# Patient Record
Sex: Male | Born: 1945
Health system: Southern US, Community
[De-identification: ages and names within clinical notes are randomized; demographics above are authoritative.]

## PROBLEM LIST (undated history)

## (undated) DIAGNOSIS — E119 Type 2 diabetes mellitus without complications: Secondary | ICD-10-CM

## (undated) DIAGNOSIS — Z951 Presence of aortocoronary bypass graft: Secondary | ICD-10-CM

## (undated) DIAGNOSIS — I2510 Atherosclerotic heart disease of native coronary artery without angina pectoris: Secondary | ICD-10-CM

## (undated) DIAGNOSIS — E785 Hyperlipidemia, unspecified: Secondary | ICD-10-CM

## (undated) DIAGNOSIS — Z8719 Personal history of other diseases of the digestive system: Secondary | ICD-10-CM

## (undated) DIAGNOSIS — I10 Essential (primary) hypertension: Secondary | ICD-10-CM

## (undated) HISTORY — DX: Atherosclerotic heart disease of native coronary artery without angina pectoris: I25.10

## (undated) HISTORY — PX: KNEE ARTHROSCOPY: SUR90

## (undated) HISTORY — DX: Essential (primary) hypertension: I10

## (undated) HISTORY — DX: Hyperlipidemia, unspecified: E78.5

## (undated) HISTORY — DX: Type 2 diabetes mellitus without complications: E11.9

---

## 2013-09-15 ENCOUNTER — Encounter: Payer: Self-pay | Admitting: Cardiovascular Disease

## 2013-09-15 ENCOUNTER — Ambulatory Visit (INDEPENDENT_AMBULATORY_CARE_PROVIDER_SITE_OTHER): Payer: Medicare Other | Admitting: Cardiovascular Disease

## 2013-09-15 VITALS — BP 150/80 | HR 67 | Ht 74.0 in | Wt 253.9 lb

## 2013-09-15 DIAGNOSIS — E785 Hyperlipidemia, unspecified: Secondary | ICD-10-CM

## 2013-09-15 DIAGNOSIS — R079 Chest pain, unspecified: Secondary | ICD-10-CM

## 2013-09-15 DIAGNOSIS — I10 Essential (primary) hypertension: Secondary | ICD-10-CM

## 2013-09-15 DIAGNOSIS — R0789 Other chest pain: Secondary | ICD-10-CM

## 2013-09-15 DIAGNOSIS — E119 Type 2 diabetes mellitus without complications: Secondary | ICD-10-CM

## 2013-09-15 NOTE — Patient Instructions (Signed)
Your physician has requested that you have an echocardiogram. Echocardiography is a painless test that uses sound waves to create images of your heart. It provides your doctor with information about the size and shape of your heart and how well your heart's chambers and valves are working. This procedure takes approximately one hour. There are no restrictions for this procedure.   Your physician recommends that you return for lab work fasting before your next appointment.  Your physician recommends that you schedule a follow-up appointment in: 3-4 weeks.

## 2013-09-15 NOTE — Progress Notes (Signed)
Patient ID: William Orr, male   DOB: 1945-11-18, 68 y.o.   MRN: [id number]     PATIENT PROFILE: William Orr is a 68 year old African American male who presents to the office to establish cardiology care with me and to further evaluate his recent left-sided chest pain.   HPI: William Orr is a 68 year old gentleman who is the wife of my patient Ms. Tollie Pizza are [employer]. Apparently William Orr has a greater than 20 year history of hypertension as well as type 2 diabetes mellitus in addition to hyperlipidemia. He recently had developed some what atypical chest pain and underwent an exercise nuclear study which was done in [place]. He was told that this was normal. I was able to obtain the results by fax today and apparently this confirmed a normal nuclear scan with an ejection fraction of 61%. There is no evidence for ischemia. It normal wall motion. He was hypertensive throughout the study.  William Orr has continued to experience left-sided chest pain. He states his pain often times is worse if he coughs or moves. It does not typically be precipitated by activity. He does note chest wall tenderness.  He also has a history of a hiatal hernia.  He has maintained on blood pressure medicines consisting of diltiazem 300 mg daily, carvedilol 25 mg twice a day, losartan HCT 100/25 mg. He takes simvastatin 20 mg for hyperlipidemia. He takes Glucophage 850 mg in the morning and 2 tablets at night. He also takes fish oil. He presents for evaluation.  Past Medical History  Diagnosis Date  . Hypertension   . Diabetes mellitus without complication   . Hyperlipidemia     History reviewed. No pertinent past surgical history.  No Known Allergies  Current Outpatient Prescriptions  Medication Sig Dispense Refill  . carvedilol (COREG) 25 MG tablet Take 1 tablet by mouth 2 (two) times daily.      . Cholecalciferol (VITAMIN D-3) 5000 UNITS TABS Take 1 tablet by mouth daily.      [name] diltiazem  (TIAZAC) 300 MG 24 hr capsule Take 300 mg by mouth daily.      . fluticasone (FLONASE) 50 MCG/ACT nasal spray Place 1 spray into both nostrils daily.      [name] losartan-hydrochlorothiazide (HYZAAR) 100-25 MG per tablet Take 1 tablet by mouth daily.      . metFORMIN (GLUCOPHAGE) 850 MG tablet Take by mouth. 1 tablet in the morning and 2 tablets in thePM      . Multiple Vitamins-Minerals (MULTIVITAMIN PO) Take 1 capsule by mouth daily.      . Omega-3 1000 MG CAPS Take 1 capsule by mouth 3 (three) times daily.      [name] OVER THE COUNTER MEDICATION Take 1 tablet by mouth 2 (two) times daily. superbeta  prostate      . simvastatin (ZOCOR) 20 MG tablet Take 1 tablet by mouth daily.       No current facility-administered medications for this visit.    Social history is notable he is married for 44 years. He has 2 children and 3 grandchildren. 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 There is no history of tobacco use. There is no alcohol use.  Family History  Problem Relation Age of Onset  . Diabetes Mother   . Cancer - Prostate Father   . Rheum arthritis Sister   . Diabetes Brother   . Cancer - Other Brother  pancreatic  . Rheum arthritis Brother   . Hypertension Son   . Diabetes type II Son     ROS is negative for fever chills or night sweats. He denies skin rash. He denies change in vision or hearing. He denies PND and orthopnea. There is no wheezing or increased sputum production. He does note chest wall discomfort and discomfort when he moves his left arm. He denies exertionally precipitated chest tightness. There is no nausea vomiting or diarrhea. He is unaware of blood in stool or urine. He denies claudication. He denies GU symptoms. He is unaware of blood in his. There is no claudication. He denies significant leg swelling. There is an almost 25 year history of diabetes mellitus. Other comprehensive 14  point system review is negative.  PE BP 150/80  Pulse 67  Ht 6\' 2"  (1.88 m)  Wt 253 lb 14.4 oz (115.168 kg)  BMI 32.58 kg/m2 General: Alert, oriented, no distress.  Skin: normal turgor, no rashes HEENT: Normocephalic, atraumatic. Pupils round and reactive; sclera anicteric; extraocular muscles intact; Fundi mild arterial narrowing without hemorrhages or exudates. No xanthelasmas. Nose without nasal septal hypertrophy Mouth/Parynx benign; Mallinpatti scale 3 Neck: No JVD, no carotid bruits; normal carotid upstroke Lungs: clear to ausculatation and percussion; no wheezing or rales Chest wall: Definite tenderness to palpation below the left clavicle in the region of the pectoralis muscle extending to the left shoulder. Tender to palpation. Heart: RRR, s1 s2 normal 1/6 systolic murmur. No S3 gallop. No rub or heaves to Abdomen: soft, nontender; no hepatosplenomehaly, BS+; abdominal aorta nontender and not dilated by palpation. Back: no CVA tenderness Pulses 2+ Extremities: no clubbinbg cyanosis or edema, Homan's sign negative  Neurologic: grossly nonfocal; Cranial nerves grossly wnl Psychologic: Normal mood and affect    ECG (independently read by me): Sinus rhythm at 67 beats per minute with borderline first-degree block with PR interval 212 ms.   LABS:  BMET No results found for this basename: na, k, cl, co2, glucose, bun, creatinine, calcium, gfrnonaa, gfraa     Hepatic Function Panel  No results found for this basename: prot, albumin, ast, alt, alkphos, bilitot, bilidir, ibili     CBC No results found for this basename: wbc, rbc, hgb, hct, plt, mcv, mch, mchc, rdw, neutrabs, lymphsabs, monoabs, eosabs, basosabs     BNP No results found for this basename: probnp    Lipid Panel  No results found for this basename: chol, trig, hdl, cholhdl, vldl, ldlcalc     RADIOLOGY: No results found.   ASSESSMENT AND PLAN: William Orr is a 68 year old African American male  who has cardiac risk factor profile notable for a greater than twenty-year history of hypertension and diabetes mellitus. There is a family history for coronary artery disease with his mother dying from myocardial infarction and who also diabetes mellitus at age 42. I did review the [facility]. His nuclear perfusion study was nonischemic and showed fairly normal perfusion without scar or ischemia. I suspect his chest pain is musculoskeletal in etiology. With his diabetes mellitus, hypertensive history I am recommending a 2-D echo Doppler study to evaluate hypertensive heart disease and to further evaluate systolic and diastolic function as well as valve architecture. I recommended a trial of Naprosyn 40 mg twice a day with meals for the next 5-7 days and then he can reduce this to 200 mg twice a day. I am recommending complete her laboratory be obtained. I will see him back in the office in  3-4 weeks for followup evaluation.   Troy Sine, MD, [facility] 09/15/2013 7:43 PM

## 2013-09-25 ENCOUNTER — Ambulatory Visit (HOSPITAL_COMMUNITY)
Admission: RE | Admit: 2013-09-25 | Discharge: 2013-09-25 | Disposition: A | Discharge status: Home / Self Care | Payer: Medicare Other | Source: Ambulatory Visit | Attending: Internal Medicine | Admitting: Internal Medicine

## 2013-09-25 DIAGNOSIS — E119 Type 2 diabetes mellitus without complications: Secondary | ICD-10-CM | POA: Insufficient documentation

## 2013-09-25 DIAGNOSIS — R072 Precordial pain: Secondary | ICD-10-CM | POA: Insufficient documentation

## 2013-09-25 DIAGNOSIS — R079 Chest pain, unspecified: Secondary | ICD-10-CM

## 2013-09-25 DIAGNOSIS — I10 Essential (primary) hypertension: Secondary | ICD-10-CM | POA: Insufficient documentation

## 2013-09-25 DIAGNOSIS — E785 Hyperlipidemia, unspecified: Secondary | ICD-10-CM | POA: Insufficient documentation

## 2013-09-25 NOTE — Progress Notes (Signed)
2D Echo Performed 09/25/2013    Catherina Pates, RCS  

## 2013-09-29 LAB — CBC
HCT: 38 % — ABNORMAL LOW (ref 39.0–52.0)
HEMOGLOBIN: 12.9 g/dL — AB (ref 13.0–17.0)
MCH: 30.8 pg (ref 26.0–34.0)
MCHC: 33.9 g/dL (ref 30.0–36.0)
MCV: 90.7 fL (ref 78.0–100.0)
Platelets: 207 10*3/uL (ref 150–400)
RBC: 4.19 MIL/uL — ABNORMAL LOW (ref 4.22–5.81)
RDW: 14.2 % (ref 11.5–15.5)
WBC: 4.5 10*3/uL (ref 4.0–10.5)

## 2013-09-30 LAB — LIPID PANEL
CHOLESTEROL: 84 mg/dL (ref 0–200)
HDL: 29 mg/dL — AB (ref >39)
LDL CALC: 14 mg/dL (ref 0–99)
TRIGLYCERIDES: 206 mg/dL — AB (ref <150)
Total CHOL/HDL Ratio: 2.9 Ratio
VLDL: 41 mg/dL — AB (ref 0–40)

## 2013-09-30 LAB — COMPREHENSIVE METABOLIC PANEL
ALK PHOS: 59 U/L (ref 39–117)
ALT: 29 U/L (ref 0–53)
AST: 23 U/L (ref 0–37)
Albumin: 4.2 g/dL (ref 3.5–5.2)
BUN: 16 mg/dL (ref 6–23)
CO2: 27 meq/L (ref 19–32)
CREATININE: 1.08 mg/dL (ref 0.50–1.35)
Calcium: 9.2 mg/dL (ref 8.4–10.5)
Chloride: 99 mEq/L (ref 96–112)
GLUCOSE: 170 mg/dL — AB (ref 70–99)
Potassium: 4.7 mEq/L (ref 3.5–5.3)
SODIUM: 135 meq/L (ref 135–145)
TOTAL PROTEIN: 6.5 g/dL (ref 6.0–8.3)
Total Bilirubin: 0.3 mg/dL (ref 0.2–1.2)

## 2013-09-30 LAB — HEMOGLOBIN A1C
HEMOGLOBIN A1C: 7 % — AB (ref <5.7)
Mean Plasma Glucose: 154 mg/dL — ABNORMAL HIGH (ref <117)

## 2013-09-30 LAB — TSH: TSH: 3.193 u[IU]/mL (ref 0.350–4.500)

## 2013-10-15 ENCOUNTER — Encounter: Payer: Self-pay | Admitting: Cardiovascular Disease

## 2013-10-15 ENCOUNTER — Ambulatory Visit (INDEPENDENT_AMBULATORY_CARE_PROVIDER_SITE_OTHER): Payer: Medicare Other | Admitting: Cardiovascular Disease

## 2013-10-15 VITALS — BP 148/70 | HR 80 | Ht 74.0 in | Wt 256.1 lb

## 2013-10-15 DIAGNOSIS — I10 Essential (primary) hypertension: Secondary | ICD-10-CM

## 2013-10-15 DIAGNOSIS — G4733 Obstructive sleep apnea (adult) (pediatric): Secondary | ICD-10-CM

## 2013-10-15 DIAGNOSIS — K449 Diaphragmatic hernia without obstruction or gangrene: Secondary | ICD-10-CM

## 2013-10-15 DIAGNOSIS — R0789 Other chest pain: Secondary | ICD-10-CM

## 2013-10-15 DIAGNOSIS — E119 Type 2 diabetes mellitus without complications: Secondary | ICD-10-CM

## 2013-10-15 DIAGNOSIS — R0609 Other forms of dyspnea: Secondary | ICD-10-CM

## 2013-10-15 DIAGNOSIS — G4730 Sleep apnea, unspecified: Secondary | ICD-10-CM

## 2013-10-15 DIAGNOSIS — E785 Hyperlipidemia, unspecified: Secondary | ICD-10-CM

## 2013-10-15 DIAGNOSIS — R0989 Other specified symptoms and signs involving the circulatory and respiratory systems: Secondary | ICD-10-CM

## 2013-10-15 NOTE — Patient Instructions (Signed)
Your physician has recommended that you have a sleep study. This test records several body functions during sleep, including: brain activity, eye movement, oxygen and carbon dioxide blood levels, heart rate and rhythm, breathing rate and rhythm, the flow of air through your mouth and nose, snoring, body muscle movements, and chest and belly movement.   Your physician recommends that you schedule a follow-up appointment in: 2-3 months in a sleep clinic. 

## 2013-10-19 ENCOUNTER — Encounter: Payer: Self-pay | Admitting: Cardiovascular Disease

## 2013-10-19 DIAGNOSIS — G4733 Obstructive sleep apnea (adult) (pediatric): Secondary | ICD-10-CM | POA: Insufficient documentation

## 2013-10-19 DIAGNOSIS — K449 Diaphragmatic hernia without obstruction or gangrene: Secondary | ICD-10-CM | POA: Insufficient documentation

## 2013-10-19 NOTE — Progress Notes (Signed)
Patient ID: William Orr, male   DOB: 05/30/1946, 68 y.o.   MRN: [id number]     HPI: William Orr is a 68 year old African American male who presents to the office for follow-up cardiology evaluation of his recent left-sided chest pain.  William Orr has a history of hypertension for greater than 20 years,  type 2 diabetes mellitus, and hyperlipidemia. He recently had developed some what atypical chest pain and underwent an exercise nuclear study which was done in [place]. He was told that this was normal. I was able to reviewed the results which confirmed a normal nuclear scan with an ejection fraction of 61%. There was no evidence for ischemia and he had normal wall motion. He was hypertensive throughout the study.  William Orr has continued to experience left-sided chest pain. He states his pain often times is worse if he coughs or moves. It does not typically be precipitated by activity. He does note chest wall tenderness.  He also has a history of a hiatal hernia.  He had maintained on blood pressure medicines consisting of diltiazem 300 mg daily, carvedilol 25 mg twice a day, losartan HCT 100/25 mg. He takes simvastatin 20 mg for hyperlipidemia. He takes Glucophage 850 mg in the morning and 2 tablets at night. He also takes fish oil. I had seen him for initial cardiology evaluation on 09/15/2013. That his chest pain most likely was non-cardiac and probably musculoskeletal for which he did take nonsteroidal anti-inflammatory treatment. He underwent a 2-D echo Doppler study on 09/25/2013 and this revealed an ejection fraction of 60-65% with mild focal basal hypertrophy of the septum had normal diastolic parameters. There was moderate thickening of the trileaflet aortic valve consistent with aortic valve sclerosis. He had mild left atrial dilatation. His right ventricle was upper normal to mildly dilated.  Upon further questioning, William Orr does admit to significant fatigue and tiredness.  He does snore. He feels that he can take a nap in the afternoon if circumstances permit. His wife was recently diagnosed with obstructive sleep apnea he typically goes to bed between 11 PM and midnight and wakes up at 9 AM but is still fatigued and tired. His sleep is nonrestorative.  Past Medical History  Diagnosis Date  . Hypertension   . Diabetes mellitus without complication   . Hyperlipidemia     History reviewed. No pertinent past surgical history.  No Known Allergies  Current Outpatient Prescriptions  Medication Sig Dispense Refill  . carvedilol (COREG) 25 MG tablet Take 1 tablet by mouth 2 (two) times daily.      . Cholecalciferol (VITAMIN D-3) 5000 UNITS TABS Take 1 tablet by mouth daily.      [name] diltiazem (TIAZAC) 300 MG 24 hr capsule Take 300 mg by mouth daily.      . fluticasone (FLONASE) 50 MCG/ACT nasal spray Place 1 spray into both nostrils daily.      [name] losartan-hydrochlorothiazide (HYZAAR) 100-25 MG per tablet Take 1 tablet by mouth daily.      . metFORMIN (GLUCOPHAGE) 850 MG tablet Take by mouth. 1 tablet in the morning and 2 tablets in thePM      . Multiple Vitamins-Minerals (MULTIVITAMIN PO) Take 1 capsule by mouth daily.      . Omega-3 1000 MG CAPS Take 1 capsule by mouth 3 (three) times daily.      [name] OVER THE COUNTER MEDICATION Take 1 tablet by mouth 2 (two) times daily. superbeta  prostate      . simvastatin (  ZOCOR) 20 MG tablet Take 1 tablet by mouth daily.       No current facility-administered medications for this visit.    Social history is notable he is married for 44 years. He has 2 children and 3 grandchildren. 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 There is no history of tobacco use. There is no alcohol use.  Family History  Problem Relation Age of Onset  . Diabetes Mother   . Cancer - Prostate Father   . Rheum arthritis Sister   . Diabetes Brother   . Cancer  - Other Brother     pancreatic  . Rheum arthritis Brother   . Hypertension Son   . Diabetes type II Son     ROS is negative for fever chills or night sweats. He denies skin rash. He denies change in vision or hearing. He denies PND and orthopnea. There is no wheezing or increased sputum production. He does note chest wall discomfort and discomfort when he moves his left arm but this did improve with his nonsteroidal anti-inflammatory therapy to. He denies exertionally precipitated chest tightness. There is no nausea vomiting or diarrhea. He is unaware of blood in stool or urine. He denies claudication. He denies GU symptoms. He is unaware of blood in his. There is no claudication. He denies significant leg swelling. There is an almost 25 year history of diabetes mellitus. He does snore. Sleep is nonrestorative. He is unaware of bruxism or restless legs. He does note potential for daytime napping. Other comprehensive 14 point system review is negative.  PE BP 148/70  Pulse 80  Ht 6\' 2"  (1.88 m)  Wt 256 lb 1.6 oz (116.166 kg)  BMI 32.87 kg/m2 General: Alert, oriented, no distress.  Skin: normal turgor, no rashes HEENT: Normocephalic, atraumatic. Pupils round and reactive; sclera anicteric; extraocular muscles intact; Fundi mild arterial narrowing without hemorrhages or exudates. No xanthelasmas. Nose without nasal septal hypertrophy Mouth/Parynx benign; Mallinpatti scale 3 Neck: No JVD, no carotid bruits; normal carotid upstroke Lungs: clear to ausculatation and percussion; no wheezing or rales Chest wall: Definite tenderness to palpation below the left clavicle in the region of the pectoralis muscle extending to the left shoulder. Tender to palpation. Heart: RRR, s1 s2 normal 1/6 systolic murmur. No S3 gallop. No rub or heaves to Abdomen: soft, nontender; no hepatosplenomehaly, BS+; abdominal aorta nontender and not dilated by palpation. Back: no CVA tenderness Pulses 2+ Extremities: no  clubbinbg cyanosis or edema, Homan's sign negative  Neurologic: grossly nonfocal; Cranial nerves grossly wnl Psychologic: Normal mood and affect    Previous ECG from 09/15/13 (independently read by me): Sinus rhythm at 67 beats per minute with borderline first-degree block with PR interval 212 ms.   LABS:  BMET    Component Value Date/Time   NA 135 09/29/2013 1027     Hepatic Function Panel     Component Value Date/Time   PROT 6.5 09/29/2013 1027     CBC    Component Value Date/Time   WBC 4.5 09/29/2013 1027     BNP No results found for this basename: probnp    Lipid Panel     Component Value Date/Time   CHOL 84 09/29/2013 1027     RADIOLOGY: No results found.   ASSESSMENT AND PLAN: William Orr is a 68 year old African American male who has cardiac risk factor profile notable for a greater than twenty-year history  of hypertension and diabetes mellitus. There is a family history for coronary artery disease with his mother dying from myocardial infarction and who also diabetes mellitus at age 33.  His nuclear perfusion study was nonischemic and showed fairly normal perfusion without scar or ischemia. I suspect his chest pain is musculoskeletal in etiology and this did improve with nonsteroidal anti-inflammatory therapy to. With his diabetes mellitus, hypertensive history I  recommended a 2-D echo Doppler study to evaluate hypertensive heart disease and to further evaluate systolic and diastolic function as well as valve architecture. Review the echo findings with him in detail today and this does suggest basal hypertrophy with normal systolic and diastolic function without regional wall motion abnormalities. He did have aortic valve sclerosis. There is mild RA dilatation and mild LA dilatation. He does have a history of hiatal hernia. He also has a history of hyperlipidemia with high triglyceride levels and low HDL level consistent with atherogenic dyslipidemia pattern. He has  been maintained on simvastatin 20 mg. Losartan 100/25 as well as diltiazem 300 mg and carvedilol 25 mg twice a day for blood pressure control. He is on metformin 850 in the morning and 2 tablets in the evening. His history is also suggestive of possible obstructive sleep apnea. With this cardiovascular comorbidities, also scheduling him for a diagnostic polysomnogram for further evaluation. If he is positive for sleep apnea CPAP titration trial will be formed I will see him back in the office in the sleep clinic.  Troy Sine, MD, [facility] 10/19/2013 11:22 AM

## 2014-02-19 ENCOUNTER — Telehealth: Payer: Self-pay | Admitting: *Deleted

## 2014-02-19 NOTE — Telephone Encounter (Signed)
Returned CPAP re certification questionnaire to [place].

## 2014-02-19 NOTE — Telephone Encounter (Signed)
Returned CPAP supply order to [place],

## 2014-03-19 ENCOUNTER — Encounter: Payer: Self-pay | Admitting: Cardiovascular Disease

## 2014-03-19 ENCOUNTER — Telehealth: Payer: Self-pay | Admitting: Cardiovascular Disease

## 2014-03-19 NOTE — Telephone Encounter (Signed)
Closed encounter °

## 2014-04-10 ENCOUNTER — Telehealth: Payer: Self-pay | Admitting: *Deleted

## 2014-04-10 ENCOUNTER — Encounter: Payer: Self-pay | Admitting: Cardiovascular Disease

## 2014-04-10 ENCOUNTER — Ambulatory Visit (INDEPENDENT_AMBULATORY_CARE_PROVIDER_SITE_OTHER): Payer: Medicare Other | Admitting: Cardiovascular Disease

## 2014-04-10 VITALS — BP 129/66 | HR 57 | Ht 74.0 in | Wt 253.5 lb

## 2014-04-10 DIAGNOSIS — I10 Essential (primary) hypertension: Secondary | ICD-10-CM

## 2014-04-10 DIAGNOSIS — G4733 Obstructive sleep apnea (adult) (pediatric): Secondary | ICD-10-CM

## 2014-04-10 DIAGNOSIS — E119 Type 2 diabetes mellitus without complications: Secondary | ICD-10-CM

## 2014-04-10 DIAGNOSIS — E785 Hyperlipidemia, unspecified: Secondary | ICD-10-CM

## 2014-04-10 DIAGNOSIS — R0789 Other chest pain: Secondary | ICD-10-CM

## 2014-04-10 NOTE — Patient Instructions (Addendum)
Your physician wants you to follow-up in: 1 year  For your sleep therapy.You will receive a reminder letter in the mail two months in advance. If you don't receive a letter, please call our office to schedule the follow-up appointment.

## 2014-04-10 NOTE — Telephone Encounter (Signed)
Returned CPAP  recertification order to [place].

## 2014-04-12 ENCOUNTER — Encounter: Payer: Self-pay | Admitting: Cardiovascular Disease

## 2014-04-12 NOTE — Progress Notes (Signed)
Patient ID: William Orr, male   DOB: [birth year], 68 y.o.   MRN: [id number]     HPI: William Orr is a 68 year old African American male who presents to the office for a sleep clinic evaluation following initiation of CPAP therapy.  [name] has a history of hypertension for greater than 20 years,  type 2 diabetes mellitus, and hyperlipidemia. He recently had developed some what atypical chest pain and underwent an exercise nuclear study which was done in [place]. He was told that this was normal. I was able to reviewed the results which confirmed a normal nuclear scan with an ejection fraction of 61%. There was no evidence for ischemia and he had normal wall motion. He was hypertensive throughout the study.  He has been maintained on blood pressure medicines consisting of diltiazem 300 mg daily, carvedilol 25 mg twice a day, losartan HCT 100/25 mg. He takes simvastatin 20 mg for hyperlipidemia. He takes Glucophage 850 mg in the morning and 2 tablets at night. He also takes fish oil. I had seen him for initial cardiology evaluation on 09/15/2013. That his chest pain most likely was non-cardiac and probably musculoskeletal for which he did take nonsteroidal anti-inflammatory treatment. He underwent a 2-D echo Doppler study on 09/25/2013 and this revealed an ejection fraction of 60-65% with mild focal basal hypertrophy of the septum had normal diastolic parameters. There was moderate thickening of the trileaflet aortic valve consistent with aortic valve sclerosis. He had mild left atrial dilatation. His right ventricle was upper normal to mildly dilated.  When I last saw William Orr I was concerned that he may very well have obstructive sleep apnea , which may be playing a role in his hypertension, as well as in his significant fatigue and tiredness. He does snore. He feels that he can take a nap in the afternoon if circumstances permit. His wife was recently diagnosed with obstructive sleep apnea  he typically goes to bed between 11 PM and midnight and wakes up at 9 AM but is still fatigued and tired. His sleep is nonrestorative.   He underwent a diagnostic polysomnogram in March 2015 which revealed mild sleep apnea overall, with an AHI of 5.8, but he had moderate sleep apnea during REM sleep with an AHI of 25/hr.  He also had significant oxygen desaturation to 84% with non-REM sleep and 76% with REM sleep and had evidence for very loud snoring.  His periodic limb movement index was 6.7 per hour with 9.5 per hour to arousal.  He underwent CPAP titration and 11 cm pressure was recommended.  He's been using an air since 10 AutoSet resume.  A download from 03/11/2014 through 04/09/2014 reveals that he is meeting Medicare compliance with 100% days of the device usage.  97% of the time was used at greater than 4 hours.  With his current setting, AHI is excellent at 1.0.  His were index was 0.4.  He's using a fullface mask.  His MDE company is [employer] in [place], [place].  Subjectively, since initiating therapy.  He feels markedly improved.  He denies daytime sleepiness.  He denies breakthrough snoring.  His sleep is more restorative.  He has more energy.  An Epworth Sleepiness Scale score was calculated today and is this endorsed at 1 arguing against hypersomnolence.  Past Medical History  Diagnosis Date  . Hypertension   . Diabetes mellitus without complication   . Hyperlipidemia     History reviewed. No pertinent past surgical history.  No Known  Allergies  Current Outpatient Prescriptions  Medication Sig Dispense Refill  . carvedilol (COREG) 25 MG tablet Take 1 tablet by mouth 2 (two) times daily.      . Cholecalciferol (VITAMIN D-3) 5000 UNITS TABS Take 1 tablet by mouth daily.      [name] diltiazem (TIAZAC) 300 MG 24 hr capsule Take 300 mg by mouth daily.      . fluticasone (FLONASE) 50 MCG/ACT nasal spray Place 1 spray into both nostrils daily.      [name] glipiZIDE (GLUCOTROL) 10 MG tablet  Take 10 mg by mouth 2 (two) times daily.      [name] losartan-hydrochlorothiazide (HYZAAR) 100-25 MG per tablet Take 1 tablet by mouth daily.      . metFORMIN (GLUCOPHAGE) 850 MG tablet Take by mouth. 1 tablet in the morning and 2 tablets in thePM      . Multiple Vitamins-Minerals (MULTIVITAMIN PO) Take 1 capsule by mouth daily.      . Omega-3 1000 MG CAPS Take 1 capsule by mouth 3 (three) times daily.      [name] omeprazole (PRILOSEC) 20 MG capsule Take 20 mg by mouth as needed.      [name] OVER THE COUNTER MEDICATION Take 1 tablet by mouth 2 (two) times daily. superbeta  prostate      . simvastatin (ZOCOR) 20 MG tablet Take 1 tablet by mouth daily.       No current facility-administered medications for this visit.    Social history is notable he is married for 44 years. He has 2 children and 3 grandchildren. 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 There is no history of tobacco use. There is no alcohol use.  Family History  Problem Relation Age of Onset  . Diabetes Mother   . Cancer - Prostate Father   . Rheum arthritis Sister   . Diabetes Brother   . Cancer - Other Brother     pancreatic  . Rheum arthritis Brother   . Hypertension Son   . Diabetes type II Son    ROS General: Negative; No fevers, chills, or night sweats;  HEENT: Negative; No changes in vision or hearing, sinus congestion, difficulty swallowing Pulmonary: Negative; No cough, wheezing, shortness of breath, hemoptysis Cardiovascular: Occasional atypical chest pain; No presyncope, syncope, palpitations GI: Positive for hiatal hernia; No nausea, vomiting, diarrhea, or abdominal pain GU: Negative; No dysuria, hematuria, or difficulty voiding Musculoskeletal: Negative; no myalgias, joint pain, or weakness Hematologic/Oncology: Negative; no easy bruising, bleeding Endocrine: Positive for diabetes; no heat/cold intolerance; Neuro: Negative; no  changes in balance, headaches Skin: Negative; No rashes or skin lesions Psychiatric: Negative; No behavioral problems, depression Sleep: See history of present illness; since starting CPAP therapy no snoring, daytime sleepiness, hypersomnolence, bruxism, restless legs, hypnogognic hallucinations, no cataplexy Other comprehensive 14 point system review is negative.   PE BP 129/66  Pulse 57  Ht 6\' 2"  (1.88 m)  Wt 253 lb 8 oz (114.987 kg)  BMI 32.53 kg/m2 General: Alert, oriented, no distress.  Skin: normal turgor, no rashes HEENT: Normocephalic, atraumatic. Pupils round and reactive; sclera anicteric; extraocular muscles intact; Fundi mild arterial narrowing without hemorrhages or exudates. No xanthelasmas. Nose without nasal septal hypertrophy Mouth/Parynx benign; Mallinpatti scale 3 Neck: No JVD, no carotid bruits; normal carotid upstroke Lungs: clear to ausculatation and percussion; no wheezing or rales Chest wall: Definite tenderness to palpation below the left clavicle in the  region of the pectoralis muscle extending to the left shoulder. Tender to palpation. Heart: RRR, s1 s2 normal 1/6 systolic murmur. No S3 gallop. No rub or heaves to Abdomen: soft, nontender; no hepatosplenomehaly, BS+; abdominal aorta nontender and not dilated by palpation. Back: no CVA tenderness Pulses 2+ Extremities: no clubbinbg cyanosis or edema, Homan's sign negative  Neurologic: grossly nonfocal; Cranial nerves grossly wnl Psychologic: Normal mood and affect    Previous ECG from 09/15/13 (independently read by me): Sinus rhythm at 67 beats per minute with borderline first-degree block with PR interval 212 ms.   LABS:  BMET    Component Value Date/Time   NA 135 09/29/2013 1027     Hepatic Function Panel     Component Value Date/Time   PROT 6.5 09/29/2013 1027     CBC    Component Value Date/Time   WBC 4.5 09/29/2013 1027     BNP No results found for this basename: probnp    Lipid  Panel     Component Value Date/Time   CHOL 84 09/29/2013 1027     RADIOLOGY: No results found.   ASSESSMENT AND PLAN: Mr. Beske is a 68 year old African American male who has cardiac risk factor profile notable for long-standing  hypertension and diabetes mellitus. There is a family history for coronary artery disease with his mother dying from myocardial infarction and who also diabetes mellitus at age 65.  His nuclear perfusion study revealed normal perfusion.  A  2-D echo suggested basal hypertrophy with normal systolic and diastolic function without regional wall motion abnormalities. He did have aortic valve sclerosis. There is mild RA dilatation and mild LA dilatation. He does have a history of hiatal hernia. He also has a history of hyperlipidemia with high triglyceride levels and low HDL level consistent with atherogenic dyslipidemia pattern. He has been maintained on simvastatin 20 mg. Losartan 100/25 as well as diltiazem 300 mg and carvedilol 25 mg twice a day for blood pressure control. He is on metformin 850 in the morning and 2 tablets in the evening. In light of his significant cardiovascular comorbidities and clinical history suggestive of OSA he underwent evaluation and was found to have mild sleep apnea overall, but moderate sleep apnea with REM sleep.  There was significant oxygen desaturation to 76% with REM sleep and there was evidence for significant loud snoring.  Clinically, he feels significantly improved since initiating CPAP therapy.  His Epworth Sleepiness Scale score is negative for hypersomnolence.  I reviewed his download in detail with him.  He is meeting Medicare compliance standards with 100% daily use and 97%.  Today is greater than 4 hours.  This current set pressure of 11 cm, his AHI is excellent.  I answered all his questions.  He will be getting his supplies from [facility] in [place], [place], since this is close to his home.  I will see him in one year for  cardiology anad sleep clinic for evaluation. Troy Sine, MD, [facility] 04/12/2014 10:40 AM

## 2014-04-21 ENCOUNTER — Encounter: Payer: Self-pay | Admitting: Cardiovascular Disease

## 2015-08-19 DIAGNOSIS — M9902 Segmental and somatic dysfunction of thoracic region: Secondary | ICD-10-CM | POA: Diagnosis not present

## 2015-08-19 DIAGNOSIS — M9901 Segmental and somatic dysfunction of cervical region: Secondary | ICD-10-CM | POA: Diagnosis not present

## 2015-08-19 DIAGNOSIS — M9903 Segmental and somatic dysfunction of lumbar region: Secondary | ICD-10-CM | POA: Diagnosis not present

## 2015-08-20 ENCOUNTER — Encounter: Payer: Self-pay | Admitting: Cardiovascular Disease

## 2015-08-20 ENCOUNTER — Ambulatory Visit (INDEPENDENT_AMBULATORY_CARE_PROVIDER_SITE_OTHER): Payer: Medicare Other | Admitting: Cardiovascular Disease

## 2015-08-20 VITALS — BP 124/70 | HR 62 | Ht 74.0 in | Wt 252.2 lb

## 2015-08-20 DIAGNOSIS — E785 Hyperlipidemia, unspecified: Secondary | ICD-10-CM | POA: Diagnosis not present

## 2015-08-20 DIAGNOSIS — I493 Ventricular premature depolarization: Secondary | ICD-10-CM

## 2015-08-20 DIAGNOSIS — I10 Essential (primary) hypertension: Secondary | ICD-10-CM | POA: Diagnosis not present

## 2015-08-20 DIAGNOSIS — Z794 Long term (current) use of insulin: Secondary | ICD-10-CM

## 2015-08-20 DIAGNOSIS — E119 Type 2 diabetes mellitus without complications: Secondary | ICD-10-CM

## 2015-08-20 DIAGNOSIS — R0789 Other chest pain: Secondary | ICD-10-CM

## 2015-08-20 DIAGNOSIS — Z79899 Other long term (current) drug therapy: Secondary | ICD-10-CM

## 2015-08-20 DIAGNOSIS — G4733 Obstructive sleep apnea (adult) (pediatric): Secondary | ICD-10-CM

## 2015-08-20 NOTE — Progress Notes (Signed)
Patient ID: Judith Campillo, male   DOB: 12-28-1945, 70 y.o.   MRN: [id number]     HPI: Mr. Gurney Balthazor is a 70 year old African American male who presents to the office for a  17 month follow-up cardiology evaluation.  Mr. Tinnel  has a history of hypertension for greater than 20 years,  type 2 diabetes mellitus, and hyperlipidemia. He  underwent an exercise nuclear study in [place] for atypical chest pain which revealed normal perfusion with an ejection fraction of 61%. There was no evidence for ischemia and he had normal wall motion. He was hypertensive throughout the study.  He has been maintained on blood pressure medicines consisting of diltiazem 300 mg daily, carvedilol 25 mg twice a day, losartan HCT 100/25 mg. He previously, his only had taken simvastatin 20 mg for hyperlipidemia  But discontinued this sometime since I last saw him. Recently, he has been taking over-the-counter supplements with fair amount of omega-3 fatty acids for elevated triglycerides.[name] He takes Glucophage 850 mg in the morning and 2 tablets at night.   When I initially saw him I felt that his chest pain most likely was non-cardiac and probably musculoskeletal for which he did take nonsteroidal anti-inflammatory treatment. He underwent a 2-D echo Doppler study on 09/25/2013 and this revealed an ejection fraction of 60-65% with mild focal basal hypertrophy of the septum had normal diastolic parameters. There was moderate thickening of the trileaflet aortic valve consistent with aortic valve sclerosis. He had mild left atrial dilatation. His right ventricle was upper normal to mildly dilated.  When I  saw Mr. Micheals I was concerned that he may very well have obstructive sleep apnea , which may be playing a role in his hypertension, as well as in his significant fatigue and tiredness. He does snore. He feels that he can take a nap in the afternoon if circumstances permit. His wife was recently diagnosed with obstructive  sleep apnea he typically goes to bed between 11 PM and midnight and wakes up at 9 AM but is still fatigued and tired. His sleep is nonrestorative.   He underwent a diagnostic polysomnogram in March 2015 which revealed mild sleep apnea overall, with an AHI of 5.8, but he had moderate sleep apnea during REM sleep with an AHI of 25/hr.  He also had significant oxygen desaturation to 84% with non-REM sleep and 76% with REM sleep and had evidence for very loud snoring.  His periodic limb movement index was 6.7 per hour with 9.5 per hour to arousal.  He underwent CPAP titration and 11 cm pressure was recommended.  He's been using an air since 10 AutoSet resume.  A download from 03/11/2014 through 04/09/2014 reveals that he is meeting Medicare compliance with 100% days of the device usage.  97% of the time was used at greater than 4 hours.  With his current setting, AHI is excellent at 1.0.  His were index was 0.4.  He's using a fullface mask.  His DME company is [employer] in [place], [place].  Subjectively, since initiating therapy.  He feels markedly improved.  He denies daytime sleepiness.  He denies breakthrough snoring.  His sleep is more restorative.  He has more energy.  He denies hypersomnolence.   Over the last year, he has continued to use CPAP but states there are occasional nights where he falls asleep before putting on his CPAP unit.  He does not sleep as well most nights and oftentimes wakes up several more times to go to the  bathroom. Recently, he has noticed that he may experience some increased palpitations in the morning.  He has not been able to correlate if this is related to potential night sweats.  He is not using CPAP. He continues to drink tea.  He has not had a recent download to CPAP unit.  He has begun using a central nutrient packets, several additional over-the-counter products which I reviewed today.  He presents for evaluation.   Past Medical History  Diagnosis Date  . Hypertension    . Diabetes mellitus without complication ([place])   . Hyperlipidemia     No past surgical history on file.  Allergies  Allergen Reactions  . Lisinopril Cough    Current Outpatient Prescriptions  Medication Sig Dispense Refill  . Alpha-Lipoic Acid 600 MG CAPS Take 1 capsule by mouth daily.    . Ashwagandha 500 MG CAPS Take 1 capsule by mouth daily.    . carvedilol (COREG) 25 MG tablet Take 1 tablet by mouth 2 (two) times daily.    . Cholecalciferol (VITAMIN D-3) 5000 UNITS TABS Take 1 tablet by mouth daily.    [name] diltiazem (TIAZAC) 300 MG 24 hr capsule Take 300 mg by mouth daily.    . fluticasone (FLONASE) 50 MCG/ACT nasal spray Place 1 spray into both nostrils daily.    [name] glipiZIDE (GLUCOTROL) 10 MG tablet Take 10 mg by mouth 2 (two) times daily.    [name] losartan-hydrochlorothiazide (HYZAAR) 100-25 MG per tablet Take 1 tablet by mouth daily.    . Lutein 6 MG CAPS Take 1 capsule by mouth daily.    . Magnesium 500 MG CAPS Take 1 capsule by mouth daily.    . metFORMIN (GLUCOPHAGE) 850 MG tablet Take by mouth. 1 tablet in the morning and 2 tablets in thePM    . Multiple Vitamins-Minerals (MULTIVITAMIN PO) Take 1 capsule by mouth daily.    . Omega-3 1000 MG CAPS Take 1 capsule by mouth 3 (three) times daily.    [name] omeprazole (PRILOSEC) 20 MG capsule Take 20 mg by mouth as needed.    [name] OVER THE COUNTER MEDICATION Take 1 tablet by mouth 2 (two) times daily. superbeta  prostate     No current facility-administered medications for this visit.    Social history is notable he is married for 44 years. He has 2 children and 3 grandchildren. 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 There is no history of tobacco use. There is no alcohol use.  Family History  Problem Relation Age of Onset  . Diabetes Mother   . Cancer - Prostate Father   . Rheum arthritis Sister   . Diabetes Brother   . Cancer - Other Brother      pancreatic  . Rheum arthritis Brother   . Hypertension Son   . Diabetes type II Son    ROS General: Negative; No fevers, chills, or night sweats;  HEENT: Negative; No changes in vision or hearing, sinus congestion, difficulty swallowing Pulmonary: Negative; No cough, wheezing, shortness of breath, hemoptysis Cardiovascular: Occasional atypical chest pain; No presyncope, syncope, palpitations GI: Positive for hiatal hernia; No nausea, vomiting, diarrhea, or abdominal pain GU: Negative; No dysuria, hematuria, or difficulty voiding Musculoskeletal: Negative; no myalgias, joint pain, or weakness Hematologic/Oncology: Negative; no easy bruising, bleeding Endocrine: Positive for diabetes; no heat/cold intolerance; Neuro: Negative; no changes in balance, headaches Skin: Negative; No rashes or skin lesions  Psychiatric: Negative; No behavioral problems, depression Sleep: since starting CPAP therapy no snoring, daytime sleepiness, hypersomnolence, bruxism, restless legs, hypnogognic hallucinations, no cataplexy Other comprehensive 14 point system review is negative.   PE BP 124/70 mmHg  Pulse 62  Ht '6\' 2"'$  (1.88 m)  Wt 252 lb 3.2 oz (114.397 kg)  BMI 32.37 kg/m2   Wt Readings from Last 3 Encounters:  08/20/15 252 lb 3.2 oz (114.397 kg)  04/10/14 253 lb 8 oz (114.987 kg)  10/15/13 256 lb 1.6 oz (116.166 kg)   General: Alert, oriented, no distress.  Skin: normal turgor, no rashes HEENT: Normocephalic, atraumatic. Pupils round and reactive; sclera anicteric; extraocular muscles intact; Fundi mild arterial narrowing without hemorrhages or exudates. No xanthelasmas. Nose without nasal septal hypertrophy Mouth/Parynx benign; Mallinpatti scale 3 Neck: No JVD, no carotid bruits; normal carotid upstroke Lungs: clear to ausculatation and percussion; no wheezing or rales Chest wall:  Resolution of prior pectoralis muscle tenderness Heart: RRR, s1 s2 normal 1/6 systolic murmur. No S3 gallop.  No rub or heaves to Abdomen: soft, nontender; no hepatosplenomehaly, BS+; abdominal aorta nontender and not dilated by palpation. Back: no CVA tenderness Pulses 2+ Extremities: no clubbinbg cyanosis or edema, Homan's sign negative  Neurologic: grossly nonfocal; Cranial nerves grossly wnl Psychologic: Normal mood and affect  ECG (independently read by me):  Normal sinus rhythm at 62 with an isolated PVC.  QTC 410 ms.  First degree AV block with a PR interval 226.  Previous ECG from 09/15/13 (independently read by me): Sinus rhythm at 67 beats per minute with borderline first-degree block with PR interval 212 ms.   LABS:  BMP Latest Ref Rng 09/29/2013  Glucose 70 - 99 mg/dL 170(H)  BUN 6 - 23 mg/dL 16  Creatinine 0.50 - 1.35 mg/dL 1.08  Sodium 135 - 145 mEq/L 135  Potassium 3.5 - 5.3 mEq/L 4.7  Chloride 96 - 112 mEq/L 99  CO2 19 - 32 mEq/L 27  Calcium 8.4 - 10.5 mg/dL 9.2   Hepatic Function Latest Ref Rng 09/29/2013  Total Protein 6.0 - 8.3 g/dL 6.5  Albumin 3.5 - 5.2 g/dL 4.2  AST 0 - 37 U/L 23  ALT 0 - 53 U/L 29  Alk Phosphatase 39 - 117 U/L 59  Total Bilirubin 0.2 - 1.2 mg/dL 0.3   CBC Latest Ref Rng 09/29/2013  WBC 4.0 - 10.5 K/uL 4.5  Hemoglobin 13.0 - 17.0 g/dL 12.9(L)  Hematocrit 39.0 - 52.0 % 38.0(L)  Platelets 150 - 400 K/uL 207   Lab Results  Component Value Date   MCV 90.7 09/29/2013   Lab Results  Component Value Date   TSH 3.193 09/29/2013   Lab Results  Component Value Date   HGBA1C 7.0* 09/29/2013   Lipid Panel     Component Value Date/Time   CHOL 84 09/29/2013 1027   TRIG 206* 09/29/2013 1027   HDL 29* 09/29/2013 1027   CHOLHDL 2.9 09/29/2013 1027   VLDL 41* 09/29/2013 1027   LDLCALC 14 09/29/2013 1027     RADIOLOGY: No results found.   ASSESSMENT AND PLAN: Mr. Stonebraker is a 69 year old African American male who has cardiac risk factor profile notable for long-standing  hypertension and diabetes mellitus. There is a family history for  coronary artery disease with his mother dying from myocardial infarction and who also diabetes mellitus at age 75.  His nuclear perfusion study revealed normal perfusion.  A  2-D echo suggested basal hypertrophy with normal systolic and diastolic function without regional  wall motion abnormalities, aortic valve sclerosis, mild RA dilatation and mild LA dilatation. He does have a history of hiatal hernia. He also has a history of hyperlipidemia with high triglyceride levels and low HDL level consistent with atherogenic dyslipidemia pattern.  His blood pressure today is stable on his current regimen consisting of losartan HCT 100/25 , diltiazem 300 mg daily at bedtime, and carvedilol 25 mg twice a day. He is diabetic on metformin.  [place] on he stopped taking simvastatin.  He has been taking over-the-counter products which have significant amount of omega-3 fatty acids.  I have recommended he not take the LA 3 product.  We also discussed the discontinuance of caffeine due to his sensation of palpitations.  He's not had a download of his CPAP unit.  Since 2015 and I have recommended a follow-up download be obtained.  I'm scheduled him for a 2-D echo Doppler study to reevaluate systolic and diastolic function.  I am also scheduling him for fasting laboratory including CBC, magnesium, TSH, Cmet and lipid studies.  I will see him back in the office in 3-4 months for follow-up evaluation.   Troy Sine, MD, [facility] 08/20/2015 6:03 PM

## 2015-08-20 NOTE — Patient Instructions (Addendum)
Your physician recommends that you return for lab fasting work.  Your physician wants you to follow-up in: 3-4 MONTHS   Your physician has requested that you have an echocardiogram. Echocardiography is a painless test that uses sound waves to create images of your heart. It provides your doctor with information about the size and shape of your heart and how well your heart's chambers and valves are working. This procedure takes approximately one hour. There are no restrictions for this procedure.    If you need a refill on your cardiac medications before your next appointment, please call your pharmacy.  AVOID CAFFEINE

## 2015-08-27 ENCOUNTER — Other Ambulatory Visit: Payer: Self-pay | Admitting: Cardiovascular Disease

## 2015-08-27 ENCOUNTER — Ambulatory Visit (HOSPITAL_COMMUNITY)
Admission: RE | Admit: 2015-08-27 | Discharge: 2015-08-27 | Disposition: A | Discharge status: Home / Self Care | Payer: Medicare Other | Source: Ambulatory Visit | Attending: Cardiovascular Disease | Admitting: Cardiovascular Disease

## 2015-08-27 DIAGNOSIS — E119 Type 2 diabetes mellitus without complications: Secondary | ICD-10-CM | POA: Diagnosis not present

## 2015-08-27 DIAGNOSIS — E785 Hyperlipidemia, unspecified: Secondary | ICD-10-CM | POA: Insufficient documentation

## 2015-08-27 DIAGNOSIS — I10 Essential (primary) hypertension: Secondary | ICD-10-CM | POA: Diagnosis not present

## 2015-08-27 DIAGNOSIS — Z79899 Other long term (current) drug therapy: Secondary | ICD-10-CM | POA: Diagnosis not present

## 2015-08-27 DIAGNOSIS — I493 Ventricular premature depolarization: Secondary | ICD-10-CM | POA: Diagnosis not present

## 2015-08-27 DIAGNOSIS — Z794 Long term (current) use of insulin: Secondary | ICD-10-CM | POA: Diagnosis not present

## 2015-08-28 LAB — COMPREHENSIVE METABOLIC PANEL
ALBUMIN: 4.3 g/dL (ref 3.6–4.8)
ALT: 16 IU/L (ref 0–44)
AST: 17 IU/L (ref 0–40)
Albumin/Globulin Ratio: 1.5 (ref 1.1–2.5)
Alkaline Phosphatase: 63 IU/L (ref 39–117)
BUN / CREAT RATIO: 16 (ref 10–22)
BUN: 15 mg/dL (ref 8–27)
Bilirubin Total: 0.3 mg/dL (ref 0.0–1.2)
CALCIUM: 9.9 mg/dL (ref 8.6–10.2)
CO2: 23 mmol/L (ref 18–29)
CREATININE: 0.91 mg/dL (ref 0.76–1.27)
Chloride: 97 mmol/L (ref 96–106)
GFR calc Af Amer: 100 mL/min/{1.73_m2} (ref >59)
GFR, EST NON AFRICAN AMERICAN: 86 mL/min/{1.73_m2} (ref >59)
GLOBULIN, TOTAL: 2.8 g/dL (ref 1.5–4.5)
GLUCOSE: 157 mg/dL — AB (ref 65–99)
Potassium: 4.7 mmol/L (ref 3.5–5.2)
SODIUM: 138 mmol/L (ref 134–144)
TOTAL PROTEIN: 7.1 g/dL (ref 6.0–8.5)

## 2015-08-28 LAB — CBC WITH DIFFERENTIAL/PLATELET
BASOS ABS: 0 10*3/uL (ref 0.0–0.2)
Basos: 1 %
EOS (ABSOLUTE): 0.4 10*3/uL (ref 0.0–0.4)
EOS: 7 %
HEMATOCRIT: 36.1 % — AB (ref 37.5–51.0)
HEMOGLOBIN: 12.1 g/dL — AB (ref 12.6–17.7)
IMMATURE GRANS (ABS): 0 10*3/uL (ref 0.0–0.1)
IMMATURE GRANULOCYTES: 0 %
LYMPHS ABS: 1.5 10*3/uL (ref 0.7–3.1)
LYMPHS: 29 %
MCH: 30.5 pg (ref 26.6–33.0)
MCHC: 33.5 g/dL (ref 31.5–35.7)
MCV: 91 fL (ref 79–97)
MONOCYTES: 10 %
Monocytes Absolute: 0.5 10*3/uL (ref 0.1–0.9)
NEUTROS PCT: 53 %
Neutrophils Absolute: 2.7 10*3/uL (ref 1.4–7.0)
Platelets: 267 10*3/uL (ref 150–379)
RBC: 3.97 x10E6/uL — AB (ref 4.14–5.80)
RDW: 14.6 % (ref 12.3–15.4)
WBC: 5.1 10*3/uL (ref 3.4–10.8)

## 2015-08-28 LAB — TSH: TSH: 2.85 u[IU]/mL (ref 0.450–4.500)

## 2015-08-28 LAB — LIPID PANEL W/O CHOL/HDL RATIO
CHOLESTEROL TOTAL: 147 mg/dL (ref 100–199)
HDL: 27 mg/dL — ABNORMAL LOW (ref >39)
LDL CALC: 53 mg/dL (ref 0–99)
TRIGLYCERIDES: 335 mg/dL — AB (ref 0–149)
VLDL CHOLESTEROL CAL: 67 mg/dL — AB (ref 5–40)

## 2015-08-28 LAB — HGB A1C W/O EAG: HEMOGLOBIN A1C: 6.5 % — AB (ref 4.8–5.6)

## 2015-08-28 LAB — MAGNESIUM: MAGNESIUM: 1.8 mg/dL (ref 1.6–2.3)

## 2015-08-31 DIAGNOSIS — M9903 Segmental and somatic dysfunction of lumbar region: Secondary | ICD-10-CM | POA: Diagnosis not present

## 2015-08-31 DIAGNOSIS — M9902 Segmental and somatic dysfunction of thoracic region: Secondary | ICD-10-CM | POA: Diagnosis not present

## 2015-08-31 DIAGNOSIS — M9901 Segmental and somatic dysfunction of cervical region: Secondary | ICD-10-CM | POA: Diagnosis not present

## 2015-09-06 ENCOUNTER — Other Ambulatory Visit: Payer: Self-pay

## 2015-09-06 MED ORDER — FENOFIBRATE 145 MG PO TABS: 145.0000 mg | ORAL_TABLET | Freq: Every day | ORAL | Status: DC

## 2015-09-23 DIAGNOSIS — Z299 Encounter for prophylactic measures, unspecified: Secondary | ICD-10-CM | POA: Diagnosis not present

## 2015-09-23 DIAGNOSIS — R5383 Other fatigue: Secondary | ICD-10-CM | POA: Diagnosis not present

## 2015-09-23 DIAGNOSIS — E7800 Pure hypercholesterolemia, unspecified: Secondary | ICD-10-CM | POA: Diagnosis not present

## 2015-09-23 DIAGNOSIS — E1165 Type 2 diabetes mellitus with hyperglycemia: Secondary | ICD-10-CM | POA: Diagnosis not present

## 2015-09-23 DIAGNOSIS — Z7189 Other specified counseling: Secondary | ICD-10-CM | POA: Diagnosis not present

## 2015-09-23 DIAGNOSIS — Z0000 Encounter for general adult medical examination without abnormal findings: Secondary | ICD-10-CM | POA: Diagnosis not present

## 2015-09-23 DIAGNOSIS — Z125 Encounter for screening for malignant neoplasm of prostate: Secondary | ICD-10-CM | POA: Diagnosis not present

## 2015-09-23 DIAGNOSIS — Z6831 Body mass index (BMI) 31.0-31.9, adult: Secondary | ICD-10-CM | POA: Diagnosis not present

## 2015-09-23 DIAGNOSIS — Z1211 Encounter for screening for malignant neoplasm of colon: Secondary | ICD-10-CM | POA: Diagnosis not present

## 2015-09-23 DIAGNOSIS — Z1389 Encounter for screening for other disorder: Secondary | ICD-10-CM | POA: Diagnosis not present

## 2015-10-06 DIAGNOSIS — L609 Nail disorder, unspecified: Secondary | ICD-10-CM | POA: Diagnosis not present

## 2015-10-06 DIAGNOSIS — E1140 Type 2 diabetes mellitus with diabetic neuropathy, unspecified: Secondary | ICD-10-CM | POA: Diagnosis not present

## 2015-10-06 DIAGNOSIS — L110 Acquired keratosis follicularis: Secondary | ICD-10-CM | POA: Diagnosis not present

## 2015-10-13 DIAGNOSIS — M9902 Segmental and somatic dysfunction of thoracic region: Secondary | ICD-10-CM | POA: Diagnosis not present

## 2015-10-13 DIAGNOSIS — M9901 Segmental and somatic dysfunction of cervical region: Secondary | ICD-10-CM | POA: Diagnosis not present

## 2015-10-13 DIAGNOSIS — M9903 Segmental and somatic dysfunction of lumbar region: Secondary | ICD-10-CM | POA: Diagnosis not present

## 2015-10-13 DIAGNOSIS — M5431 Sciatica, right side: Secondary | ICD-10-CM | POA: Diagnosis not present

## 2015-11-10 DIAGNOSIS — M9901 Segmental and somatic dysfunction of cervical region: Secondary | ICD-10-CM | POA: Diagnosis not present

## 2015-11-10 DIAGNOSIS — M9902 Segmental and somatic dysfunction of thoracic region: Secondary | ICD-10-CM | POA: Diagnosis not present

## 2015-11-10 DIAGNOSIS — M5431 Sciatica, right side: Secondary | ICD-10-CM | POA: Diagnosis not present

## 2015-11-10 DIAGNOSIS — M9903 Segmental and somatic dysfunction of lumbar region: Secondary | ICD-10-CM | POA: Diagnosis not present

## 2015-11-25 DIAGNOSIS — M9903 Segmental and somatic dysfunction of lumbar region: Secondary | ICD-10-CM | POA: Diagnosis not present

## 2015-11-25 DIAGNOSIS — M9901 Segmental and somatic dysfunction of cervical region: Secondary | ICD-10-CM | POA: Diagnosis not present

## 2015-11-25 DIAGNOSIS — M5431 Sciatica, right side: Secondary | ICD-10-CM | POA: Diagnosis not present

## 2015-11-25 DIAGNOSIS — M9902 Segmental and somatic dysfunction of thoracic region: Secondary | ICD-10-CM | POA: Diagnosis not present

## 2015-11-26 DIAGNOSIS — E7800 Pure hypercholesterolemia, unspecified: Secondary | ICD-10-CM | POA: Diagnosis not present

## 2015-11-26 DIAGNOSIS — E119 Type 2 diabetes mellitus without complications: Secondary | ICD-10-CM | POA: Diagnosis not present

## 2015-11-26 DIAGNOSIS — I10 Essential (primary) hypertension: Secondary | ICD-10-CM | POA: Diagnosis not present

## 2015-12-23 ENCOUNTER — Ambulatory Visit: Payer: Medicare Other | Admitting: Cardiovascular Disease

## 2015-12-23 DIAGNOSIS — E1140 Type 2 diabetes mellitus with diabetic neuropathy, unspecified: Secondary | ICD-10-CM | POA: Diagnosis not present

## 2015-12-23 DIAGNOSIS — L110 Acquired keratosis follicularis: Secondary | ICD-10-CM | POA: Diagnosis not present

## 2015-12-23 DIAGNOSIS — L609 Nail disorder, unspecified: Secondary | ICD-10-CM | POA: Diagnosis not present

## 2015-12-27 DIAGNOSIS — M9902 Segmental and somatic dysfunction of thoracic region: Secondary | ICD-10-CM | POA: Diagnosis not present

## 2015-12-27 DIAGNOSIS — M9901 Segmental and somatic dysfunction of cervical region: Secondary | ICD-10-CM | POA: Diagnosis not present

## 2015-12-27 DIAGNOSIS — M9903 Segmental and somatic dysfunction of lumbar region: Secondary | ICD-10-CM | POA: Diagnosis not present

## 2015-12-30 DIAGNOSIS — R197 Diarrhea, unspecified: Secondary | ICD-10-CM | POA: Diagnosis not present

## 2015-12-30 DIAGNOSIS — E7800 Pure hypercholesterolemia, unspecified: Secondary | ICD-10-CM | POA: Diagnosis not present

## 2015-12-30 DIAGNOSIS — E1165 Type 2 diabetes mellitus with hyperglycemia: Secondary | ICD-10-CM | POA: Diagnosis not present

## 2015-12-30 DIAGNOSIS — Z6831 Body mass index (BMI) 31.0-31.9, adult: Secondary | ICD-10-CM | POA: Diagnosis not present

## 2015-12-30 DIAGNOSIS — Z789 Other specified health status: Secondary | ICD-10-CM | POA: Diagnosis not present

## 2015-12-31 ENCOUNTER — Ambulatory Visit (INDEPENDENT_AMBULATORY_CARE_PROVIDER_SITE_OTHER): Payer: Medicare Other | Admitting: Cardiovascular Disease

## 2015-12-31 ENCOUNTER — Encounter: Payer: Self-pay | Admitting: Cardiovascular Disease

## 2015-12-31 VITALS — BP 139/64 | HR 52 | Ht 74.0 in | Wt 250.6 lb

## 2015-12-31 DIAGNOSIS — I10 Essential (primary) hypertension: Secondary | ICD-10-CM

## 2015-12-31 DIAGNOSIS — E785 Hyperlipidemia, unspecified: Secondary | ICD-10-CM | POA: Diagnosis not present

## 2015-12-31 DIAGNOSIS — E119 Type 2 diabetes mellitus without complications: Secondary | ICD-10-CM

## 2015-12-31 DIAGNOSIS — R0789 Other chest pain: Secondary | ICD-10-CM

## 2015-12-31 DIAGNOSIS — K449 Diaphragmatic hernia without obstruction or gangrene: Secondary | ICD-10-CM | POA: Diagnosis not present

## 2015-12-31 DIAGNOSIS — G4733 Obstructive sleep apnea (adult) (pediatric): Secondary | ICD-10-CM | POA: Diagnosis not present

## 2015-12-31 NOTE — Patient Instructions (Signed)
Your physician wants you to follow-up in: 1 YEAR OR SOONER IF NEEDED. You will receive a reminder letter in the mail two months in advance. If you don't receive a letter, please call our office to schedule the follow-up appointment.   If you need a refill on your cardiac medications before your next appointment, please call your pharmacy. 

## 2016-01-02 ENCOUNTER — Encounter: Payer: Self-pay | Admitting: Cardiovascular Disease

## 2016-01-02 NOTE — Progress Notes (Signed)
Patient ID: Fuad Forget, male   DOB: [birth year], 70 y.o.   MRN: [id number]    Primary M.D.: Dr.A Lora Havens  HPI: Mr. Viola Placeres is a 70 year old African American male who presents to the office for a 4 month follow-up cardiology/sleep evaluation.  Mr. Downie  has a long history of hypertension,  type 2 diabetes mellitus, and hyperlipidemia. He  underwent an exercise nuclear study in [place] for atypical chest pain which revealed normal perfusion with an ejection fraction of 61%. There was no evidence for ischemia and he had normal wall motion. He was hypertensive throughout the study.  He has been maintained on blood pressure medicines consisting of diltiazem 300 mg daily, carvedilol 25 mg twice a day, and losartan HCT 100/25 mg. He previously, his had taken simvastatin 20 mg for hyperlipidemia  but self discontinued this.  Recently, he has been taking over-the-counter supplements with fair amount of omega-3 fatty acids for elevated triglycerides.[name] He takes Glucophage 850 mg in the morning and 2 tablets at night.   He has had some instances of atypical, probable musculoskeletal chest pain which improved with nonsteroidal therapy.  He underwent a 2-D echo Doppler study on 09/25/2013 and this revealed an ejection fraction of 60-65% with mild focal basal hypertrophy of the septum had normal diastolic parameters. There was moderate thickening of the trileaflet aortic valve consistent with aortic valve sclerosis. He had mild left atrial dilatation. His right ventricle was upper normal to mildly dilated.  Due to my concerns that he may have significant obstructive sleep apnea he underwent a diagnostic polysomnogram in March 2015 which revealed mild sleep apnea overall, with an AHI of 5.8, but he had moderate sleep apnea during REM sleep with an AHI of 25/hr.  He also had significant oxygen desaturation to 84% with non-REM sleep and 76% with REM sleep and had evidence for very loud snoring.  His  periodic limb movement index was 6.7 per hour with 9.5 per hour to arousal.  He underwent CPAP titration and 11 cm pressure was recommended.  He has a ResMed AirSense10 AutoSet.  A download from 03/11/2014 through 04/09/2014 revealed that he is meeting Medicare compliance with 100% days of the device usage.  97% of the time was used at greater than 4 hours.  With his current setting, AHI is excellent at 1.0.  His were index was 0.4.  He's using a fullface mask.  His DME company is [employer] in [place], [place].  Subjectively, since initiating therapy.  He feels markedly improved.  He denies daytime sleepiness.  He denies breakthrough snoring.  His sleep is more restorative.  He has more energy.  He denies hypersomnolence.  Over the last year, he has continued to use CPAP but states there are occasional nights where he falls asleep before putting on his CPAP unit.  He does not sleep as well most nights and oftentimes wakes up several more times to go to the bathroom. Recently, he has noticed that he may experience some increased palpitations in the morning.  He has not been able to correlate if this is related to potential night sweats.   When I last saw him I recommended that we obtain a new download from his CPAP unit and also schedule him for a 2-D echo Doppler study.  A download was obtained from 09/27/2015 through 12/25/2015.  This showed 77% of days of use with 69% of the days greater than 4 hours.  He was averaging 5 hours and 42 minutes of  CPAP use.  His AHI at 11 cm water pressure remained excellent at 0.7.  A 2-D echo Doppler study revealed an EF of 60-65%.  There was grade 2 diastolic dysfunction.  There was aortic valve sclerosis without stenosis or regurgitation.  There was suggestion that there was mildly reduced RV function.  He denies any anginal type chest pain.  He denies palpitations.  He notes mild back discomfort and rib discomfort but denies any exertional precipitation of this discomfort.   He presents for reevaluation.   Past Medical History  Diagnosis Date  . Hypertension   . Diabetes mellitus without complication ([employer])   . Hyperlipidemia     No past surgical history on file.  Allergies  Allergen Reactions  . Lisinopril Cough    Current Outpatient Prescriptions  Medication Sig Dispense Refill  . Alpha-Lipoic Acid 600 MG CAPS Take 1 capsule by mouth daily.    . Ashwagandha 500 MG CAPS Take 1 capsule by mouth daily.    . carvedilol (COREG) 25 MG tablet Take 1 tablet by mouth 2 (two) times daily.    . Cholecalciferol (VITAMIN D-3) 5000 UNITS TABS Take 1 tablet by mouth daily.    [name] diltiazem (TIAZAC) 300 MG 24 hr capsule Take 300 mg by mouth daily.    . fluticasone (FLONASE) 50 MCG/ACT nasal spray Place 1 spray into both nostrils daily.    [name] glipiZIDE (GLUCOTROL) 10 MG tablet Take 10 mg by mouth 2 (two) times daily.    [name] losartan-hydrochlorothiazide (HYZAAR) 100-25 MG per tablet Take 1 tablet by mouth daily.    . Lutein 6 MG CAPS Take 1 capsule by mouth daily.    . Magnesium 500 MG CAPS Take 1 capsule by mouth daily.    . metFORMIN (GLUCOPHAGE) 850 MG tablet Take by mouth. 1 tablet in the morning and 2 tablets in thePM    . Multiple Vitamins-Minerals (MULTIVITAMIN PO) Take 1 capsule by mouth daily.    . Omega-3 1000 MG CAPS Take 1 capsule by mouth 3 (three) times daily.    [name] omeprazole (PRILOSEC) 20 MG capsule Take 20 mg by mouth as needed.    [name] OVER THE COUNTER MEDICATION Take 1 tablet by mouth 2 (two) times daily. superbeta  prostate     No current facility-administered medications for this visit.    Social history is notable he is married for 45 years. He has 2 children and 3 grandchildren. 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 There is no history of tobacco use. There is no alcohol use.  Family History  Problem Relation Age of Onset  . Diabetes Mother   . Cancer -  Prostate Father   . Rheum arthritis Sister   . Diabetes Brother   . Cancer - Other Brother     pancreatic  . Rheum arthritis Brother   . Hypertension Son   . Diabetes type II Son    ROS General: Negative; No fevers, chills, or night sweats;  HEENT: Negative; No changes in vision or hearing, sinus congestion, difficulty swallowing Pulmonary: Negative; No cough, wheezing, shortness of breath, hemoptysis Cardiovascular: Occasional atypical chest pain; No presyncope, syncope, palpitations GI: Positive for hiatal hernia; No nausea, vomiting, diarrhea, or abdominal pain GU: Negative; No dysuria, hematuria, or difficulty voiding Musculoskeletal: Negative; no myalgias, joint pain, or weakness Hematologic/Oncology: Negative; no easy bruising, bleeding Endocrine: Positive for diabetes; no heat/cold intolerance; Neuro: Negative;  no changes in balance, headaches Skin: Negative; No rashes or skin lesions Psychiatric: Negative; No behavioral problems, depression Sleep: since starting CPAP therapy no snoring, daytime sleepiness, hypersomnolence, bruxism, restless legs, hypnogognic hallucinations, no cataplexy Other comprehensive 14 point system review is negative.   PE BP 139/64 mmHg  Pulse 52  Ht '6\' 2"'$  (1.88 m)  Wt 250 lb 9.6 oz (113.671 kg)  BMI 32.16 kg/m2   Wt Readings from Last 3 Encounters:  12/31/15 250 lb 9.6 oz (113.671 kg)  08/20/15 252 lb 3.2 oz (114.397 kg)  04/10/14 253 lb 8 oz (114.987 kg)   General: Alert, oriented, no distress.  Skin: normal turgor, no rashes HEENT: Normocephalic, atraumatic. Pupils round and reactive; sclera anicteric; extraocular muscles intact; Fundi mild arterial narrowing without hemorrhages or exudates. No xanthelasmas. Nose without nasal septal hypertrophy Mouth/Parynx benign; Mallinpatti scale 3 Neck: No JVD, no carotid bruits; normal carotid upstroke Lungs: clear to ausculatation and percussion; no wheezing or rales Chest wall:  Resolution of  prior pectoralis muscle tenderness; no rib tenderness Heart: RRR, s1 s2 normal 1/6 systolic murmur. No S3 gallop. No rub or heaves to Abdomen: soft, nontender; no hepatosplenomehaly, BS+; abdominal aorta nontender and not dilated by palpation. Back: no CVA tenderness Pulses 2+ Extremities: no clubbinbg cyanosis or edema, Homan's sign negative  Neurologic: grossly nonfocal; Cranial nerves grossly wnl Psychologic: Normal mood and affect  ECG (independently read by me): Sinus bradycardia 52 bpm.  Q wave in lead 3.  Normal intervals  January 2017 ECG (independently read by me):  Normal sinus rhythm at 62 with an isolated PVC.  QTC 410 ms.  First degree AV block with a PR interval 226.  Previous ECG from 09/15/13 (independently read by me): Sinus rhythm at 67 beats per minute with borderline first-degree block with PR interval 212 ms.   LABS:  BMP Latest Ref Rng 08/27/2015 09/29/2013  Glucose 65 - 99 mg/dL 157(H) 170(H)  BUN 8 - 27 mg/dL 15 16  Creatinine 0.76 - 1.27 mg/dL 0.91 1.08  BUN/Creat Ratio 10 - 22 16 -  Sodium 134 - 144 mmol/L 138 135  Potassium 3.5 - 5.2 mmol/L 4.7 4.7  Chloride 96 - 106 mmol/L 97 99  CO2 18 - 29 mmol/L 23 27  Calcium 8.6 - 10.2 mg/dL 9.9 9.2   Hepatic Function Latest Ref Rng 08/27/2015 09/29/2013  Total Protein 6.0 - 8.5 g/dL 7.1 6.5  Albumin 3.6 - 4.8 g/dL 4.3 4.2  AST 0 - 40 IU/L 17 23  ALT 0 - 44 IU/L 16 29  Alk Phosphatase 39 - 117 IU/L 63 59  Total Bilirubin 0.0 - 1.2 mg/dL 0.3 0.3   CBC Latest Ref Rng 08/27/2015 09/29/2013  WBC 3.4 - 10.8 x10E3/uL 5.1 4.5  Hemoglobin 13.0 - 17.0 g/dL - 12.9(L)  Hematocrit 37.5 - 51.0 % 36.1(L) 38.0(L)  Platelets 150 - 379 x10E3/uL 267 207   Lab Results  Component Value Date   MCV 91 08/27/2015   MCV 90.7 09/29/2013   Lab Results  Component Value Date   TSH 2.850 08/27/2015   Lab Results  Component Value Date   HGBA1C 6.5* 08/27/2015   Lipid Panel     Component Value Date/Time   CHOL 147 08/27/2015 0955    CHOL 84 09/29/2013 1027   TRIG 335* 08/27/2015 0955   HDL 27* 08/27/2015 0955   HDL 29* 09/29/2013 1027   CHOLHDL 2.9 09/29/2013 1027   VLDL 41* 09/29/2013 1027   LDLCALC 53 08/27/2015 0955  [place] 14 09/29/2013 1027     RADIOLOGY: No results found.   ASSESSMENT AND PLAN: Mr. Paragas is a 70 year old African American male who has cardiac risk factor profile notable for long-standing  hypertension and diabetes mellitus. There is a family history for coronary artery disease with his mother dying from myocardial infarction and who also diabetes mellitus at age 35.  His nuclear perfusion study revealed normal perfusion.  I reviewed his most recent echo Doppler study which revealed normal systolic function without regional wall motion abnormalities.  There was grade 2 diastolic dysfunction.  Abnormal tissue Doppler suggesting elevation of ventricular filling pressures.  With reference to his obstructive sleep apnea and CPAP therapy, I reviewed his most recent download.  I discussed with the with him the importance of longer used duration on days used.  At times, he falls asleep and does not put it on until he wakes up and goes to bed.  This has resulted in greater than 4 hours use at only 69%.  However, AHI remains excellent at 0.7, at his fixed 11 cm water pressure.  Laboratory in January revealed an LDL cholesterol of 80; however, he had elevated triglycerides and VLDL suggesting an atherogenic profile.  We discussed improved diet and he continues to take omega-3 fatty acids, which should be increased to 2 capsules twice a day.  Mostly he had been on statin therapy but discontinued this  His blood pressure today is stable on long-acting Cardizem 300 mg daily, carvedilol 25 mg twice a day and losartan HCT 100/25 mg.  He is diabetic on metformin and Glucotrol.  He has a history of a hiatal hernia.  His GERD is controlled with omeprazole.  We discussed weight loss with his body mass index of 32.16  kg/m.  I will see him in one year for follow-up evaluation.  Time spent: 25 minutes Troy Sine, MD, [facility] 01/02/2016 11:11 AM

## 2016-01-04 DIAGNOSIS — S39012A Strain of muscle, fascia and tendon of lower back, initial encounter: Secondary | ICD-10-CM | POA: Diagnosis not present

## 2016-01-19 DIAGNOSIS — E119 Type 2 diabetes mellitus without complications: Secondary | ICD-10-CM | POA: Diagnosis not present

## 2016-01-19 DIAGNOSIS — I10 Essential (primary) hypertension: Secondary | ICD-10-CM | POA: Diagnosis not present

## 2016-01-19 DIAGNOSIS — E7800 Pure hypercholesterolemia, unspecified: Secondary | ICD-10-CM | POA: Diagnosis not present

## 2016-01-28 DIAGNOSIS — M9903 Segmental and somatic dysfunction of lumbar region: Secondary | ICD-10-CM | POA: Diagnosis not present

## 2016-01-28 DIAGNOSIS — M9901 Segmental and somatic dysfunction of cervical region: Secondary | ICD-10-CM | POA: Diagnosis not present

## 2016-01-28 DIAGNOSIS — M9902 Segmental and somatic dysfunction of thoracic region: Secondary | ICD-10-CM | POA: Diagnosis not present

## 2016-01-31 DIAGNOSIS — Z789 Other specified health status: Secondary | ICD-10-CM | POA: Diagnosis not present

## 2016-01-31 DIAGNOSIS — B360 Pityriasis versicolor: Secondary | ICD-10-CM | POA: Diagnosis not present

## 2016-01-31 DIAGNOSIS — Z713 Dietary counseling and surveillance: Secondary | ICD-10-CM | POA: Diagnosis not present

## 2016-01-31 DIAGNOSIS — Z6833 Body mass index (BMI) 33.0-33.9, adult: Secondary | ICD-10-CM | POA: Diagnosis not present

## 2016-02-08 DIAGNOSIS — M9902 Segmental and somatic dysfunction of thoracic region: Secondary | ICD-10-CM | POA: Diagnosis not present

## 2016-02-08 DIAGNOSIS — M9901 Segmental and somatic dysfunction of cervical region: Secondary | ICD-10-CM | POA: Diagnosis not present

## 2016-02-08 DIAGNOSIS — M9903 Segmental and somatic dysfunction of lumbar region: Secondary | ICD-10-CM | POA: Diagnosis not present

## 2016-03-02 DIAGNOSIS — L110 Acquired keratosis follicularis: Secondary | ICD-10-CM | POA: Diagnosis not present

## 2016-03-02 DIAGNOSIS — L609 Nail disorder, unspecified: Secondary | ICD-10-CM | POA: Diagnosis not present

## 2016-03-02 DIAGNOSIS — E1140 Type 2 diabetes mellitus with diabetic neuropathy, unspecified: Secondary | ICD-10-CM | POA: Diagnosis not present

## 2016-03-09 DIAGNOSIS — E7800 Pure hypercholesterolemia, unspecified: Secondary | ICD-10-CM | POA: Diagnosis not present

## 2016-03-09 DIAGNOSIS — I10 Essential (primary) hypertension: Secondary | ICD-10-CM | POA: Diagnosis not present

## 2016-03-09 DIAGNOSIS — E119 Type 2 diabetes mellitus without complications: Secondary | ICD-10-CM | POA: Diagnosis not present

## 2016-03-16 DIAGNOSIS — M7021 Olecranon bursitis, right elbow: Secondary | ICD-10-CM | POA: Diagnosis not present

## 2016-03-16 DIAGNOSIS — M25521 Pain in right elbow: Secondary | ICD-10-CM | POA: Diagnosis not present

## 2016-03-30 DIAGNOSIS — M9903 Segmental and somatic dysfunction of lumbar region: Secondary | ICD-10-CM | POA: Diagnosis not present

## 2016-03-30 DIAGNOSIS — M9901 Segmental and somatic dysfunction of cervical region: Secondary | ICD-10-CM | POA: Diagnosis not present

## 2016-03-30 DIAGNOSIS — M9902 Segmental and somatic dysfunction of thoracic region: Secondary | ICD-10-CM | POA: Diagnosis not present

## 2016-04-11 DIAGNOSIS — Z299 Encounter for prophylactic measures, unspecified: Secondary | ICD-10-CM | POA: Diagnosis not present

## 2016-04-11 DIAGNOSIS — E7800 Pure hypercholesterolemia, unspecified: Secondary | ICD-10-CM | POA: Diagnosis not present

## 2016-04-11 DIAGNOSIS — N182 Chronic kidney disease, stage 2 (mild): Secondary | ICD-10-CM | POA: Diagnosis not present

## 2016-04-11 DIAGNOSIS — I493 Ventricular premature depolarization: Secondary | ICD-10-CM | POA: Diagnosis not present

## 2016-04-11 DIAGNOSIS — E1122 Type 2 diabetes mellitus with diabetic chronic kidney disease: Secondary | ICD-10-CM | POA: Diagnosis not present

## 2016-04-24 DIAGNOSIS — M9903 Segmental and somatic dysfunction of lumbar region: Secondary | ICD-10-CM | POA: Diagnosis not present

## 2016-04-24 DIAGNOSIS — M9902 Segmental and somatic dysfunction of thoracic region: Secondary | ICD-10-CM | POA: Diagnosis not present

## 2016-04-24 DIAGNOSIS — M9901 Segmental and somatic dysfunction of cervical region: Secondary | ICD-10-CM | POA: Diagnosis not present

## 2016-05-01 DIAGNOSIS — E1165 Type 2 diabetes mellitus with hyperglycemia: Secondary | ICD-10-CM | POA: Diagnosis not present

## 2016-05-01 DIAGNOSIS — E7800 Pure hypercholesterolemia, unspecified: Secondary | ICD-10-CM | POA: Diagnosis not present

## 2016-05-08 DIAGNOSIS — M9902 Segmental and somatic dysfunction of thoracic region: Secondary | ICD-10-CM | POA: Diagnosis not present

## 2016-05-08 DIAGNOSIS — M9901 Segmental and somatic dysfunction of cervical region: Secondary | ICD-10-CM | POA: Diagnosis not present

## 2016-05-08 DIAGNOSIS — M9903 Segmental and somatic dysfunction of lumbar region: Secondary | ICD-10-CM | POA: Diagnosis not present

## 2016-05-11 DIAGNOSIS — M546 Pain in thoracic spine: Secondary | ICD-10-CM | POA: Diagnosis not present

## 2016-05-11 DIAGNOSIS — M542 Cervicalgia: Secondary | ICD-10-CM | POA: Diagnosis not present

## 2016-05-11 DIAGNOSIS — M545 Low back pain: Secondary | ICD-10-CM | POA: Diagnosis not present

## 2016-05-11 DIAGNOSIS — M25519 Pain in unspecified shoulder: Secondary | ICD-10-CM | POA: Diagnosis not present

## 2016-05-16 DIAGNOSIS — M542 Cervicalgia: Secondary | ICD-10-CM | POA: Diagnosis not present

## 2016-05-16 DIAGNOSIS — M546 Pain in thoracic spine: Secondary | ICD-10-CM | POA: Diagnosis not present

## 2016-05-16 DIAGNOSIS — M25519 Pain in unspecified shoulder: Secondary | ICD-10-CM | POA: Diagnosis not present

## 2016-05-16 DIAGNOSIS — M545 Low back pain: Secondary | ICD-10-CM | POA: Diagnosis not present

## 2016-05-18 DIAGNOSIS — M545 Low back pain: Secondary | ICD-10-CM | POA: Diagnosis not present

## 2016-05-18 DIAGNOSIS — M542 Cervicalgia: Secondary | ICD-10-CM | POA: Diagnosis not present

## 2016-05-18 DIAGNOSIS — M25519 Pain in unspecified shoulder: Secondary | ICD-10-CM | POA: Diagnosis not present

## 2016-05-18 DIAGNOSIS — M546 Pain in thoracic spine: Secondary | ICD-10-CM | POA: Diagnosis not present

## 2016-05-22 DIAGNOSIS — E119 Type 2 diabetes mellitus without complications: Secondary | ICD-10-CM | POA: Diagnosis not present

## 2016-05-22 DIAGNOSIS — E7800 Pure hypercholesterolemia, unspecified: Secondary | ICD-10-CM | POA: Diagnosis not present

## 2016-05-22 DIAGNOSIS — I10 Essential (primary) hypertension: Secondary | ICD-10-CM | POA: Diagnosis not present

## 2016-05-23 DIAGNOSIS — M542 Cervicalgia: Secondary | ICD-10-CM | POA: Diagnosis not present

## 2016-05-23 DIAGNOSIS — M546 Pain in thoracic spine: Secondary | ICD-10-CM | POA: Diagnosis not present

## 2016-05-23 DIAGNOSIS — M545 Low back pain: Secondary | ICD-10-CM | POA: Diagnosis not present

## 2016-05-23 DIAGNOSIS — M25519 Pain in unspecified shoulder: Secondary | ICD-10-CM | POA: Diagnosis not present

## 2016-05-24 DIAGNOSIS — M542 Cervicalgia: Secondary | ICD-10-CM | POA: Diagnosis not present

## 2016-05-24 DIAGNOSIS — M545 Low back pain: Secondary | ICD-10-CM | POA: Diagnosis not present

## 2016-05-24 DIAGNOSIS — M546 Pain in thoracic spine: Secondary | ICD-10-CM | POA: Diagnosis not present

## 2016-05-24 DIAGNOSIS — M25519 Pain in unspecified shoulder: Secondary | ICD-10-CM | POA: Diagnosis not present

## 2016-05-29 DIAGNOSIS — M546 Pain in thoracic spine: Secondary | ICD-10-CM | POA: Diagnosis not present

## 2016-05-29 DIAGNOSIS — M25519 Pain in unspecified shoulder: Secondary | ICD-10-CM | POA: Diagnosis not present

## 2016-05-29 DIAGNOSIS — M542 Cervicalgia: Secondary | ICD-10-CM | POA: Diagnosis not present

## 2016-05-29 DIAGNOSIS — M545 Low back pain: Secondary | ICD-10-CM | POA: Diagnosis not present

## 2016-06-01 DIAGNOSIS — M546 Pain in thoracic spine: Secondary | ICD-10-CM | POA: Diagnosis not present

## 2016-06-01 DIAGNOSIS — M545 Low back pain: Secondary | ICD-10-CM | POA: Diagnosis not present

## 2016-06-01 DIAGNOSIS — M25519 Pain in unspecified shoulder: Secondary | ICD-10-CM | POA: Diagnosis not present

## 2016-06-01 DIAGNOSIS — M542 Cervicalgia: Secondary | ICD-10-CM | POA: Diagnosis not present

## 2016-06-06 DIAGNOSIS — M545 Low back pain: Secondary | ICD-10-CM | POA: Diagnosis not present

## 2016-06-06 DIAGNOSIS — M542 Cervicalgia: Secondary | ICD-10-CM | POA: Diagnosis not present

## 2016-06-06 DIAGNOSIS — M546 Pain in thoracic spine: Secondary | ICD-10-CM | POA: Diagnosis not present

## 2016-06-06 DIAGNOSIS — M25519 Pain in unspecified shoulder: Secondary | ICD-10-CM | POA: Diagnosis not present

## 2016-06-07 DIAGNOSIS — M545 Low back pain: Secondary | ICD-10-CM | POA: Diagnosis not present

## 2016-06-07 DIAGNOSIS — M25519 Pain in unspecified shoulder: Secondary | ICD-10-CM | POA: Diagnosis not present

## 2016-06-07 DIAGNOSIS — M546 Pain in thoracic spine: Secondary | ICD-10-CM | POA: Diagnosis not present

## 2016-06-07 DIAGNOSIS — M542 Cervicalgia: Secondary | ICD-10-CM | POA: Diagnosis not present

## 2016-06-12 DIAGNOSIS — M546 Pain in thoracic spine: Secondary | ICD-10-CM | POA: Diagnosis not present

## 2016-06-12 DIAGNOSIS — M542 Cervicalgia: Secondary | ICD-10-CM | POA: Diagnosis not present

## 2016-06-12 DIAGNOSIS — M545 Low back pain: Secondary | ICD-10-CM | POA: Diagnosis not present

## 2016-06-12 DIAGNOSIS — M25519 Pain in unspecified shoulder: Secondary | ICD-10-CM | POA: Diagnosis not present

## 2016-06-14 DIAGNOSIS — M545 Low back pain: Secondary | ICD-10-CM | POA: Diagnosis not present

## 2016-06-14 DIAGNOSIS — M25519 Pain in unspecified shoulder: Secondary | ICD-10-CM | POA: Diagnosis not present

## 2016-06-14 DIAGNOSIS — M546 Pain in thoracic spine: Secondary | ICD-10-CM | POA: Diagnosis not present

## 2016-06-14 DIAGNOSIS — M542 Cervicalgia: Secondary | ICD-10-CM | POA: Diagnosis not present

## 2016-06-20 DIAGNOSIS — M542 Cervicalgia: Secondary | ICD-10-CM | POA: Diagnosis not present

## 2016-06-20 DIAGNOSIS — M546 Pain in thoracic spine: Secondary | ICD-10-CM | POA: Diagnosis not present

## 2016-06-20 DIAGNOSIS — M25519 Pain in unspecified shoulder: Secondary | ICD-10-CM | POA: Diagnosis not present

## 2016-06-20 DIAGNOSIS — M545 Low back pain: Secondary | ICD-10-CM | POA: Diagnosis not present

## 2016-06-26 DIAGNOSIS — E119 Type 2 diabetes mellitus without complications: Secondary | ICD-10-CM | POA: Diagnosis not present

## 2016-06-26 DIAGNOSIS — I10 Essential (primary) hypertension: Secondary | ICD-10-CM | POA: Diagnosis not present

## 2016-06-26 DIAGNOSIS — E7800 Pure hypercholesterolemia, unspecified: Secondary | ICD-10-CM | POA: Diagnosis not present

## 2016-06-28 DIAGNOSIS — M7541 Impingement syndrome of right shoulder: Secondary | ICD-10-CM | POA: Diagnosis not present

## 2016-06-28 DIAGNOSIS — M7021 Olecranon bursitis, right elbow: Secondary | ICD-10-CM | POA: Diagnosis not present

## 2016-06-28 DIAGNOSIS — M25511 Pain in right shoulder: Secondary | ICD-10-CM | POA: Diagnosis not present

## 2016-06-28 DIAGNOSIS — M25562 Pain in left knee: Secondary | ICD-10-CM | POA: Diagnosis not present

## 2016-06-28 DIAGNOSIS — M1712 Unilateral primary osteoarthritis, left knee: Secondary | ICD-10-CM | POA: Diagnosis not present

## 2016-07-03 DIAGNOSIS — M546 Pain in thoracic spine: Secondary | ICD-10-CM | POA: Diagnosis not present

## 2016-07-03 DIAGNOSIS — M545 Low back pain: Secondary | ICD-10-CM | POA: Diagnosis not present

## 2016-07-03 DIAGNOSIS — M542 Cervicalgia: Secondary | ICD-10-CM | POA: Diagnosis not present

## 2016-07-03 DIAGNOSIS — M25519 Pain in unspecified shoulder: Secondary | ICD-10-CM | POA: Diagnosis not present

## 2016-07-05 DIAGNOSIS — M546 Pain in thoracic spine: Secondary | ICD-10-CM | POA: Diagnosis not present

## 2016-07-05 DIAGNOSIS — M25519 Pain in unspecified shoulder: Secondary | ICD-10-CM | POA: Diagnosis not present

## 2016-07-05 DIAGNOSIS — M542 Cervicalgia: Secondary | ICD-10-CM | POA: Diagnosis not present

## 2016-07-05 DIAGNOSIS — M545 Low back pain: Secondary | ICD-10-CM | POA: Diagnosis not present

## 2016-07-14 DIAGNOSIS — M9903 Segmental and somatic dysfunction of lumbar region: Secondary | ICD-10-CM | POA: Diagnosis not present

## 2016-07-14 DIAGNOSIS — M9902 Segmental and somatic dysfunction of thoracic region: Secondary | ICD-10-CM | POA: Diagnosis not present

## 2016-07-14 DIAGNOSIS — M9901 Segmental and somatic dysfunction of cervical region: Secondary | ICD-10-CM | POA: Diagnosis not present

## 2016-07-18 DIAGNOSIS — E7800 Pure hypercholesterolemia, unspecified: Secondary | ICD-10-CM | POA: Diagnosis not present

## 2016-07-18 DIAGNOSIS — E119 Type 2 diabetes mellitus without complications: Secondary | ICD-10-CM | POA: Diagnosis not present

## 2016-07-18 DIAGNOSIS — I10 Essential (primary) hypertension: Secondary | ICD-10-CM | POA: Diagnosis not present

## 2016-07-28 DIAGNOSIS — Z299 Encounter for prophylactic measures, unspecified: Secondary | ICD-10-CM | POA: Diagnosis not present

## 2016-07-28 DIAGNOSIS — E7800 Pure hypercholesterolemia, unspecified: Secondary | ICD-10-CM | POA: Diagnosis not present

## 2016-07-28 DIAGNOSIS — Z6832 Body mass index (BMI) 32.0-32.9, adult: Secondary | ICD-10-CM | POA: Diagnosis not present

## 2016-07-28 DIAGNOSIS — N182 Chronic kidney disease, stage 2 (mild): Secondary | ICD-10-CM | POA: Diagnosis not present

## 2016-07-28 DIAGNOSIS — R0989 Other specified symptoms and signs involving the circulatory and respiratory systems: Secondary | ICD-10-CM | POA: Diagnosis not present

## 2016-07-28 DIAGNOSIS — E1122 Type 2 diabetes mellitus with diabetic chronic kidney disease: Secondary | ICD-10-CM | POA: Diagnosis not present

## 2016-08-22 DIAGNOSIS — E119 Type 2 diabetes mellitus without complications: Secondary | ICD-10-CM | POA: Diagnosis not present

## 2016-08-22 DIAGNOSIS — E7800 Pure hypercholesterolemia, unspecified: Secondary | ICD-10-CM | POA: Diagnosis not present

## 2016-08-22 DIAGNOSIS — I10 Essential (primary) hypertension: Secondary | ICD-10-CM | POA: Diagnosis not present

## 2016-08-24 DIAGNOSIS — M9903 Segmental and somatic dysfunction of lumbar region: Secondary | ICD-10-CM | POA: Diagnosis not present

## 2016-08-24 DIAGNOSIS — M9902 Segmental and somatic dysfunction of thoracic region: Secondary | ICD-10-CM | POA: Diagnosis not present

## 2016-08-24 DIAGNOSIS — M9901 Segmental and somatic dysfunction of cervical region: Secondary | ICD-10-CM | POA: Diagnosis not present

## 2016-09-06 DIAGNOSIS — M9901 Segmental and somatic dysfunction of cervical region: Secondary | ICD-10-CM | POA: Diagnosis not present

## 2016-09-06 DIAGNOSIS — M9903 Segmental and somatic dysfunction of lumbar region: Secondary | ICD-10-CM | POA: Diagnosis not present

## 2016-09-06 DIAGNOSIS — M9902 Segmental and somatic dysfunction of thoracic region: Secondary | ICD-10-CM | POA: Diagnosis not present

## 2016-09-21 DIAGNOSIS — M9901 Segmental and somatic dysfunction of cervical region: Secondary | ICD-10-CM | POA: Diagnosis not present

## 2016-09-21 DIAGNOSIS — M9902 Segmental and somatic dysfunction of thoracic region: Secondary | ICD-10-CM | POA: Diagnosis not present

## 2016-09-21 DIAGNOSIS — M9903 Segmental and somatic dysfunction of lumbar region: Secondary | ICD-10-CM | POA: Diagnosis not present

## 2016-10-05 DIAGNOSIS — R5383 Other fatigue: Secondary | ICD-10-CM | POA: Diagnosis not present

## 2016-10-05 DIAGNOSIS — E1140 Type 2 diabetes mellitus with diabetic neuropathy, unspecified: Secondary | ICD-10-CM | POA: Diagnosis not present

## 2016-10-05 DIAGNOSIS — Z7189 Other specified counseling: Secondary | ICD-10-CM | POA: Diagnosis not present

## 2016-10-05 DIAGNOSIS — E7800 Pure hypercholesterolemia, unspecified: Secondary | ICD-10-CM | POA: Diagnosis not present

## 2016-10-05 DIAGNOSIS — N182 Chronic kidney disease, stage 2 (mild): Secondary | ICD-10-CM | POA: Diagnosis not present

## 2016-10-05 DIAGNOSIS — Z125 Encounter for screening for malignant neoplasm of prostate: Secondary | ICD-10-CM | POA: Diagnosis not present

## 2016-10-05 DIAGNOSIS — Z6832 Body mass index (BMI) 32.0-32.9, adult: Secondary | ICD-10-CM | POA: Diagnosis not present

## 2016-10-05 DIAGNOSIS — Z1211 Encounter for screening for malignant neoplasm of colon: Secondary | ICD-10-CM | POA: Diagnosis not present

## 2016-10-05 DIAGNOSIS — Z299 Encounter for prophylactic measures, unspecified: Secondary | ICD-10-CM | POA: Diagnosis not present

## 2016-10-05 DIAGNOSIS — Z79899 Other long term (current) drug therapy: Secondary | ICD-10-CM | POA: Diagnosis not present

## 2016-10-05 DIAGNOSIS — L609 Nail disorder, unspecified: Secondary | ICD-10-CM | POA: Diagnosis not present

## 2016-10-05 DIAGNOSIS — Z0000 Encounter for general adult medical examination without abnormal findings: Secondary | ICD-10-CM | POA: Diagnosis not present

## 2016-10-05 DIAGNOSIS — I10 Essential (primary) hypertension: Secondary | ICD-10-CM | POA: Diagnosis not present

## 2016-10-05 DIAGNOSIS — Z1389 Encounter for screening for other disorder: Secondary | ICD-10-CM | POA: Diagnosis not present

## 2016-10-05 DIAGNOSIS — L110 Acquired keratosis follicularis: Secondary | ICD-10-CM | POA: Diagnosis not present

## 2016-10-05 DIAGNOSIS — E1122 Type 2 diabetes mellitus with diabetic chronic kidney disease: Secondary | ICD-10-CM | POA: Diagnosis not present

## 2016-10-05 DIAGNOSIS — R32 Unspecified urinary incontinence: Secondary | ICD-10-CM | POA: Diagnosis not present

## 2016-10-11 DIAGNOSIS — E7800 Pure hypercholesterolemia, unspecified: Secondary | ICD-10-CM | POA: Diagnosis not present

## 2016-10-11 DIAGNOSIS — E119 Type 2 diabetes mellitus without complications: Secondary | ICD-10-CM | POA: Diagnosis not present

## 2016-10-11 DIAGNOSIS — I10 Essential (primary) hypertension: Secondary | ICD-10-CM | POA: Diagnosis not present

## 2016-10-31 DIAGNOSIS — M9901 Segmental and somatic dysfunction of cervical region: Secondary | ICD-10-CM | POA: Diagnosis not present

## 2016-10-31 DIAGNOSIS — M9902 Segmental and somatic dysfunction of thoracic region: Secondary | ICD-10-CM | POA: Diagnosis not present

## 2016-10-31 DIAGNOSIS — M9903 Segmental and somatic dysfunction of lumbar region: Secondary | ICD-10-CM | POA: Diagnosis not present

## 2016-11-02 DIAGNOSIS — E7800 Pure hypercholesterolemia, unspecified: Secondary | ICD-10-CM | POA: Diagnosis not present

## 2016-11-02 DIAGNOSIS — G4730 Sleep apnea, unspecified: Secondary | ICD-10-CM | POA: Diagnosis not present

## 2016-11-02 DIAGNOSIS — Z6833 Body mass index (BMI) 33.0-33.9, adult: Secondary | ICD-10-CM | POA: Diagnosis not present

## 2016-11-02 DIAGNOSIS — I493 Ventricular premature depolarization: Secondary | ICD-10-CM | POA: Diagnosis not present

## 2016-11-02 DIAGNOSIS — N182 Chronic kidney disease, stage 2 (mild): Secondary | ICD-10-CM | POA: Diagnosis not present

## 2016-11-02 DIAGNOSIS — I10 Essential (primary) hypertension: Secondary | ICD-10-CM | POA: Diagnosis not present

## 2016-11-02 DIAGNOSIS — Z299 Encounter for prophylactic measures, unspecified: Secondary | ICD-10-CM | POA: Diagnosis not present

## 2016-11-02 DIAGNOSIS — E1122 Type 2 diabetes mellitus with diabetic chronic kidney disease: Secondary | ICD-10-CM | POA: Diagnosis not present

## 2016-11-02 DIAGNOSIS — Z713 Dietary counseling and surveillance: Secondary | ICD-10-CM | POA: Diagnosis not present

## 2016-11-08 DIAGNOSIS — E119 Type 2 diabetes mellitus without complications: Secondary | ICD-10-CM | POA: Diagnosis not present

## 2016-11-08 DIAGNOSIS — I10 Essential (primary) hypertension: Secondary | ICD-10-CM | POA: Diagnosis not present

## 2016-11-08 DIAGNOSIS — E7800 Pure hypercholesterolemia, unspecified: Secondary | ICD-10-CM | POA: Diagnosis not present

## 2016-12-06 DIAGNOSIS — M7021 Olecranon bursitis, right elbow: Secondary | ICD-10-CM | POA: Diagnosis not present

## 2016-12-06 DIAGNOSIS — M25521 Pain in right elbow: Secondary | ICD-10-CM | POA: Diagnosis not present

## 2016-12-11 DIAGNOSIS — M9903 Segmental and somatic dysfunction of lumbar region: Secondary | ICD-10-CM | POA: Diagnosis not present

## 2016-12-11 DIAGNOSIS — M9902 Segmental and somatic dysfunction of thoracic region: Secondary | ICD-10-CM | POA: Diagnosis not present

## 2016-12-11 DIAGNOSIS — M9901 Segmental and somatic dysfunction of cervical region: Secondary | ICD-10-CM | POA: Diagnosis not present

## 2016-12-18 DIAGNOSIS — I10 Essential (primary) hypertension: Secondary | ICD-10-CM | POA: Diagnosis not present

## 2016-12-18 DIAGNOSIS — E7800 Pure hypercholesterolemia, unspecified: Secondary | ICD-10-CM | POA: Diagnosis not present

## 2016-12-18 DIAGNOSIS — E119 Type 2 diabetes mellitus without complications: Secondary | ICD-10-CM | POA: Diagnosis not present

## 2016-12-26 DIAGNOSIS — K921 Melena: Secondary | ICD-10-CM | POA: Diagnosis not present

## 2016-12-26 DIAGNOSIS — R1013 Epigastric pain: Secondary | ICD-10-CM | POA: Diagnosis not present

## 2016-12-26 DIAGNOSIS — R109 Unspecified abdominal pain: Secondary | ICD-10-CM | POA: Diagnosis not present

## 2016-12-26 DIAGNOSIS — K449 Diaphragmatic hernia without obstruction or gangrene: Secondary | ICD-10-CM | POA: Diagnosis not present

## 2016-12-27 DIAGNOSIS — M25521 Pain in right elbow: Secondary | ICD-10-CM | POA: Diagnosis not present

## 2016-12-27 DIAGNOSIS — M7021 Olecranon bursitis, right elbow: Secondary | ICD-10-CM | POA: Diagnosis not present

## 2016-12-31 ENCOUNTER — Inpatient Hospital Stay (HOSPITAL_COMMUNITY)
Admission: AD | Admit: 2016-12-31 | Discharge: 2017-01-10 | DRG: 233 | Disposition: A | Discharge status: Home / Self Care | Payer: Medicare Other | Source: Other Acute Inpatient Hospital | Attending: Thoracic Surgery (Cardiothoracic Vascular Surgery) | Admitting: Thoracic Surgery (Cardiothoracic Vascular Surgery)

## 2016-12-31 ENCOUNTER — Encounter (HOSPITAL_COMMUNITY): Payer: Self-pay | Admitting: *Deleted

## 2016-12-31 DIAGNOSIS — I10 Essential (primary) hypertension: Secondary | ICD-10-CM | POA: Diagnosis present

## 2016-12-31 DIAGNOSIS — R001 Bradycardia, unspecified: Secondary | ICD-10-CM | POA: Diagnosis not present

## 2016-12-31 DIAGNOSIS — I440 Atrioventricular block, first degree: Secondary | ICD-10-CM | POA: Diagnosis present

## 2016-12-31 DIAGNOSIS — E7800 Pure hypercholesterolemia, unspecified: Secondary | ICD-10-CM | POA: Diagnosis not present

## 2016-12-31 DIAGNOSIS — I252 Old myocardial infarction: Secondary | ICD-10-CM | POA: Diagnosis not present

## 2016-12-31 DIAGNOSIS — Z79899 Other long term (current) drug therapy: Secondary | ICD-10-CM

## 2016-12-31 DIAGNOSIS — Z7984 Long term (current) use of oral hypoglycemic drugs: Secondary | ICD-10-CM | POA: Diagnosis not present

## 2016-12-31 DIAGNOSIS — I25110 Atherosclerotic heart disease of native coronary artery with unstable angina pectoris: Secondary | ICD-10-CM | POA: Diagnosis present

## 2016-12-31 DIAGNOSIS — E119 Type 2 diabetes mellitus without complications: Secondary | ICD-10-CM | POA: Diagnosis not present

## 2016-12-31 DIAGNOSIS — R609 Edema, unspecified: Secondary | ICD-10-CM | POA: Diagnosis not present

## 2016-12-31 DIAGNOSIS — G4733 Obstructive sleep apnea (adult) (pediatric): Secondary | ICD-10-CM | POA: Diagnosis not present

## 2016-12-31 DIAGNOSIS — Z794 Long term (current) use of insulin: Secondary | ICD-10-CM | POA: Diagnosis not present

## 2016-12-31 DIAGNOSIS — I2510 Atherosclerotic heart disease of native coronary artery without angina pectoris: Secondary | ICD-10-CM | POA: Diagnosis present

## 2016-12-31 DIAGNOSIS — Z888 Allergy status to other drugs, medicaments and biological substances status: Secondary | ICD-10-CM | POA: Diagnosis not present

## 2016-12-31 DIAGNOSIS — J9811 Atelectasis: Secondary | ICD-10-CM

## 2016-12-31 DIAGNOSIS — Z87891 Personal history of nicotine dependence: Secondary | ICD-10-CM | POA: Diagnosis not present

## 2016-12-31 DIAGNOSIS — I214 Non-ST elevation (NSTEMI) myocardial infarction: Secondary | ICD-10-CM | POA: Diagnosis not present

## 2016-12-31 DIAGNOSIS — J939 Pneumothorax, unspecified: Secondary | ICD-10-CM | POA: Diagnosis not present

## 2016-12-31 DIAGNOSIS — R079 Chest pain, unspecified: Secondary | ICD-10-CM | POA: Diagnosis present

## 2016-12-31 DIAGNOSIS — E785 Hyperlipidemia, unspecified: Secondary | ICD-10-CM | POA: Diagnosis present

## 2016-12-31 DIAGNOSIS — Z8249 Family history of ischemic heart disease and other diseases of the circulatory system: Secondary | ICD-10-CM

## 2016-12-31 DIAGNOSIS — Z951 Presence of aortocoronary bypass graft: Secondary | ICD-10-CM

## 2016-12-31 DIAGNOSIS — R072 Precordial pain: Secondary | ICD-10-CM

## 2016-12-31 DIAGNOSIS — Z9689 Presence of other specified functional implants: Secondary | ICD-10-CM

## 2016-12-31 DIAGNOSIS — Z7951 Long term (current) use of inhaled steroids: Secondary | ICD-10-CM

## 2016-12-31 DIAGNOSIS — J90 Pleural effusion, not elsewhere classified: Secondary | ICD-10-CM

## 2016-12-31 DIAGNOSIS — D62 Acute posthemorrhagic anemia: Secondary | ICD-10-CM | POA: Diagnosis not present

## 2016-12-31 HISTORY — DX: Presence of aortocoronary bypass graft: Z95.1

## 2016-12-31 HISTORY — DX: Personal history of other diseases of the digestive system: Z87.19

## 2016-12-31 LAB — CBC
HCT: 37.5 % — ABNORMAL LOW (ref 39.0–52.0)
HEMOGLOBIN: 11.8 g/dL — AB (ref 13.0–17.0)
MCH: 29.4 pg (ref 26.0–34.0)
MCHC: 31.5 g/dL (ref 30.0–36.0)
MCV: 93.5 fL (ref 78.0–100.0)
PLATELETS: 251 10*3/uL (ref 150–400)
RBC: 4.01 MIL/uL — ABNORMAL LOW (ref 4.22–5.81)
RDW: 13.7 % (ref 11.5–15.5)
WBC: 4.8 10*3/uL (ref 4.0–10.5)

## 2016-12-31 LAB — CREATININE, SERUM
CREATININE: 0.92 mg/dL (ref 0.61–1.24)
GFR calc Af Amer: >60 mL/min (ref >60)

## 2016-12-31 LAB — GLUCOSE, CAPILLARY
Glucose-Capillary: 70 mg/dL (ref 65–99)
Glucose-Capillary: 140 mg/dL — ABNORMAL HIGH (ref 65–99)

## 2016-12-31 LAB — TROPONIN I: TROPONIN I: 1.03 ng/mL — AB (ref <0.03)

## 2016-12-31 MED ORDER — CARVEDILOL 25 MG PO TABS
25.0000 mg | ORAL_TABLET | Freq: Two times a day (BID) | ORAL | Status: DC
  Administered 2016-12-31 – 2017-01-03 (×7): 25 mg via ORAL
  Filled 2016-12-31 (×7): qty 1

## 2016-12-31 MED ORDER — LOSARTAN POTASSIUM 50 MG PO TABS
100.0000 mg | ORAL_TABLET | Freq: Every day | ORAL | Status: DC
  Administered 2017-01-01 – 2017-01-03 (×3): 100 mg via ORAL
  Filled 2016-12-31 (×3): qty 2

## 2016-12-31 MED ORDER — FAMOTIDINE 20 MG PO TABS
20.0000 mg | ORAL_TABLET | Freq: Every day | ORAL | Status: DC
  Administered 2017-01-01 – 2017-01-03 (×3): 20 mg via ORAL
  Filled 2016-12-31 (×3): qty 1

## 2016-12-31 MED ORDER — HEPARIN SODIUM (PORCINE) 5000 UNIT/ML IJ SOLN: 5000.0000 [IU] | Freq: Three times a day (TID) | INTRAMUSCULAR | Status: DC

## 2016-12-31 MED ORDER — VITAMIN D 1000 UNITS PO TABS
10000.0000 [IU] | ORAL_TABLET | Freq: Every day | ORAL | Status: DC
  Filled 2016-12-31: qty 10

## 2016-12-31 MED ORDER — DILTIAZEM HCL ER BEADS 300 MG PO CP24
300.0000 mg | ORAL_CAPSULE | Freq: Every day | ORAL | Status: DC
  Administered 2016-12-31 – 2017-01-03 (×4): 300 mg via ORAL
  Filled 2016-12-31 (×5): qty 1

## 2016-12-31 MED ORDER — ASPIRIN EC 81 MG PO TBEC
81.0000 mg | DELAYED_RELEASE_TABLET | Freq: Every day | ORAL | Status: DC
  Administered 2017-01-01 – 2017-01-02 (×2): 81 mg via ORAL
  Filled 2016-12-31 (×2): qty 1

## 2016-12-31 MED ORDER — ENOXAPARIN SODIUM 120 MG/0.8ML ~~LOC~~ SOLN
110.0000 mg | Freq: Two times a day (BID) | SUBCUTANEOUS | Status: DC
  Administered 2016-12-31: 110 mg via SUBCUTANEOUS
  Filled 2016-12-31 (×2): qty 0.73

## 2016-12-31 MED ORDER — NITROGLYCERIN 0.4 MG SL SUBL: 0.4000 mg | SUBLINGUAL_TABLET | SUBLINGUAL | Status: DC | PRN

## 2016-12-31 MED ORDER — ACETAMINOPHEN 325 MG PO TABS
650.0000 mg | ORAL_TABLET | ORAL | Status: DC | PRN
  Administered 2016-12-31 – 2017-01-02 (×4): 650 mg via ORAL
  Filled 2016-12-31 (×4): qty 2

## 2016-12-31 MED ORDER — FLUTICASONE PROPIONATE 50 MCG/ACT NA SUSP: 1.0000 | Freq: Every day | NASAL | Status: DC | PRN

## 2016-12-31 MED ORDER — MAGNESIUM OXIDE 400 (241.3 MG) MG PO TABS
400.0000 mg | ORAL_TABLET | Freq: Every day | ORAL | Status: DC
  Administered 2017-01-01 – 2017-01-03 (×3): 400 mg via ORAL
  Filled 2016-12-31 (×3): qty 1

## 2016-12-31 MED ORDER — INSULIN ASPART 100 UNIT/ML ~~LOC~~ SOLN
0.0000 [IU] | Freq: Three times a day (TID) | SUBCUTANEOUS | Status: DC
  Administered 2017-01-01: 3 [IU] via SUBCUTANEOUS
  Administered 2017-01-01 – 2017-01-02 (×2): 2 [IU] via SUBCUTANEOUS
  Administered 2017-01-02: 3 [IU] via SUBCUTANEOUS
  Administered 2017-01-03: 2 [IU] via SUBCUTANEOUS
  Administered 2017-01-03 (×2): 3 [IU] via SUBCUTANEOUS

## 2016-12-31 MED ORDER — MAGNESIUM 100 MG PO TABS: 100.0000 mg | ORAL_TABLET | Freq: Every day | ORAL | Status: DC

## 2016-12-31 MED ORDER — ALPHA-LIPOIC ACID 600 MG PO CAPS: 600.0000 mg | ORAL_CAPSULE | Freq: Every day | ORAL | Status: DC

## 2016-12-31 MED ORDER — ADULT MULTIVITAMIN W/MINERALS CH
2.0000 | ORAL_TABLET | Freq: Every day | ORAL | Status: DC
  Administered 2017-01-01 – 2017-01-03 (×3): 2 via ORAL
  Filled 2016-12-31 (×3): qty 2

## 2016-12-31 MED ORDER — ONDANSETRON HCL 4 MG/2ML IJ SOLN: 4.0000 mg | Freq: Four times a day (QID) | INTRAMUSCULAR | Status: DC | PRN

## 2016-12-31 NOTE — Progress Notes (Signed)
CRITICAL VALUE ALERT  Critical value received:  Troponin   Date of notification:  12/31/2016  Time of notification:  1940  Critical value read back:Yes.    Nurse who received alert:  B. Sallee Lange RN   MD notified (1st page):  Dr. Jeannine Boga on call with Cardiology  Time of first page:  1944  Responding MD:  Dr. Jeannine Boga  Time MD responded:  [phone]

## 2016-12-31 NOTE — Progress Notes (Signed)
ANTICOAGULATION CONSULT NOTE - Initial Consult  Pharmacy Consult for lovenox Indication: chest pain/ACS  Allergies  Allergen Reactions  . Lisinopril Cough    Patient Measurements: Height: 6\' 1"  (185.4 cm) Weight: 248 lb 4.8 oz (112.6 kg) IBW/kg (Calculated) : 79.9   Vital Signs: Temp: 98 F (36.7 C) (05/20 1605) Temp Source: Oral (05/20 1605) BP: 136/78 (05/20 1605) Pulse Rate: 61 (05/20 1605)  Labs:  Recent Labs  12/31/16 1747  HGB 11.8*  HCT 37.5*  PLT 251  CREATININE 0.92  TROPONINI 1.03*    Estimated Creatinine Clearance: 99.7 mL/min (by C-G formula based on SCr of 0.92 mg/dL).   Medical History: Past Medical History:  Diagnosis Date  . Diabetes mellitus without complication ([place])   . Hyperlipidemia   . Hypertension     Medications:  Prescriptions Prior to Admission  Medication Sig Dispense Refill Last Dose  . Alpha-Lipoic Acid 600 MG CAPS Take 600 mg by mouth daily.    12/31/2016 at am  . carvedilol (COREG) 25 MG tablet Take 25 mg by mouth 2 (two) times daily.    12/31/2016 at 800  . Cholecalciferol (VITAMIN D-3) 5000 UNITS TABS Take 10,000 Units by mouth daily.    12/31/2016 at am  . diclofenac sodium (VOLTAREN) 1 % GEL Apply 1 application topically 4 (four) times daily as needed (pain).   few days ago  . diltiazem (TIAZAC) 300 MG 24 hr capsule Take 300 mg by mouth at bedtime.    12/30/2016 at pm  . fluticasone (FLONASE) 50 MCG/ACT nasal spray Place 1 spray into both nostrils daily as needed (seasonal allergies).    few months ago  . glipiZIDE (GLUCOTROL) 10 MG tablet Take 15 mg by mouth 2 (two) times daily.    12/31/2016 at am  . losartan (COZAAR) 100 MG tablet Take 100 mg by mouth daily.   12/31/2016 at am  . Magnesium 100 MG TABS Take 100 mg by mouth daily.   12/31/2016 at am  . metFORMIN (GLUCOPHAGE) 850 MG tablet Take 850 mg by mouth 2 (two) times daily with a meal.    12/31/2016 at am  . Multiple Vitamin (MULTIVITAMIN WITH MINERALS) TABS tablet Take 2  tablets by mouth daily.   12/31/2016 at am  . naproxen sodium (ALEVE) 220 MG tablet Take 440 mg by mouth daily as needed (pain).   few days ago  . OVER THE COUNTER MEDICATION Take 1 tablet by mouth 2 (two) times daily. Super Beta Prostate   12/31/2016 at am  . OVER THE COUNTER MEDICATION Take 2 capsules by mouth daily. Krill Oil with CoQ10   12/31/2016 at am  . ranitidine (ZANTAC) 150 MG tablet Take 150 mg by mouth daily before breakfast.   12/31/2016 at am  . sodium chloride (OCEAN) 0.65 % SOLN nasal spray Place 1 spray into both nostrils 2 (two) times daily as needed for congestion.   12/29/2016   Scheduled:  . [START ON 01/01/2017] aspirin EC  81 mg Oral Daily  . carvedilol  25 mg Oral BID  . [START ON 01/01/2017] cholecalciferol  10,000 Units Oral Daily  . diltiazem  300 mg Oral QHS  . [START ON 01/01/2017] famotidine  20 mg Oral Daily  . heparin  5,000 Units Subcutaneous Q8H  . [START ON 01/01/2017] insulin aspart  0-15 Units Subcutaneous TID WC  . [START ON 01/01/2017] losartan  100 mg Oral Daily  . [START ON 01/01/2017] magnesium oxide  400 mg Oral Daily  . [START ON  01/01/2017] multivitamin with minerals  2 tablet Oral Daily    Assessment: 71 yo male with elevated troponin. Pharmacy consulted to dose lovenox for r/o ACS.  -Hg= 11.8, plt= 251, CrCl ~ 100  Goal of Therapy:  Monitor platelets by anticoagulation protocol: Yes   Plan:  -Lovenox 110mg  [place] q12h -CBC every 3 days  Hildred Laser, Pharm D 12/31/2016 7:53 PM

## 2016-12-31 NOTE — Plan of Care (Signed)
Troponin resulted at 1.03, pt remains CP free at this time and otherwise clinically stable.  I've ordered treatment dose enoxaparin, pt already on ASA, and will defer P2Y12 for now.  NPO at midnight for likely cath in the am.  Clayborne Dana MD

## 2016-12-31 NOTE — H&P (Signed)
Patient ID: William Orr MRN: [id number], DOB/AGE: 70-Aug-1947   Admit date: 12/31/2016   Primary Physician: [name], MD Primary Cardiologist: [name]  Pt. Profile:  William Orr is a 71 y.o. male with a history of hypertension, hyperlipidemia, diabetes and sleep apnea on CPAP who transferred from [facility] with chest pain.  HPI: As above. He  underwent an exercise nuclear study in [place] in 2014 for atypical chest pain which revealed normal perfusion with an ejection fraction of 61%. There was no evidence for ischemia and he had normal wall motion. He was hypertensive throughout the study. Echocardiogram January 2017 showed normal LV function, grade 2 diastolic dysfunction, mildly reduced at RV function. Last seen by [name] 01/02/2016. He has appointment with [name] tomorrow.  This morning around 3:30 AM patient woke up with a substernal burning sensation with chest tightness. He felt like indigestion. SBP was 170s. Symptoms somewhat improved after drinking baking soda and water but did not resolve. Later he had developed some left arm numbness without radiation. No associated shortness of breath, nausea or vomiting. Symptoms lasted for greater than 4 hours and he went to [facility] ER. His symptoms improved after morphine, GI cocktail and Nitropatch. His troponin trended up to 0.07 from 0.04 and transferred to [facility] for further evaluation. EKG shows sinus bradycardia at rate of 57 bpm. No acute changes. He is currently chest pain-free on Nitropatch.  Recently complain of exertional fatigue and tiredness without dyspnea or chest pain. No regular exercise. Blood pressure has been well controlled regularly on current medications.   Problem List  Past Medical History:  Diagnosis Date  . Diabetes mellitus without complication ([place])   . Hyperlipidemia   . Hypertension     No past surgical history on file.   Allergies  Allergies  Allergen  Reactions  . Lisinopril Cough     Home Medications  Prior to Admission medications   Medication Sig Start Date End Date Taking? Authorizing Provider  Alpha-Lipoic Acid 600 MG CAPS Take 1 capsule by mouth daily.    [provider]  Ashwagandha 500 MG CAPS Take 1 capsule by mouth daily.    [provider]  carvedilol (COREG) 25 MG tablet Take 1 tablet by mouth 2 (two) times daily. 08/22/13   [provider]  Cholecalciferol (VITAMIN D-3) 5000 UNITS TABS Take 1 tablet by mouth daily.    [provider]  diltiazem (TIAZAC) 300 MG 24 hr capsule Take 300 mg by mouth daily.    [provider]  fluticasone (FLONASE) 50 MCG/ACT nasal spray Place 1 spray into both nostrils daily. 08/11/13   [provider]  glipiZIDE (GLUCOTROL) 10 MG tablet Take 10 mg by mouth 2 (two) times daily. 02/22/14   [provider]  losartan-hydrochlorothiazide (HYZAAR) 100-25 MG per tablet Take 1 tablet by mouth daily. 07/28/13   [provider]  Lutein 6 MG CAPS Take 1 capsule by mouth daily.    [provider]  Magnesium 500 MG CAPS Take 1 capsule by mouth daily.    [provider]  metFORMIN (GLUCOPHAGE) 850 MG tablet Take by mouth. 1 tablet in the morning and 2 tablets in thePM 08/19/13   [provider]  Multiple Vitamins-Minerals (MULTIVITAMIN PO) Take 1 capsule by mouth daily.    [provider]  Omega-3 1000 MG CAPS Take 1 capsule by mouth 3 (three) times daily.    [provider]  omeprazole (PRILOSEC) 20  MG capsule Take 20 mg by mouth as needed. 04/04/14   [provider]  OVER THE COUNTER MEDICATION Take 1 tablet by mouth 2 (two) times daily. superbeta  prostate    [provider]    Family History  Family History  Problem Relation Age of Onset  . Diabetes Mother   . Cancer - Prostate Father   . Rheum arthritis Sister   . Diabetes Brother   . Cancer - Other Brother         pancreatic  . Rheum arthritis Brother   . Hypertension Son   . Diabetes type II Son    Family Status  Relation Status  . Mother Deceased  . Father Deceased  . Sister Deceased  . Brother Deceased    Social History  Social History   Social History  . Marital status: Married    Spouse name: N/A  . Number of children: N/A  . Years of education: N/A   Occupational History  . Not on file.   Social History Main Topics  . Smoking status: Former Smoker    Types: Pipe  . Smokeless tobacco: Never Used     Comment: quit smoking pipe 30 years ago.  . Alcohol use No  . Drug use: Unknown  . Sexual activity: Not on file   Other Topics Concern  . Not on file   Social History Narrative  . No narrative on file     All other systems reviewed and are otherwise negative except as noted above.  Physical Exam  Blood pressure 136/78, pulse 61, temperature 98 F (36.7 C), temperature source Oral, resp. rate 12, height 6\' 1"  (1.854 m), weight 248 lb 4.8 oz (112.6 kg), SpO2 100 %.  General: Pleasant, NAD Psych: Normal affect. Neuro: Alert and oriented X 3. Moves all extremities spontaneously. HEENT: Normal  Neck: Supple without bruits or JVD. Lungs:  Resp regular and unlabored, CTA. Heart: RRR no s3, s4, or murmurs. Abdomen: Soft, non-tender, non-distended, BS + x 4.  Extremities: No clubbing, cyanosis or edema. DP/PT/Radials 2+ and equal bilaterally.  Labs  No results for input(s): CKTOTAL, CKMB, TROPONINI in the last 72 hours. Lab Results  Component Value Date   WBC 5.1 08/27/2015   HGB 12.9 (L) 09/29/2013   HCT 36.1 (L) 08/27/2015   MCV 91 08/27/2015   PLT 267 08/27/2015   No results for input(s): NA, K, CL, CO2, BUN, CREATININE, CALCIUM, PROT, BILITOT, ALKPHOS, ALT, AST, GLUCOSE in the last 168 hours.  Invalid input(s): LABALBU Lab Results  Component Value Date   CHOL 147 08/27/2015   HDL 27 (L) 08/27/2015   LDLCALC 53 08/27/2015   TRIG 335 (H) 08/27/2015   No  results found for: DDIMER   Radiology/Studies  No results found.  ASSESSMENT AND PLAN   1. Chest pain - Has typical and atypical features. Symptoms improved after GI cocktail, IV morphine and nitroglycerin patch. Currently chest pain-free. EKG shows sinus bradycardia at rate of 57 bpm without acute ST/T-wave changes. Troponin 0.04 --> 0.07. - Continue cycle troponin and serial EKG. Nothing by mouth after midnight. Stress test versus cardiac cath in the morning pending trend.  2. Hypertension - Control on home regimen. We will continue.  3. Hyperlipidemia - Check Lipid panel in AM.   4. DM - will place on SSI.   Signed, Leanor Kail, PA-C 12/31/2016, 4:26 PM Pager [phone]  I have seen and examined the patient along with Bhagat,Bhavinkumar, PA-C.  I have reviewed the  chart, notes and new data.  I agree with PA's note.  Key new complaints: symptoms have both a heartburn and a pressure component, woke him from sleep last night, but have been active for a month before that. Currently asymptomatic. Reports a history of problems with anemia, dark stools and hiatal hernia for years. Key examination changes: normal CV exam, bounding pulses Key new findings / data: ECG low risk, minimally abnormal troponin  PLAN: Possible unstable angina, abnormal troponin, but also prominent GI com-plaints. If troponin continues to rise, proceed directly to coronary angiography. Otherwise, a stress test would probably be the most appropriate next step. GI evaluation was already planned in [place] for later this week. Should go ahead with that if no cardiac issues are identified.  Sanda Klein, MD, [place] [phone] 12/31/2016, 5:03 PM

## 2016-12-31 NOTE — Progress Notes (Signed)
Pt blood sugar is [address] [place]

## 2017-01-01 ENCOUNTER — Observation Stay (HOSPITAL_BASED_OUTPATIENT_CLINIC_OR_DEPARTMENT_OTHER): Payer: Medicare Other

## 2017-01-01 ENCOUNTER — Ambulatory Visit: Payer: Medicare Other | Admitting: Cardiovascular Disease

## 2017-01-01 ENCOUNTER — Telehealth: Payer: Self-pay | Admitting: Cardiovascular Disease

## 2017-01-01 DIAGNOSIS — J9811 Atelectasis: Secondary | ICD-10-CM | POA: Diagnosis not present

## 2017-01-01 DIAGNOSIS — Z888 Allergy status to other drugs, medicaments and biological substances status: Secondary | ICD-10-CM | POA: Diagnosis not present

## 2017-01-01 DIAGNOSIS — G4733 Obstructive sleep apnea (adult) (pediatric): Secondary | ICD-10-CM | POA: Diagnosis present

## 2017-01-01 DIAGNOSIS — R072 Precordial pain: Secondary | ICD-10-CM

## 2017-01-01 DIAGNOSIS — R0602 Shortness of breath: Secondary | ICD-10-CM | POA: Diagnosis not present

## 2017-01-01 DIAGNOSIS — Z4682 Encounter for fitting and adjustment of non-vascular catheter: Secondary | ICD-10-CM | POA: Diagnosis not present

## 2017-01-01 DIAGNOSIS — Z7951 Long term (current) use of inhaled steroids: Secondary | ICD-10-CM | POA: Diagnosis not present

## 2017-01-01 DIAGNOSIS — J939 Pneumothorax, unspecified: Secondary | ICD-10-CM | POA: Diagnosis not present

## 2017-01-01 DIAGNOSIS — I214 Non-ST elevation (NSTEMI) myocardial infarction: Secondary | ICD-10-CM | POA: Diagnosis not present

## 2017-01-01 DIAGNOSIS — I2510 Atherosclerotic heart disease of native coronary artery without angina pectoris: Secondary | ICD-10-CM | POA: Diagnosis not present

## 2017-01-01 DIAGNOSIS — I440 Atrioventricular block, first degree: Secondary | ICD-10-CM | POA: Diagnosis not present

## 2017-01-01 DIAGNOSIS — I25110 Atherosclerotic heart disease of native coronary artery with unstable angina pectoris: Secondary | ICD-10-CM | POA: Diagnosis not present

## 2017-01-01 DIAGNOSIS — R001 Bradycardia, unspecified: Secondary | ICD-10-CM | POA: Diagnosis present

## 2017-01-01 DIAGNOSIS — E785 Hyperlipidemia, unspecified: Secondary | ICD-10-CM | POA: Diagnosis not present

## 2017-01-01 DIAGNOSIS — R609 Edema, unspecified: Secondary | ICD-10-CM | POA: Diagnosis not present

## 2017-01-01 DIAGNOSIS — I10 Essential (primary) hypertension: Secondary | ICD-10-CM | POA: Diagnosis not present

## 2017-01-01 DIAGNOSIS — R079 Chest pain, unspecified: Secondary | ICD-10-CM

## 2017-01-01 DIAGNOSIS — I371 Nonrheumatic pulmonary valve insufficiency: Secondary | ICD-10-CM | POA: Diagnosis not present

## 2017-01-01 DIAGNOSIS — E7800 Pure hypercholesterolemia, unspecified: Secondary | ICD-10-CM

## 2017-01-01 DIAGNOSIS — I252 Old myocardial infarction: Secondary | ICD-10-CM | POA: Diagnosis not present

## 2017-01-01 DIAGNOSIS — E119 Type 2 diabetes mellitus without complications: Secondary | ICD-10-CM | POA: Diagnosis not present

## 2017-01-01 DIAGNOSIS — Z794 Long term (current) use of insulin: Secondary | ICD-10-CM | POA: Diagnosis not present

## 2017-01-01 DIAGNOSIS — Z8249 Family history of ischemic heart disease and other diseases of the circulatory system: Secondary | ICD-10-CM | POA: Diagnosis not present

## 2017-01-01 DIAGNOSIS — Z951 Presence of aortocoronary bypass graft: Secondary | ICD-10-CM | POA: Diagnosis not present

## 2017-01-01 DIAGNOSIS — D62 Acute posthemorrhagic anemia: Secondary | ICD-10-CM | POA: Diagnosis not present

## 2017-01-01 DIAGNOSIS — Z01810 Encounter for preprocedural cardiovascular examination: Secondary | ICD-10-CM | POA: Diagnosis not present

## 2017-01-01 DIAGNOSIS — Z79899 Other long term (current) drug therapy: Secondary | ICD-10-CM | POA: Diagnosis not present

## 2017-01-01 DIAGNOSIS — Z87891 Personal history of nicotine dependence: Secondary | ICD-10-CM | POA: Diagnosis not present

## 2017-01-01 DIAGNOSIS — I25810 Atherosclerosis of coronary artery bypass graft(s) without angina pectoris: Secondary | ICD-10-CM | POA: Diagnosis not present

## 2017-01-01 LAB — BASIC METABOLIC PANEL
Anion gap: 7 (ref 5–15)
BUN: 11 mg/dL (ref 6–20)
CALCIUM: 9.1 mg/dL (ref 8.9–10.3)
CO2: 27 mmol/L (ref 22–32)
CREATININE: 0.9 mg/dL (ref 0.61–1.24)
Chloride: 102 mmol/L (ref 101–111)
GFR calc Af Amer: >60 mL/min (ref >60)
GFR calc non Af Amer: >60 mL/min (ref >60)
GLUCOSE: 174 mg/dL — AB (ref 65–99)
Potassium: 3.6 mmol/L (ref 3.5–5.1)
Sodium: 136 mmol/L (ref 135–145)

## 2017-01-01 LAB — LIPID PANEL
Cholesterol: 116 mg/dL (ref 0–200)
HDL: 26 mg/dL — AB (ref >40)
LDL CALC: UNDETERMINED mg/dL (ref 0–99)
Total CHOL/HDL Ratio: 4.5 RATIO
Triglycerides: 413 mg/dL — ABNORMAL HIGH (ref <150)
VLDL: UNDETERMINED mg/dL (ref 0–40)

## 2017-01-01 LAB — TROPONIN I
TROPONIN I: 0.85 ng/mL — AB (ref <0.03)
Troponin I: 0.53 ng/mL (ref <0.03)

## 2017-01-01 LAB — GLUCOSE, CAPILLARY
GLUCOSE-CAPILLARY: 140 mg/dL — AB (ref 65–99)
GLUCOSE-CAPILLARY: 144 mg/dL — AB (ref 65–99)
GLUCOSE-CAPILLARY: 165 mg/dL — AB (ref 65–99)
Glucose-Capillary: 116 mg/dL — ABNORMAL HIGH (ref 65–99)
Glucose-Capillary: 148 mg/dL — ABNORMAL HIGH (ref 65–99)

## 2017-01-01 LAB — CBC
HCT: 35.7 % — ABNORMAL LOW (ref 39.0–52.0)
Hemoglobin: 11.5 g/dL — ABNORMAL LOW (ref 13.0–17.0)
MCH: 29.9 pg (ref 26.0–34.0)
MCHC: 32.2 g/dL (ref 30.0–36.0)
MCV: 92.7 fL (ref 78.0–100.0)
PLATELETS: 231 10*3/uL (ref 150–400)
RBC: 3.85 MIL/uL — ABNORMAL LOW (ref 4.22–5.81)
RDW: 13.8 % (ref 11.5–15.5)
WBC: 4.4 10*3/uL (ref 4.0–10.5)

## 2017-01-01 LAB — ECHOCARDIOGRAM COMPLETE
HEIGHTINCHES: 73 in
Weight: 3972.8 oz

## 2017-01-01 LAB — HEPARIN LEVEL (UNFRACTIONATED): HEPARIN UNFRACTIONATED: 0.27 [IU]/mL — AB (ref 0.30–0.70)

## 2017-01-01 MED ORDER — HEPARIN (PORCINE) IN NACL 100-0.45 UNIT/ML-% IJ SOLN
1200.0000 [IU]/h | INTRAMUSCULAR | Status: DC
  Administered 2017-01-01: 1200 [IU]/h via INTRAVENOUS
  Filled 2017-01-01: qty 250

## 2017-01-01 MED ORDER — HEPARIN (PORCINE) IN NACL 100-0.45 UNIT/ML-% IJ SOLN
1500.0000 [IU]/h | INTRAMUSCULAR | Status: DC
  Administered 2017-01-02: 1500 [IU]/h via INTRAVENOUS
  Filled 2017-01-01: qty 250

## 2017-01-01 MED ORDER — VITAMIN D 1000 UNITS PO TABS
1000.0000 [IU] | ORAL_TABLET | Freq: Every day | ORAL | Status: DC
  Administered 2017-01-01 – 2017-01-03 (×3): 1000 [IU] via ORAL
  Filled 2017-01-01 (×2): qty 1

## 2017-01-01 NOTE — Progress Notes (Signed)
ANTICOAGULATION CONSULT NOTE - Follow Up Consult  Pharmacy Consult for Heparin Indication: chest pain/ACS  Allergies  Allergen Reactions  . Lisinopril Cough    Patient Measurements: Height: 6\' 1"  (185.4 cm) Weight: 248 lb 4.8 oz (112.6 kg) IBW/kg (Calculated) : 79.9 Heparin Dosing Weight: 103.6kg  Vital Signs: Temp: 98.5 F (36.9 C) (05/21 1443) Temp Source: Oral (05/21 1443) BP: 164/91 (05/21 1443) Pulse Rate: 64 (05/21 1443)  Labs:  Recent Labs  12/31/16 1747 01/01/17 0042 01/01/17 0752 01/01/17 1818  HGB 11.8* 11.5*  --   --   HCT 37.5* 35.7*  --   --   PLT 251 231  --   --   HEPARINUNFRC  --   --   --  0.27*  CREATININE 0.92 0.90  --   --   TROPONINI 1.03* 0.85* 0.53*  --     Estimated Creatinine Clearance: 101.9 mL/min (by C-G formula based on SCr of 0.9 mg/dL).   Medications:  Heparin @ 1200 units/hr  Assessment: 70yom transitioned from lovenox to heparin earlier today for chest pain with plan for stress test tomorrow. Heparin level is below goal at 0.27. No bleeding.  Goal of Therapy:  Heparin level 0.3-0.7 units/ml Monitor platelets by anticoagulation protocol: Yes   Plan:  1) Increase heparin to 1400 units/hr 2) Check 6 hour heparin level  Deboraha Sprang 01/01/2017,7:14 PM

## 2017-01-01 NOTE — Care Management Obs Status (Signed)
[place] NOTIFICATION   Patient Details  Name: William Orr MRN: [id number] Date of Birth: 16-Aug-1945   Medicare Observation Status Notification Given:  Yes    Bethena Roys, RN 01/01/2017, 10:19 AM

## 2017-01-01 NOTE — Telephone Encounter (Signed)
New message     Please call pt would like to speak with you , he is admitted to [facility] hospital

## 2017-01-01 NOTE — Progress Notes (Addendum)
[place] for lovenox to heparin Indication: chest pain/ACS  Allergies  Allergen Reactions  . Lisinopril Cough    Patient Measurements: Height: 6\' 1"  (185.4 cm) Weight: 248 lb 4.8 oz (112.6 kg) IBW/kg (Calculated) : 79.9   Vital Signs: Pulse Rate: 65 (05/21 0620)  Labs:  Recent Labs  12/31/16 1747 01/01/17 0042 01/01/17 0752  HGB 11.8* 11.5*  --   HCT 37.5* 35.7*  --   PLT 251 231  --   CREATININE 0.92 0.90  --   TROPONINI 1.03* 0.85* 0.53*    Estimated Creatinine Clearance: 101.9 mL/min (by C-G formula based on SCr of 0.9 mg/dL).    Assessment: 71 yo male with elevated troponin. Pharmacy consulted to transition to heparin for chest pain Last dose of Lovenox at 10 pm 5/20   Goal of Therapy:  Monitor platelets by anticoagulation protocol: Yes   Plan:  Heparin at 1200 units / hr 8 hour heparin level Daily heparin level, CBC   Thank you Anette Guarneri, PharmD [phone]  01/01/2017 9:37 AM

## 2017-01-01 NOTE — Progress Notes (Signed)
William Orr is a 71 y.o. male with a history of hypertension, hyperlipidemia, diabetes and sleep apnea on CPAP who transferred from [facility] with chest pain. He underwent an exercise nuclear study in [place] in 2014 for atypical chest pain which revealed normal perfusion with an ejection fraction of 61%. There was no evidence for ischemia and he had normal wall motion. He was hypertensive throughout the study. Echocardiogram January 2017 showed normal LV function, grade 2 diastolic dysfunction, mildly reduced at RV function. Last seen by [name] 01/02/2016.   Subjective:   No acute events overnight. Pt denies any chest pain, SOB, palpitations, dizziness. He denies any reflux symptoms. He feels a lot better.  He says that he tried simvastatin in the past but had some' knee pain'. So he stopped it. Says he always had high TGs for 20 years or so.      Objective:  Temp:  [98 F (36.7 C)-98.6 F (37 C)] 98.6 F (37 C) (05/20 2024) Pulse Rate:  [61-65] 65 (05/21 0620) Resp:  [12-17] 17 (05/21 0620) BP: (136-138)/(66-78) 138/66 (05/20 2024) SpO2:  [100 %] 100 % (05/20 2024) Weight:  [248 lb 4.8 oz (112.6 kg)] 248 lb 4.8 oz (112.6 kg) (05/20 1605) Weight change:   Intake/Output from previous day: 05/20 0701 - 05/21 0700 In: 360 [P.O.:360] Out: -   Intake/Output from this shift: No intake/output data recorded.  Medications: Current Facility-Administered Medications  Medication Dose Route Frequency Provider Last Rate Last Dose  . acetaminophen (TYLENOL) tablet 650 mg  650 mg Oral Q4H PRN Bhagat, Bhavinkumar, PA   650 mg at 12/31/16 2226  . aspirin EC tablet 81 mg  81 mg Oral Daily Bhagat, Bhavinkumar, PA      . carvedilol (COREG) tablet 25 mg  25 mg Oral BID Bhagat, Bhavinkumar, PA   25 mg at 12/31/16 2211  . cholecalciferol (VITAMIN D) tablet 10,000 Units  10,000 Units Oral Daily Bhagat, Bhavinkumar, PA      . diltiazem (TIAZAC) 24 hr capsule 300 mg  300 mg Oral QHS  Bhagat, Bhavinkumar, PA   300 mg at 12/31/16 2211  . famotidine (PEPCID) tablet 20 mg  20 mg Oral Daily Bhagat, Bhavinkumar, PA      . fluticasone (FLONASE) 50 MCG/ACT nasal spray 1 spray  1 spray Each Nare Daily PRN Bhagat, Bhavinkumar, PA      . insulin aspart (novoLOG) injection 0-15 Units  0-15 Units Subcutaneous TID WC Bhagat, Bhavinkumar, PA   2 Units at 01/01/17 0815  . losartan (COZAAR) tablet 100 mg  100 mg Oral Daily Bhagat, Bhavinkumar, PA      . magnesium oxide (MAG-OX) tablet 400 mg  400 mg Oral Daily Croitoru, Mihai, MD      . multivitamin with minerals tablet 2 tablet  2 tablet Oral Daily Bhagat, Bhavinkumar, PA      . nitroGLYCERIN (NITROSTAT) SL tablet 0.4 mg  0.4 mg Sublingual Q5 Min x 3 PRN Bhagat, Bhavinkumar, PA      . ondansetron (ZOFRAN) injection 4 mg  4 mg Intravenous Q6H PRN Leanor Kail, PA        Physical Exam:  General: Vital signs reviewed. Patient in no acute distress Cardiovascular: regular rate, rhythm, no murmur appreciated  Pulmonary/Chest: Clear to auscultation bilaterally, no wheezes, rales, or rhonchi. Abdominal: Soft, non-tender, non-distended, BS + Extremities: No lower extremity edema bilaterally, pulses symmetric and intact bilaterally. Skin: Warm, dry and intact. No rashes or erythema.    Lab Results: Results  for orders placed or performed during the hospital encounter of 12/31/16 (from the past 48 hour(s))  Glucose, capillary     Status: None   Collection Time: 12/31/16  4:09 PM  Result Value Ref Range   Glucose-Capillary 70 65 - 99 mg/dL  CBC     Status: Abnormal   Collection Time: 12/31/16  5:47 PM  Result Value Ref Range   WBC 4.8 4.0 - 10.5 K/uL   RBC 4.01 (L) 4.22 - 5.81 MIL/uL   Hemoglobin 11.8 (L) 13.0 - 17.0 g/dL   HCT 37.5 (L) 39.0 - 52.0 %   MCV 93.5 78.0 - 100.0 fL   MCH 29.4 26.0 - 34.0 pg   MCHC 31.5 30.0 - 36.0 g/dL   RDW 13.7 11.5 - 15.5 %   Platelets 251 150 - 400 K/uL  Creatinine, serum     Status: None    Collection Time: 12/31/16  5:47 PM  Result Value Ref Range   Creatinine, Ser 0.92 0.61 - 1.24 mg/dL   GFR calc non Af Amer >60 >60 mL/min   GFR calc Af Amer >60 >60 mL/min    Comment: (NOTE) The eGFR has been calculated using the CKD EPI equation. This calculation has not been validated in all clinical situations. eGFR's persistently <60 mL/min signify possible Chronic Kidney Disease.   Troponin I (q 6hr x 3)     Status: Abnormal   Collection Time: 12/31/16  5:47 PM  Result Value Ref Range   Troponin I 1.03 (HH) <0.03 ng/mL    Comment: CRITICAL RESULT CALLED TO, READ BACK BY AND VERIFIED WITH: RN Morton Stall AT 1939 [id number] MARTINB   Glucose, capillary     Status: Abnormal   Collection Time: 12/31/16  8:28 PM  Result Value Ref Range   Glucose-Capillary 140 (H) 65 - 99 mg/dL  Basic metabolic panel     Status: Abnormal   Collection Time: 01/01/17 12:42 AM  Result Value Ref Range   Sodium 136 135 - 145 mmol/L   Potassium 3.6 3.5 - 5.1 mmol/L   Chloride 102 101 - 111 mmol/L   CO2 27 22 - 32 mmol/L   Glucose, Bld 174 (H) 65 - 99 mg/dL   BUN 11 6 - 20 mg/dL   Creatinine, Ser 0.90 0.61 - 1.24 mg/dL   Calcium 9.1 8.9 - 10.3 mg/dL   GFR calc non Af Amer >60 >60 mL/min   GFR calc Af Amer >60 >60 mL/min    Comment: (NOTE) The eGFR has been calculated using the CKD EPI equation. This calculation has not been validated in all clinical situations. eGFR's persistently <60 mL/min signify possible Chronic Kidney Disease.    Anion gap 7 5 - 15  CBC     Status: Abnormal   Collection Time: 01/01/17 12:42 AM  Result Value Ref Range   WBC 4.4 4.0 - 10.5 K/uL   RBC 3.85 (L) 4.22 - 5.81 MIL/uL   Hemoglobin 11.5 (L) 13.0 - 17.0 g/dL   HCT 35.7 (L) 39.0 - 52.0 %   MCV 92.7 78.0 - 100.0 fL   MCH 29.9 26.0 - 34.0 pg   MCHC 32.2 30.0 - 36.0 g/dL   RDW 13.8 11.5 - 15.5 %   Platelets 231 150 - 400 K/uL  Troponin I (q 6hr x 3)     Status: Abnormal   Collection Time: 01/01/17 12:42 AM  Result  Value Ref Range   Troponin I 0.85 (HH) <0.03 ng/mL    Comment: CRITICAL  VALUE NOTED.  VALUE IS CONSISTENT WITH PREVIOUSLY REPORTED AND CALLED VALUE.  Lipid panel     Status: Abnormal   Collection Time: 01/01/17 12:42 AM  Result Value Ref Range   Cholesterol 116 0 - 200 mg/dL   Triglycerides 413 (H) <150 mg/dL   HDL 26 (L) >40 mg/dL   Total CHOL/HDL Ratio 4.5 RATIO   VLDL UNABLE TO CALCULATE IF TRIGLYCERIDE OVER 400 mg/dL 0 - 40 mg/dL   LDL Cholesterol UNABLE TO CALCULATE IF TRIGLYCERIDE OVER 400 mg/dL 0 - 99 mg/dL    Comment:        Total Cholesterol/HDL:CHD Risk Coronary Heart Disease Risk Table                     Men   Women  1/2 Average Risk   3.4   3.3  Average Risk       5.0   4.4  2 X Average Risk   9.6   7.1  3 X Average Risk  23.4   11.0        Use the calculated Patient Ratio above and the CHD Risk Table to determine the patient's CHD Risk.        ATP III CLASSIFICATION (LDL):  <100     mg/dL   Optimal  100-129  mg/dL   Near or Above                    Optimal  130-159  mg/dL   Borderline  160-189  mg/dL   High  >190     mg/dL   Very High   Glucose, capillary     Status: Abnormal   Collection Time: 01/01/17  7:19 AM  Result Value Ref Range   Glucose-Capillary 140 (H) 65 - 99 mg/dL  Troponin I (q 6hr x 3)     Status: Abnormal   Collection Time: 01/01/17  7:52 AM  Result Value Ref Range   Troponin I 0.53 (HH) <0.03 ng/mL    Comment: CRITICAL VALUE NOTED.  VALUE IS CONSISTENT WITH PREVIOUSLY REPORTED AND CALLED VALUE.    Assessment:  Plan:  Chest pain with typical and atypical features presenting as possible unstable angina: His symptoms had improved after GI cocktail, morphine andnitro patch. Overnight, troponins have trended down . Repeat EKG shows NSR with 1st AV block, and nonspecific T wave changes.  Lipid panel shows triglyceridesof 413, and low HDL. He has been chest pain free.  Trops 1.03--> 0.85.  Echo Jan 2017 shows EF 60-65% and normal wall motion  without wall motion abnormalities and G2DD.  -currently on heparin gtt -NPO  after midnight for possible stress test tomorrow  -we will order an echo to see if there is significant change from prior echo.  -continue with GI evaluation in [place] this week  HTN: Pt is normotensive   - continue coreg, diltiazem, losartan   HLD:  -Pt will need to be on a statin.  -With his high TG, he may also need further workup outpatient to look for familial causes of hyperTG. TSh in Jan 2017 was within normal range.     Length of Stay:  LOS: 1 day    Burgess Estelle MD MPH PGY2 Internal Medicine Teaching Service  01/01/2017, 9:22 AM

## 2017-01-01 NOTE — Plan of Care (Signed)
Problem: Education: Goal: Knowledge of [facility] Education information/materials will improve Outcome: Progressing Patient aware of plan of care.  RN provided medication education on all medications administered prior to administration.  Patient stated understanding.

## 2017-01-01 NOTE — Care Management Note (Addendum)
Case Management Note  Patient Details  Name: William Orr MRN: [id number] Date of Birth: 02/01/1946  Subjective/Objective: Pt presented as a transfer from [facility] for Chest Pain. PTA pt was independent from home with wife. Plan will be to return home once stable.                    Action/Plan: CM to fax new Medicare Cards to Admitting. No Home Needs identified by CM at this time.   Expected Discharge Date:                  Expected Discharge Plan:  Home/Self Care  In-House Referral:  NA  Discharge planning Services  NA  Post Acute Care Choice:  NA Choice offered to:  NA  DME Arranged:  N/A DME Agency:  NA  HH Arranged:  NA HH Agency:  NA  Status of Service:  Completed, signed off  If discussed at [place] of Stay Meetings, dates discussed:    Additional Comments:  Bethena Roys, RN 01/01/2017, 10:20 AM

## 2017-01-01 NOTE — Telephone Encounter (Signed)
Spoke with the patient. He is currently in the hospital and wanted to verify that [name] knew and that his appointment was cancelled for today.

## 2017-01-01 NOTE — Progress Notes (Signed)
  Echocardiogram 2D Echocardiogram has been performed.  Darlina Sicilian M 01/01/2017, 12:51 PM

## 2017-01-02 ENCOUNTER — Encounter (HOSPITAL_COMMUNITY)
Admission: AD | Disposition: A | Discharge status: Home / Self Care | Payer: Self-pay | Source: Other Acute Inpatient Hospital | Attending: Thoracic Surgery (Cardiothoracic Vascular Surgery)

## 2017-01-02 HISTORY — PX: LEFT HEART CATH AND CORONARY ANGIOGRAPHY: CATH118249

## 2017-01-02 LAB — GLUCOSE, CAPILLARY
Glucose-Capillary: 160 mg/dL — ABNORMAL HIGH (ref 65–99)
Glucose-Capillary: 150 mg/dL — ABNORMAL HIGH (ref 65–99)
Glucose-Capillary: 113 mg/dL — ABNORMAL HIGH (ref 65–99)
Glucose-Capillary: 178 mg/dL — ABNORMAL HIGH (ref 65–99)

## 2017-01-02 LAB — CBC
HEMATOCRIT: 37.2 % — AB (ref 39.0–52.0)
HEMOGLOBIN: 12 g/dL — AB (ref 13.0–17.0)
MCH: 29.9 pg (ref 26.0–34.0)
MCHC: 32.3 g/dL (ref 30.0–36.0)
MCV: 92.8 fL (ref 78.0–100.0)
Platelets: 267 10*3/uL (ref 150–400)
RBC: 4.01 MIL/uL — AB (ref 4.22–5.81)
RDW: 13.8 % (ref 11.5–15.5)
WBC: 4.4 10*3/uL (ref 4.0–10.5)

## 2017-01-02 LAB — HEPARIN LEVEL (UNFRACTIONATED): Heparin Unfractionated: 0.3 IU/mL (ref 0.30–0.70)

## 2017-01-02 LAB — PROTIME-INR
INR: 1.09
PROTHROMBIN TIME: 14.1 s (ref 11.4–15.2)

## 2017-01-02 SURGERY — LEFT HEART CATH AND CORONARY ANGIOGRAPHY
Anesthesia: LOCAL

## 2017-01-02 MED ORDER — IOPAMIDOL (ISOVUE-370) INJECTION 76%
INTRAVENOUS | Status: AC
  Filled 2017-01-02: qty 100

## 2017-01-02 MED ORDER — SODIUM CHLORIDE 0.9 % WEIGHT BASED INFUSION
1.0000 mL/kg/h | INTRAVENOUS | Status: DC
  Administered 2017-01-02: 1 mL/kg/h via INTRAVENOUS

## 2017-01-02 MED ORDER — FENTANYL CITRATE (PF) 100 MCG/2ML IJ SOLN
INTRAMUSCULAR | Status: DC | PRN
  Administered 2017-01-02: 50 ug via INTRAVENOUS

## 2017-01-02 MED ORDER — LIDOCAINE HCL 1 % IJ SOLN
INTRAMUSCULAR | Status: AC
  Filled 2017-01-02: qty 20

## 2017-01-02 MED ORDER — FENTANYL CITRATE (PF) 100 MCG/2ML IJ SOLN
INTRAMUSCULAR | Status: AC
  Filled 2017-01-02: qty 2

## 2017-01-02 MED ORDER — MIDAZOLAM HCL 2 MG/2ML IJ SOLN
INTRAMUSCULAR | Status: DC | PRN
  Administered 2017-01-02 (×2): 1 mg via INTRAVENOUS

## 2017-01-02 MED ORDER — MIDAZOLAM HCL 2 MG/2ML IJ SOLN
INTRAMUSCULAR | Status: AC
  Filled 2017-01-02: qty 2

## 2017-01-02 MED ORDER — SODIUM CHLORIDE 0.9 % WEIGHT BASED INFUSION
1.0000 mL/kg/h | INTRAVENOUS | Status: AC
  Administered 2017-01-02: 1 mL/kg/h via INTRAVENOUS

## 2017-01-02 MED ORDER — HEPARIN (PORCINE) IN NACL 2-0.9 UNIT/ML-% IJ SOLN
INTRAMUSCULAR | Status: AC | PRN
  Administered 2017-01-02: 1000 mL

## 2017-01-02 MED ORDER — ASPIRIN 81 MG PO CHEW: 81.0000 mg | CHEWABLE_TABLET | ORAL | Status: AC

## 2017-01-02 MED ORDER — SODIUM CHLORIDE 0.9% FLUSH
3.0000 mL | Freq: Two times a day (BID) | INTRAVENOUS | Status: DC
  Administered 2017-01-02: 3 mL via INTRAVENOUS

## 2017-01-02 MED ORDER — HEPARIN SODIUM (PORCINE) 1000 UNIT/ML IJ SOLN
INTRAMUSCULAR | Status: DC | PRN
  Administered 2017-01-02: 5500 [IU] via INTRAVENOUS

## 2017-01-02 MED ORDER — OXYCODONE-ACETAMINOPHEN 5-325 MG PO TABS: 1.0000 | ORAL_TABLET | ORAL | Status: DC | PRN

## 2017-01-02 MED ORDER — HEPARIN (PORCINE) IN NACL 100-0.45 UNIT/ML-% IJ SOLN
1600.0000 [IU]/h | INTRAMUSCULAR | Status: DC
  Administered 2017-01-03: 1500 [IU]/h via INTRAVENOUS
  Filled 2017-01-02 (×2): qty 250

## 2017-01-02 MED ORDER — VERAPAMIL HCL 2.5 MG/ML IV SOLN
INTRAVENOUS | Status: AC
  Filled 2017-01-02: qty 2

## 2017-01-02 MED ORDER — SODIUM CHLORIDE 0.9 % WEIGHT BASED INFUSION: 3.0000 mL/kg/h | INTRAVENOUS | Status: DC

## 2017-01-02 MED ORDER — ONDANSETRON HCL 4 MG/2ML IJ SOLN: 4.0000 mg | Freq: Four times a day (QID) | INTRAMUSCULAR | Status: DC | PRN

## 2017-01-02 MED ORDER — SODIUM CHLORIDE 0.9% FLUSH
3.0000 mL | Freq: Two times a day (BID) | INTRAVENOUS | Status: DC
  Administered 2017-01-03 (×2): 3 mL via INTRAVENOUS

## 2017-01-02 MED ORDER — ASPIRIN 81 MG PO CHEW
81.0000 mg | CHEWABLE_TABLET | Freq: Every day | ORAL | Status: DC
  Administered 2017-01-03: 81 mg via ORAL
  Filled 2017-01-02: qty 1

## 2017-01-02 MED ORDER — SODIUM CHLORIDE 0.9% FLUSH: 3.0000 mL | INTRAVENOUS | Status: DC | PRN

## 2017-01-02 MED ORDER — HEPARIN SODIUM (PORCINE) 1000 UNIT/ML IJ SOLN
INTRAMUSCULAR | Status: AC
  Filled 2017-01-02: qty 1

## 2017-01-02 MED ORDER — LIDOCAINE HCL (PF) 1 % IJ SOLN
INTRAMUSCULAR | Status: DC | PRN
  Administered 2017-01-02: 2 mL

## 2017-01-02 MED ORDER — SODIUM CHLORIDE 0.9 % IV SOLN: 250.0000 mL | INTRAVENOUS | Status: DC | PRN

## 2017-01-02 MED ORDER — IOPAMIDOL (ISOVUE-370) INJECTION 76%
INTRAVENOUS | Status: DC | PRN
  Administered 2017-01-02: 100 mL via INTRA_ARTERIAL

## 2017-01-02 MED ORDER — ACETAMINOPHEN 325 MG PO TABS: 650.0000 mg | ORAL_TABLET | ORAL | Status: DC | PRN

## 2017-01-02 MED ORDER — HEPARIN (PORCINE) IN NACL 2-0.9 UNIT/ML-% IJ SOLN
INTRAMUSCULAR | Status: AC
Start: 2017-01-02 — End: 2017-01-02
  Filled 2017-01-02: qty 1000

## 2017-01-02 SURGICAL SUPPLY — 11 items
CATH IMPULSE 5F ANG/FL3.5 (CATHETERS) ×2 IMPLANT
COVER PRB 48X5XTLSCP FOLD TPE (BAG) ×1 IMPLANT
COVER PROBE 5X48 (BAG) ×1
DEVICE RAD COMP TR BAND LRG (VASCULAR PRODUCTS) ×2 IMPLANT
GLIDESHEATH SLEND A-KIT 6F 22G (SHEATH) ×2 IMPLANT
GUIDEWIRE INQWIRE 1.5J.035X260 (WIRE) ×1 IMPLANT
INQWIRE 1.5J .035X260CM (WIRE) ×2
KIT HEART LEFT (KITS) ×2 IMPLANT
PACK CARDIAC CATHETERIZATION (CUSTOM PROCEDURE TRAY) ×2 IMPLANT
TRANSDUCER W/STOPCOCK (MISCELLANEOUS) ×2 IMPLANT
TUBING CIL FLEX 10 FLL-RA (TUBING) ×2 IMPLANT

## 2017-01-02 NOTE — Plan of Care (Signed)
Problem: Education: Goal: Knowledge of [place] [facility] Education information/materials will improve Outcome: Progressing Patient aware of plan of care.  RN provided medication education on medications administered thus far this shift.  Patient stated understanding.  Patient denying pain.  RN instructed patient to notify RN if patient started to experience any pain.  Patient agreeable to RN's instruction.

## 2017-01-02 NOTE — Interval H&P Note (Signed)
Cath Lab Visit (complete for each Cath Lab visit)  Clinical Evaluation Leading to the Procedure:   ACS: Yes.    Non-ACS:    Anginal Classification: CCS Orr  Anti-ischemic medical therapy: Minimal Therapy (1 class of medications)  Non-Invasive Test Results: No non-invasive testing performed  Prior CABG: No previous CABG      History and Physical Interval Note:  01/02/2017 3:08 PM  William Orr  has presented today for surgery, with the diagnosis of cp  The various methods of treatment have been discussed with the patient and family. After consideration of risks, benefits and other options for treatment, the patient has consented to  Procedure(s): Left Heart Cath and Coronary Angiography (N/A) as a surgical intervention .  The patient's history has been reviewed, patient examined, no change in status, stable for surgery.  I have reviewed the patient's chart and labs.  Questions were answered to the patient's satisfaction.     William Orr

## 2017-01-02 NOTE — Progress Notes (Signed)
ANTICOAGULATION CONSULT NOTE - Follow Up Consult  Pharmacy Consult for Heparin Indication: chest pain/ACS  Allergies  Allergen Reactions  . Lisinopril Cough    Patient Measurements: Height: 6\' 1"  (185.4 cm) Weight: 242 lb 4.8 oz (109.9 kg) IBW/kg (Calculated) : 79.9 Heparin Dosing Weight: 103.6kg  Vital Signs: Temp: 98.5 F (36.9 C) (05/22 1449) Temp Source: Oral (05/22 1449) BP: 157/77 (05/22 1613) Pulse Rate: 0 (05/22 1618)  Labs:  Recent Labs  12/31/16 1747 01/01/17 0042 01/01/17 0752 01/01/17 1818 01/02/17 0139 01/02/17 0448 01/02/17 1203  HGB 11.8* 11.5*  --   --   --  12.0*  --   HCT 37.5* 35.7*  --   --   --  37.2*  --   PLT 251 231  --   --   --  267  --   LABPROT  --   --   --   --   --   --  14.1  INR  --   --   --   --   --   --  1.09  HEPARINUNFRC  --   --   --  0.27* 0.30  --   --   CREATININE 0.92 0.90  --   --   --   --   --   TROPONINI 1.03* 0.85* 0.53*  --   --   --   --     Estimated Creatinine Clearance: 100.7 mL/min (by C-G formula based on SCr of 0.9 mg/dL).   Assessment: 70yom on IV heparin for ACS prior to cath lab.  Now s/p cath, awaiting CABG consult.  Pharmacy asked to resume IV heparin 8 hrs after sheath removed.  Radial sheath removed at 1615 PM.    Goal of Therapy:  Heparin level 0.3-0.7 units/ml Monitor platelets by anticoagulation protocol: Yes   Plan:  -Restart IV heparin at previous rate of 1500 units/hr at 00:15 tonight -Check heparin level 6 hrs after gtt starts. -Daily heparin level and CBC.  -F/u plans for CABG.  Uvaldo Rising, BCPS  Clinical Pharmacist Pager [phone]  01/02/2017 4:55 PM

## 2017-01-02 NOTE — Progress Notes (Signed)
William Orr is a 71 y.o. male with a history of hypertension, hyperlipidemia, diabetes and sleep apnea on CPAP who transferred from [facility] with chest pain. He underwent an exercise nuclear study in [place] in 2014 for atypical chest pain which revealed normal perfusion with an ejection fraction of 61%. There was no evidence for ischemia and he had normal wall motion. He was hypertensive throughout the study. Echocardiogram January 2017 showed normal LV function, grade 2 diastolic dysfunction, mildly reduced at RV function. Last seen by [name] 01/02/2016.   Subjective:   No acute events overnight. Pt denies any chest pain, SOB, palpitations, dizziness.   He had a repeat echo done which showed normal EF, G2DD, and no wall motion abnormalities  He denies any reflux symptoms. He feels a lot better.    Objective:  Temp:  [97.7 F (36.5 C)-98.5 F (36.9 C)] 97.8 F (36.6 C) (05/22 0329) Pulse Rate:  [59-64] 59 (05/22 0329) Resp:  [16-20] 16 (05/22 0329) BP: (143-164)/(74-91) 160/86 (05/22 0805) SpO2:  [96 %-100 %] 98 % (05/22 0329) Weight:  [242 lb 4.8 oz (109.9 kg)] 242 lb 4.8 oz (109.9 kg) (05/22 0331) Weight change: -6 lb (-2.722 kg)  Intake/Output from previous day: 05/21 0701 - 05/22 0700 In: 675.4 [P.O.:410; I.V.:265.4] Out: 1450 [Urine:1450]  Intake/Output from this shift: Total I/O In: 15 [I.V.:15] Out: -   Medications: Current Facility-Administered Medications  Medication Dose Route Frequency Provider Last Rate Last Dose  . acetaminophen (TYLENOL) tablet 650 mg  650 mg Oral Q4H PRN Bhagat, Bhavinkumar, PA   650 mg at 01/02/17 0357  . aspirin EC tablet 81 mg  81 mg Oral Daily Bhagat, Bhavinkumar, PA   81 mg at 01/02/17 0806  . carvedilol (COREG) tablet 25 mg  25 mg Oral BID Bhagat, Bhavinkumar, PA   25 mg at 01/02/17 0806  . cholecalciferol (VITAMIN D) tablet 1,000 Units  1,000 Units Oral Daily Croitoru, Mihai, MD   1,000 Units at 01/02/17 0806  .  diltiazem (TIAZAC) 24 hr capsule 300 mg  300 mg Oral QHS Bhagat, Bhavinkumar, PA   300 mg at 01/01/17 2147  . famotidine (PEPCID) tablet 20 mg  20 mg Oral Daily Bhagat, Bhavinkumar, PA   20 mg at 01/02/17 0806  . fluticasone (FLONASE) 50 MCG/ACT nasal spray 1 spray  1 spray Each Nare Daily PRN Bhagat, Bhavinkumar, PA      . heparin ADULT infusion 100 units/mL (25000 units/260m sodium chloride 0.45%)  1,500 Units/hr Intravenous Continuous BLaren Everts RPH 15 mL/hr at 01/02/17 0800 1,500 Units/hr at 01/02/17 0800  . insulin aspart (novoLOG) injection 0-15 Units  0-15 Units Subcutaneous TID WC Bhagat, Bhavinkumar, PA   3 Units at 01/02/17 0807  . losartan (COZAAR) tablet 100 mg  100 mg Oral Daily Bhagat, Bhavinkumar, PA   100 mg at 01/02/17 0806  . magnesium oxide (MAG-OX) tablet 400 mg  400 mg Oral Daily Croitoru, Mihai, MD   400 mg at 01/02/17 0806  . multivitamin with minerals tablet 2 tablet  2 tablet Oral Daily Bhagat, Bhavinkumar, PA   2 tablet at 01/02/17 0806  . nitroGLYCERIN (NITROSTAT) SL tablet 0.4 mg  0.4 mg Sublingual Q5 Min x 3 PRN Bhagat, Bhavinkumar, PA      . ondansetron (ZOFRAN) injection 4 mg  4 mg Intravenous Q6H PRN BLeanor Kail PA        Physical Exam:  General: Vital signs reviewed. Patient in no acute distress Cardiovascular: regular rate, rhythm, no  murmur appreciated  Pulmonary/Chest: Clear to auscultation bilaterally, no wheezes, rales, or rhonchi. Abdominal: Soft, non-tender, non-distended, BS + Extremities: No lower extremity edema bilaterally, pulses symmetric and intact bilaterally. Skin: Warm, dry and intact. No rashes or erythema.    Lab Results: Results for orders placed or performed during the hospital encounter of 12/31/16 (from the past 48 hour(s))  Glucose, capillary     Status: None   Collection Time: 12/31/16  4:09 PM  Result Value Ref Range   Glucose-Capillary 70 65 - 99 mg/dL  CBC     Status: Abnormal   Collection Time: 12/31/16  5:47  PM  Result Value Ref Range   WBC 4.8 4.0 - 10.5 K/uL   RBC 4.01 (L) 4.22 - 5.81 MIL/uL   Hemoglobin 11.8 (L) 13.0 - 17.0 g/dL   HCT 37.5 (L) 39.0 - 52.0 %   MCV 93.5 78.0 - 100.0 fL   MCH 29.4 26.0 - 34.0 pg   MCHC 31.5 30.0 - 36.0 g/dL   RDW 13.7 11.5 - 15.5 %   Platelets 251 150 - 400 K/uL  Creatinine, serum     Status: None   Collection Time: 12/31/16  5:47 PM  Result Value Ref Range   Creatinine, Ser 0.92 0.61 - 1.24 mg/dL   GFR calc non Af Amer >60 >60 mL/min   GFR calc Af Amer >60 >60 mL/min    Comment: (NOTE) The eGFR has been calculated using the CKD EPI equation. This calculation has not been validated in all clinical situations. eGFR's persistently <60 mL/min signify possible Chronic Kidney Disease.   Troponin I (q 6hr x 3)     Status: Abnormal   Collection Time: 12/31/16  5:47 PM  Result Value Ref Range   Troponin I 1.03 (HH) <0.03 ng/mL    Comment: CRITICAL RESULT CALLED TO, READ BACK BY AND VERIFIED WITH: RN Morton Stall AT 1939 [phone] MARTINB   Glucose, capillary     Status: Abnormal   Collection Time: 12/31/16  8:28 PM  Result Value Ref Range   Glucose-Capillary 140 (H) 65 - 99 mg/dL  Basic metabolic panel     Status: Abnormal   Collection Time: 01/01/17 12:42 AM  Result Value Ref Range   Sodium 136 135 - 145 mmol/L   Potassium 3.6 3.5 - 5.1 mmol/L   Chloride 102 101 - 111 mmol/L   CO2 27 22 - 32 mmol/L   Glucose, Bld 174 (H) 65 - 99 mg/dL   BUN 11 6 - 20 mg/dL   Creatinine, Ser 0.90 0.61 - 1.24 mg/dL   Calcium 9.1 8.9 - 10.3 mg/dL   GFR calc non Af Amer >60 >60 mL/min   GFR calc Af Amer >60 >60 mL/min    Comment: (NOTE) The eGFR has been calculated using the CKD EPI equation. This calculation has not been validated in all clinical situations. eGFR's persistently <60 mL/min signify possible Chronic Kidney Disease.    Anion gap 7 5 - 15  CBC     Status: Abnormal   Collection Time: 01/01/17 12:42 AM  Result Value Ref Range   WBC 4.4 4.0 - 10.5 K/uL    RBC 3.85 (L) 4.22 - 5.81 MIL/uL   Hemoglobin 11.5 (L) 13.0 - 17.0 g/dL   HCT 35.7 (L) 39.0 - 52.0 %   MCV 92.7 78.0 - 100.0 fL   MCH 29.9 26.0 - 34.0 pg   MCHC 32.2 30.0 - 36.0 g/dL   RDW 13.8 11.5 - 15.5 %  Platelets 231 150 - 400 K/uL  Troponin I (q 6hr x 3)     Status: Abnormal   Collection Time: 01/01/17 12:42 AM  Result Value Ref Range   Troponin I 0.85 (HH) <0.03 ng/mL    Comment: CRITICAL VALUE NOTED.  VALUE IS CONSISTENT WITH PREVIOUSLY REPORTED AND CALLED VALUE.  Lipid panel     Status: Abnormal   Collection Time: 01/01/17 12:42 AM  Result Value Ref Range   Cholesterol 116 0 - 200 mg/dL   Triglycerides 413 (H) <150 mg/dL   HDL 26 (L) >40 mg/dL   Total CHOL/HDL Ratio 4.5 RATIO   VLDL UNABLE TO CALCULATE IF TRIGLYCERIDE OVER 400 mg/dL 0 - 40 mg/dL   LDL Cholesterol UNABLE TO CALCULATE IF TRIGLYCERIDE OVER 400 mg/dL 0 - 99 mg/dL    Comment:        Total Cholesterol/HDL:CHD Risk Coronary Heart Disease Risk Table                     Men   Women  1/2 Average Risk   3.4   3.3  Average Risk       5.0   4.4  2 X Average Risk   9.6   7.1  3 X Average Risk  23.4   11.0        Use the calculated Patient Ratio above and the CHD Risk Table to determine the patient's CHD Risk.        ATP III CLASSIFICATION (LDL):  <100     mg/dL   Optimal  100-129  mg/dL   Near or Above                    Optimal  130-159  mg/dL   Borderline  160-189  mg/dL   High  >190     mg/dL   Very High   Glucose, capillary     Status: Abnormal   Collection Time: 01/01/17  7:19 AM  Result Value Ref Range   Glucose-Capillary 140 (H) 65 - 99 mg/dL  Troponin I (q 6hr x 3)     Status: Abnormal   Collection Time: 01/01/17  7:52 AM  Result Value Ref Range   Troponin I 0.53 (HH) <0.03 ng/mL    Comment: CRITICAL VALUE NOTED.  VALUE IS CONSISTENT WITH PREVIOUSLY REPORTED AND CALLED VALUE.  Glucose, capillary     Status: Abnormal   Collection Time: 01/01/17 11:53 AM  Result Value Ref Range    Glucose-Capillary 144 (H) 65 - 99 mg/dL  Glucose, capillary     Status: Abnormal   Collection Time: 01/01/17  1:27 PM  Result Value Ref Range   Glucose-Capillary 165 (H) 65 - 99 mg/dL  Glucose, capillary     Status: Abnormal   Collection Time: 01/01/17  4:15 PM  Result Value Ref Range   Glucose-Capillary 116 (H) 65 - 99 mg/dL  Heparin level (unfractionated)     Status: Abnormal   Collection Time: 01/01/17  6:18 PM  Result Value Ref Range   Heparin Unfractionated 0.27 (L) 0.30 - 0.70 IU/mL    Comment:        IF HEPARIN RESULTS ARE BELOW EXPECTED VALUES, AND PATIENT DOSAGE HAS BEEN CONFIRMED, SUGGEST FOLLOW UP TESTING OF ANTITHROMBIN III LEVELS.   Glucose, capillary     Status: Abnormal   Collection Time: 01/01/17  7:53 PM  Result Value Ref Range   Glucose-Capillary 148 (H) 65 - 99 mg/dL  Heparin level (unfractionated)  Status: None   Collection Time: 01/02/17  1:39 AM  Result Value Ref Range   Heparin Unfractionated 0.30 0.30 - 0.70 IU/mL    Comment:        IF HEPARIN RESULTS ARE BELOW EXPECTED VALUES, AND PATIENT DOSAGE HAS BEEN CONFIRMED, SUGGEST FOLLOW UP TESTING OF ANTITHROMBIN III LEVELS.   CBC     Status: Abnormal   Collection Time: 01/02/17  4:48 AM  Result Value Ref Range   WBC 4.4 4.0 - 10.5 K/uL   RBC 4.01 (L) 4.22 - 5.81 MIL/uL   Hemoglobin 12.0 (L) 13.0 - 17.0 g/dL   HCT 37.2 (L) 39.0 - 52.0 %   MCV 92.8 78.0 - 100.0 fL   MCH 29.9 26.0 - 34.0 pg   MCHC 32.3 30.0 - 36.0 g/dL   RDW 13.8 11.5 - 15.5 %   Platelets 267 150 - 400 K/uL  Glucose, capillary     Status: Abnormal   Collection Time: 01/02/17  7:41 AM  Result Value Ref Range   Glucose-Capillary 160 (H) 65 - 99 mg/dL    Assessment:  Plan:  Chest pain with typical and atypical features presenting as possible unstable angina: His symptoms had improved after GI cocktail, morphine and nitro patch. Repeat EKG shows NSR with 1st AV block, and nonspecific T wave changes.  Lipid panel shows  triglyceridesof 413, and low HDL. He has been chest pain free.  Trops 1.03--> 0.85--> 0.53.  Echo Jan 2017 shows EF 60-65% and normal wall motion without wall motion abnormalities and G2DD. Pt had a repeat echo which showed normal EF, G2DD, and no wall motion abnormality.   Per further review of outside hospital records, his troponin actually went up from 0.01 and peaked at 1. We are not able to ascertain the rise in troponin to any other etiology and so we will proceed with cardiac cath. We explained the risks and benefits to the patient and the pt wishes to proceed.   -cath today -currently on heparin gtt -currently NPO   HTN: Pt is normotensive  - continue coreg, diltiazem, losartan   HLD:  -Pt will need to be on a statin.  -With his high TG, he may also need further workup outpatient to look for familial causes of hyperTG. TSh in Jan 2017 was within normal range.     Length of Stay:  LOS: 2 days    Burgess Estelle MD MPH PGY2 Internal Medicine Teaching Service  01/02/2017, 9:18 AM

## 2017-01-02 NOTE — Progress Notes (Signed)
ANTICOAGULATION CONSULT NOTE - Follow Up Consult  Pharmacy Consult for heparin Indication: chest pain/ACS  Labs:  Recent Labs  12/31/16 1747 01/01/17 0042 01/01/17 0752 01/01/17 1818 01/02/17 0139  HGB 11.8* 11.5*  --   --   --   HCT 37.5* 35.7*  --   --   --   PLT 251 231  --   --   --   HEPARINUNFRC  --   --   --  0.27* 0.30  CREATININE 0.92 0.90  --   --   --   TROPONINI 1.03* 0.85* 0.53*  --   --     Assessment: 71yo male now therapeutic on heparin after rate increase though at very low end of goal.  Goal of Therapy:  Heparin level 0.3-0.7 units/ml   Plan:  Will increase heparin gtt slightly to 1500 units/hr and check level in 6hr.  Wynona Neat, PharmD, BCPS  01/02/2017,2:35 AM

## 2017-01-02 NOTE — H&P (View-Only) (Signed)
William Orr is a 71 y.o. male with a history of hypertension, hyperlipidemia, diabetes and sleep apnea on CPAP who transferred from [facility] with chest pain. He underwent an exercise nuclear study in [place] in 2014 for atypical chest pain which revealed normal perfusion with an ejection fraction of 61%. There was no evidence for ischemia and he had normal wall motion. He was hypertensive throughout the study. Echocardiogram January 2017 showed normal LV function, grade 2 diastolic dysfunction, mildly reduced at RV function. Last seen by [name] 01/02/2016.   Subjective:   No acute events overnight. Pt denies any chest pain, SOB, palpitations, dizziness.   He had a repeat echo done which showed normal EF, G2DD, and no wall motion abnormalities  He denies any reflux symptoms. He feels a lot better.    Objective:  Temp:  [97.7 F (36.5 C)-98.5 F (36.9 C)] 97.8 F (36.6 C) (05/22 0329) Pulse Rate:  [59-64] 59 (05/22 0329) Resp:  [16-20] 16 (05/22 0329) BP: (143-164)/(74-91) 160/86 (05/22 0805) SpO2:  [96 %-100 %] 98 % (05/22 0329) Weight:  [242 lb 4.8 oz (109.9 kg)] 242 lb 4.8 oz (109.9 kg) (05/22 0331) Weight change: -6 lb (-2.722 kg)  Intake/Output from previous day: 05/21 0701 - 05/22 0700 In: 675.4 [P.O.:410; I.V.:265.4] Out: 1450 [Urine:1450]  Intake/Output from this shift: Total I/O In: 15 [I.V.:15] Out: -   Medications: Current Facility-Administered Medications  Medication Dose Route Frequency Provider Last Rate Last Dose  . acetaminophen (TYLENOL) tablet 650 mg  650 mg Oral Q4H PRN Bhagat, Bhavinkumar, PA   650 mg at 01/02/17 0357  . aspirin EC tablet 81 mg  81 mg Oral Daily Bhagat, Bhavinkumar, PA   81 mg at 01/02/17 0806  . carvedilol (COREG) tablet 25 mg  25 mg Oral BID Bhagat, Bhavinkumar, PA   25 mg at 01/02/17 0806  . cholecalciferol (VITAMIN D) tablet 1,000 Units  1,000 Units Oral Daily Croitoru, Mihai, MD   1,000 Units at 01/02/17 0806  .  diltiazem (TIAZAC) 24 hr capsule 300 mg  300 mg Oral QHS Bhagat, Bhavinkumar, PA   300 mg at 01/01/17 2147  . famotidine (PEPCID) tablet 20 mg  20 mg Oral Daily Bhagat, Bhavinkumar, PA   20 mg at 01/02/17 0806  . fluticasone (FLONASE) 50 MCG/ACT nasal spray 1 spray  1 spray Each Nare Daily PRN Bhagat, Bhavinkumar, PA      . heparin ADULT infusion 100 units/mL (25000 units/260m sodium chloride 0.45%)  1,500 Units/hr Intravenous Continuous BLaren Everts RPH 15 mL/hr at 01/02/17 0800 1,500 Units/hr at 01/02/17 0800  . insulin aspart (novoLOG) injection 0-15 Units  0-15 Units Subcutaneous TID WC Bhagat, Bhavinkumar, PA   3 Units at 01/02/17 0807  . losartan (COZAAR) tablet 100 mg  100 mg Oral Daily Bhagat, Bhavinkumar, PA   100 mg at 01/02/17 0806  . magnesium oxide (MAG-OX) tablet 400 mg  400 mg Oral Daily Croitoru, Mihai, MD   400 mg at 01/02/17 0806  . multivitamin with minerals tablet 2 tablet  2 tablet Oral Daily Bhagat, Bhavinkumar, PA   2 tablet at 01/02/17 0806  . nitroGLYCERIN (NITROSTAT) SL tablet 0.4 mg  0.4 mg Sublingual Q5 Min x 3 PRN Bhagat, Bhavinkumar, PA      . ondansetron (ZOFRAN) injection 4 mg  4 mg Intravenous Q6H PRN BLeanor Kail PA        Physical Exam:  General: Vital signs reviewed. Patient in no acute distress Cardiovascular: regular rate, rhythm, no  murmur appreciated  Pulmonary/Chest: Clear to auscultation bilaterally, no wheezes, rales, or rhonchi. Abdominal: Soft, non-tender, non-distended, BS + Extremities: No lower extremity edema bilaterally, pulses symmetric and intact bilaterally. Skin: Warm, dry and intact. No rashes or erythema.    Lab Results: Results for orders placed or performed during the hospital encounter of 12/31/16 (from the past 48 hour(s))  Glucose, capillary     Status: None   Collection Time: 12/31/16  4:09 PM  Result Value Ref Range   Glucose-Capillary 70 65 - 99 mg/dL  CBC     Status: Abnormal   Collection Time: 12/31/16  5:47  PM  Result Value Ref Range   WBC 4.8 4.0 - 10.5 K/uL   RBC 4.01 (L) 4.22 - 5.81 MIL/uL   Hemoglobin 11.8 (L) 13.0 - 17.0 g/dL   HCT 37.5 (L) 39.0 - 52.0 %   MCV 93.5 78.0 - 100.0 fL   MCH 29.4 26.0 - 34.0 pg   MCHC 31.5 30.0 - 36.0 g/dL   RDW 13.7 11.5 - 15.5 %   Platelets 251 150 - 400 K/uL  Creatinine, serum     Status: None   Collection Time: 12/31/16  5:47 PM  Result Value Ref Range   Creatinine, Ser 0.92 0.61 - 1.24 mg/dL   GFR calc non Af Amer >60 >60 mL/min   GFR calc Af Amer >60 >60 mL/min    Comment: (NOTE) The eGFR has been calculated using the CKD EPI equation. This calculation has not been validated in all clinical situations. eGFR's persistently <60 mL/min signify possible Chronic Kidney Disease.   Troponin I (q 6hr x 3)     Status: Abnormal   Collection Time: 12/31/16  5:47 PM  Result Value Ref Range   Troponin I 1.03 (HH) <0.03 ng/mL    Comment: CRITICAL RESULT CALLED TO, READ BACK BY AND VERIFIED WITH: RN Morton Stall AT 1939 [phone] MARTINB   Glucose, capillary     Status: Abnormal   Collection Time: 12/31/16  8:28 PM  Result Value Ref Range   Glucose-Capillary 140 (H) 65 - 99 mg/dL  Basic metabolic panel     Status: Abnormal   Collection Time: 01/01/17 12:42 AM  Result Value Ref Range   Sodium 136 135 - 145 mmol/L   Potassium 3.6 3.5 - 5.1 mmol/L   Chloride 102 101 - 111 mmol/L   CO2 27 22 - 32 mmol/L   Glucose, Bld 174 (H) 65 - 99 mg/dL   BUN 11 6 - 20 mg/dL   Creatinine, Ser 0.90 0.61 - 1.24 mg/dL   Calcium 9.1 8.9 - 10.3 mg/dL   GFR calc non Af Amer >60 >60 mL/min   GFR calc Af Amer >60 >60 mL/min    Comment: (NOTE) The eGFR has been calculated using the CKD EPI equation. This calculation has not been validated in all clinical situations. eGFR's persistently <60 mL/min signify possible Chronic Kidney Disease.    Anion gap 7 5 - 15  CBC     Status: Abnormal   Collection Time: 01/01/17 12:42 AM  Result Value Ref Range   WBC 4.4 4.0 - 10.5 K/uL    RBC 3.85 (L) 4.22 - 5.81 MIL/uL   Hemoglobin 11.5 (L) 13.0 - 17.0 g/dL   HCT 35.7 (L) 39.0 - 52.0 %   MCV 92.7 78.0 - 100.0 fL   MCH 29.9 26.0 - 34.0 pg   MCHC 32.2 30.0 - 36.0 g/dL   RDW 13.8 11.5 - 15.5 %  Platelets 231 150 - 400 K/uL  Troponin I (q 6hr x 3)     Status: Abnormal   Collection Time: 01/01/17 12:42 AM  Result Value Ref Range   Troponin I 0.85 (HH) <0.03 ng/mL    Comment: CRITICAL VALUE NOTED.  VALUE IS CONSISTENT WITH PREVIOUSLY REPORTED AND CALLED VALUE.  Lipid panel     Status: Abnormal   Collection Time: 01/01/17 12:42 AM  Result Value Ref Range   Cholesterol 116 0 - 200 mg/dL   Triglycerides 413 (H) <150 mg/dL   HDL 26 (L) >40 mg/dL   Total CHOL/HDL Ratio 4.5 RATIO   VLDL UNABLE TO CALCULATE IF TRIGLYCERIDE OVER 400 mg/dL 0 - 40 mg/dL   LDL Cholesterol UNABLE TO CALCULATE IF TRIGLYCERIDE OVER 400 mg/dL 0 - 99 mg/dL    Comment:        Total Cholesterol/HDL:CHD Risk Coronary Heart Disease Risk Table                     Men   Women  1/2 Average Risk   3.4   3.3  Average Risk       5.0   4.4  2 X Average Risk   9.6   7.1  3 X Average Risk  23.4   11.0        Use the calculated Patient Ratio above and the CHD Risk Table to determine the patient's CHD Risk.        ATP III CLASSIFICATION (LDL):  <100     mg/dL   Optimal  100-129  mg/dL   Near or Above                    Optimal  130-159  mg/dL   Borderline  160-189  mg/dL   High  >190     mg/dL   Very High   Glucose, capillary     Status: Abnormal   Collection Time: 01/01/17  7:19 AM  Result Value Ref Range   Glucose-Capillary 140 (H) 65 - 99 mg/dL  Troponin I (q 6hr x 3)     Status: Abnormal   Collection Time: 01/01/17  7:52 AM  Result Value Ref Range   Troponin I 0.53 (HH) <0.03 ng/mL    Comment: CRITICAL VALUE NOTED.  VALUE IS CONSISTENT WITH PREVIOUSLY REPORTED AND CALLED VALUE.  Glucose, capillary     Status: Abnormal   Collection Time: 01/01/17 11:53 AM  Result Value Ref Range    Glucose-Capillary 144 (H) 65 - 99 mg/dL  Glucose, capillary     Status: Abnormal   Collection Time: 01/01/17  1:27 PM  Result Value Ref Range   Glucose-Capillary 165 (H) 65 - 99 mg/dL  Glucose, capillary     Status: Abnormal   Collection Time: 01/01/17  4:15 PM  Result Value Ref Range   Glucose-Capillary 116 (H) 65 - 99 mg/dL  Heparin level (unfractionated)     Status: Abnormal   Collection Time: 01/01/17  6:18 PM  Result Value Ref Range   Heparin Unfractionated 0.27 (L) 0.30 - 0.70 IU/mL    Comment:        IF HEPARIN RESULTS ARE BELOW EXPECTED VALUES, AND PATIENT DOSAGE HAS BEEN CONFIRMED, SUGGEST FOLLOW UP TESTING OF ANTITHROMBIN III LEVELS.   Glucose, capillary     Status: Abnormal   Collection Time: 01/01/17  7:53 PM  Result Value Ref Range   Glucose-Capillary 148 (H) 65 - 99 mg/dL  Heparin level (unfractionated)  Status: None   Collection Time: 01/02/17  1:39 AM  Result Value Ref Range   Heparin Unfractionated 0.30 0.30 - 0.70 IU/mL    Comment:        IF HEPARIN RESULTS ARE BELOW EXPECTED VALUES, AND PATIENT DOSAGE HAS BEEN CONFIRMED, SUGGEST FOLLOW UP TESTING OF ANTITHROMBIN III LEVELS.   CBC     Status: Abnormal   Collection Time: 01/02/17  4:48 AM  Result Value Ref Range   WBC 4.4 4.0 - 10.5 K/uL   RBC 4.01 (L) 4.22 - 5.81 MIL/uL   Hemoglobin 12.0 (L) 13.0 - 17.0 g/dL   HCT 37.2 (L) 39.0 - 52.0 %   MCV 92.8 78.0 - 100.0 fL   MCH 29.9 26.0 - 34.0 pg   MCHC 32.3 30.0 - 36.0 g/dL   RDW 13.8 11.5 - 15.5 %   Platelets 267 150 - 400 K/uL  Glucose, capillary     Status: Abnormal   Collection Time: 01/02/17  7:41 AM  Result Value Ref Range   Glucose-Capillary 160 (H) 65 - 99 mg/dL    Assessment:  Plan:  Chest pain with typical and atypical features presenting as possible unstable angina: His symptoms had improved after GI cocktail, morphine and nitro patch. Repeat EKG shows NSR with 1st AV block, and nonspecific T wave changes.  Lipid panel shows  triglyceridesof 413, and low HDL. He has been chest pain free.  Trops 1.03--> 0.85--> 0.53.  Echo Jan 2017 shows EF 60-65% and normal wall motion without wall motion abnormalities and G2DD. Pt had a repeat echo which showed normal EF, G2DD, and no wall motion abnormality.   Per further review of outside hospital records, his troponin actually went up from 0.01 and peaked at 1. We are not able to ascertain the rise in troponin to any other etiology and so we will proceed with cardiac cath. We explained the risks and benefits to the patient and the pt wishes to proceed.   -cath today -currently on heparin gtt -currently NPO   HTN: Pt is normotensive  - continue coreg, diltiazem, losartan   HLD:  -Pt will need to be on a statin.  -With his high TG, he may also need further workup outpatient to look for familial causes of hyperTG. TSh in Jan 2017 was within normal range.     Length of Stay:  LOS: 2 days    Burgess Estelle MD MPH PGY2 Internal Medicine Teaching Service  01/02/2017, 9:18 AM

## 2017-01-02 NOTE — Research (Signed)
OPTIMIZE Informed Consent   Subject Name: William Orr  Subject met inclusion and exclusion criteria.  The informed consent form, study requirements and expectations were reviewed with the subject and questions and concerns were addressed prior to the signing of the consent form.  The subject verbalized understanding of the trail requirements.  The subject agreed to participate in the OPTIMIZE trial and signed the informed consent.  The informed consent was obtained prior to performance of any protocol-specific procedures for the subject.  A copy of the signed informed consent was given to the subject and a copy was placed in the subject's medical record. Applicable if randomized.   Philemon Kingdom D 01/02/2017, 0235 PM

## 2017-01-03 ENCOUNTER — Encounter (HOSPITAL_COMMUNITY): Payer: Self-pay | Admitting: Interventional Cardiology

## 2017-01-03 ENCOUNTER — Other Ambulatory Visit (HOSPITAL_COMMUNITY): Payer: Self-pay | Admitting: Respiratory Therapy

## 2017-01-03 ENCOUNTER — Inpatient Hospital Stay (HOSPITAL_COMMUNITY): Payer: Medicare Other

## 2017-01-03 ENCOUNTER — Other Ambulatory Visit: Payer: Self-pay | Admitting: *Deleted

## 2017-01-03 ENCOUNTER — Encounter (HOSPITAL_COMMUNITY)
Admission: AD | Disposition: A | Discharge status: Home / Self Care | Payer: Self-pay | Source: Other Acute Inpatient Hospital | Attending: Thoracic Surgery (Cardiothoracic Vascular Surgery)

## 2017-01-03 DIAGNOSIS — I2510 Atherosclerotic heart disease of native coronary artery without angina pectoris: Secondary | ICD-10-CM

## 2017-01-03 DIAGNOSIS — I25110 Atherosclerotic heart disease of native coronary artery with unstable angina pectoris: Principal | ICD-10-CM

## 2017-01-03 DIAGNOSIS — I214 Non-ST elevation (NSTEMI) myocardial infarction: Secondary | ICD-10-CM | POA: Diagnosis not present

## 2017-01-03 DIAGNOSIS — Z01810 Encounter for preprocedural cardiovascular examination: Secondary | ICD-10-CM

## 2017-01-03 DIAGNOSIS — E785 Hyperlipidemia, unspecified: Secondary | ICD-10-CM

## 2017-01-03 LAB — PULMONARY FUNCTION TEST
DL/VA % PRED: 83 %
DL/VA: 3.99 ml/min/mmHg/L
DLCO COR % PRED: 69 %
DLCO COR: 25.17 ml/min/mmHg
DLCO UNC % PRED: 65 %
DLCO unc: 23.71 ml/min/mmHg
FEF 25-75 POST: 3.42 L/s
FEF 25-75 Pre: 3.26 L/sec
FEF2575-%CHANGE-POST: 4 %
FEF2575-%PRED-POST: 122 %
FEF2575-%PRED-PRE: 116 %
FEV1-%Change-Post: 0 %
FEV1-%PRED-PRE: 100 %
FEV1-%Pred-Post: 101 %
FEV1-POST: 3.32 L
FEV1-Pre: 3.29 L
FEV1FVC-%CHANGE-POST: 2 %
FEV1FVC-%PRED-PRE: 103 %
FEV6-%Change-Post: 0 %
FEV6-%PRED-PRE: 99 %
FEV6-%Pred-Post: 98 %
FEV6-Post: 4.06 L
FEV6-Pre: 4.09 L
FEV6FVC-%Change-Post: 0 %
FEV6FVC-%Pred-Post: 104 %
FEV6FVC-%Pred-Pre: 103 %
FVC-%Change-Post: -1 %
FVC-%PRED-POST: 94 %
FVC-%PRED-PRE: 95 %
FVC-POST: 4.06 L
FVC-PRE: 4.12 L
POST FEV1/FVC RATIO: 82 %
PRE FEV1/FVC RATIO: 80 %
Post FEV6/FVC ratio: 100 %
Pre FEV6/FVC Ratio: 99 %
RV % pred: 98 %
RV: 2.56 L
TLC % pred: 88 %
TLC: 6.78 L

## 2017-01-03 LAB — BLOOD GAS, ARTERIAL
Acid-Base Excess: 2.4 mmol/L — ABNORMAL HIGH (ref 0.0–2.0)
Bicarbonate: 26.2 mmol/L (ref 20.0–28.0)
Drawn by: 406621
FIO2: 21
O2 SAT: 95.1 %
PATIENT TEMPERATURE: 98.6
PO2 ART: 75.7 mmHg — AB (ref 83.0–108.0)
pCO2 arterial: 39.1 mmHg (ref 32.0–48.0)
pH, Arterial: 7.442 (ref 7.350–7.450)

## 2017-01-03 LAB — GLUCOSE, CAPILLARY
GLUCOSE-CAPILLARY: 155 mg/dL — AB (ref 65–99)
GLUCOSE-CAPILLARY: 135 mg/dL — AB (ref 65–99)
Glucose-Capillary: 141 mg/dL — ABNORMAL HIGH (ref 65–99)
Glucose-Capillary: 196 mg/dL — ABNORMAL HIGH (ref 65–99)

## 2017-01-03 LAB — VAS US DOPPLER PRE CABG
LCCAPDIAS: 22 cm/s
LCCAPSYS: 127 cm/s
LEFT ECA DIAS: -12 cm/s
LEFT VERTEBRAL DIAS: 23 cm/s
LICADDIAS: -34 cm/s
LICAPSYS: -139 cm/s
Left CCA dist dias: -14 cm/s
Left CCA dist sys: -97 cm/s
Left ICA dist sys: -110 cm/s
Left ICA prox dias: -42 cm/s
RIGHT ECA DIAS: -15 cm/s
RIGHT VERTEBRAL DIAS: 16 cm/s
Right CCA prox dias: 21 cm/s
Right CCA prox sys: 94 cm/s
Right cca dist sys: -127 cm/s

## 2017-01-03 LAB — COMPREHENSIVE METABOLIC PANEL
ALK PHOS: 68 U/L (ref 38–126)
ALT: 39 U/L (ref 17–63)
AST: 35 U/L (ref 15–41)
Albumin: 4 g/dL (ref 3.5–5.0)
Anion gap: 11 (ref 5–15)
BILIRUBIN TOTAL: 0.7 mg/dL (ref 0.3–1.2)
BUN: 8 mg/dL (ref 6–20)
CALCIUM: 9.9 mg/dL (ref 8.9–10.3)
CO2: 24 mmol/L (ref 22–32)
CREATININE: 0.95 mg/dL (ref 0.61–1.24)
Chloride: 102 mmol/L (ref 101–111)
Glucose, Bld: 137 mg/dL — ABNORMAL HIGH (ref 65–99)
Potassium: 4.2 mmol/L (ref 3.5–5.1)
Sodium: 137 mmol/L (ref 135–145)
TOTAL PROTEIN: 7.6 g/dL (ref 6.5–8.1)

## 2017-01-03 LAB — BASIC METABOLIC PANEL
Anion gap: 8 (ref 5–15)
BUN: 9 mg/dL (ref 6–20)
CALCIUM: 9.2 mg/dL (ref 8.9–10.3)
CHLORIDE: 103 mmol/L (ref 101–111)
CO2: 25 mmol/L (ref 22–32)
CREATININE: 0.91 mg/dL (ref 0.61–1.24)
GFR calc Af Amer: >60 mL/min (ref >60)
GFR calc non Af Amer: >60 mL/min (ref >60)
GLUCOSE: 137 mg/dL — AB (ref 65–99)
Potassium: 4.3 mmol/L (ref 3.5–5.1)
Sodium: 136 mmol/L (ref 135–145)

## 2017-01-03 LAB — TYPE AND SCREEN
ABO/RH(D): O POS
ANTIBODY SCREEN: NEGATIVE

## 2017-01-03 LAB — CBC
HCT: 39.3 % (ref 39.0–52.0)
Hemoglobin: 12.7 g/dL — ABNORMAL LOW (ref 13.0–17.0)
MCH: 29.7 pg (ref 26.0–34.0)
MCHC: 32.3 g/dL (ref 30.0–36.0)
MCV: 91.8 fL (ref 78.0–100.0)
PLATELETS: 263 10*3/uL (ref 150–400)
RBC: 4.28 MIL/uL (ref 4.22–5.81)
RDW: 13.9 % (ref 11.5–15.5)
WBC: 5.5 10*3/uL (ref 4.0–10.5)

## 2017-01-03 LAB — PROTIME-INR
INR: 1.05
Prothrombin Time: 13.7 seconds (ref 11.4–15.2)

## 2017-01-03 LAB — HEPARIN LEVEL (UNFRACTIONATED): HEPARIN UNFRACTIONATED: 0.25 [IU]/mL — AB (ref 0.30–0.70)

## 2017-01-03 LAB — ABO/RH: ABO/RH(D): O POS

## 2017-01-03 LAB — APTT: aPTT: 70 seconds — ABNORMAL HIGH (ref 24–36)

## 2017-01-03 SURGERY — CORONARY STENT INTERVENTION
Anesthesia: LOCAL

## 2017-01-03 MED ORDER — CHLORHEXIDINE GLUCONATE 0.12 % MT SOLN
15.0000 mL | Freq: Once | OROMUCOSAL | Status: AC
  Administered 2017-01-04: 15 mL via OROMUCOSAL
  Filled 2017-01-03: qty 15

## 2017-01-03 MED ORDER — ALBUTEROL SULFATE (2.5 MG/3ML) 0.083% IN NEBU: 2.5000 mg | INHALATION_SOLUTION | Freq: Once | RESPIRATORY_TRACT | Status: DC

## 2017-01-03 MED ORDER — EPINEPHRINE PF 1 MG/ML IJ SOLN
0.0000 ug/min | INTRAVENOUS | Status: DC
  Filled 2017-01-03 (×2): qty 4

## 2017-01-03 MED ORDER — CHLORHEXIDINE GLUCONATE 4 % EX LIQD
60.0000 mL | Freq: Once | CUTANEOUS | Status: AC
  Administered 2017-01-04: 4 via TOPICAL

## 2017-01-03 MED ORDER — PLASMA-LYTE 148 IV SOLN
INTRAVENOUS | Status: AC
  Administered 2017-01-04: 500 mL
  Filled 2017-01-03 (×2): qty 2.5

## 2017-01-03 MED ORDER — DEXTROSE 5 % IV SOLN
750.0000 mg | INTRAVENOUS | Status: DC
  Filled 2017-01-03 (×2): qty 750

## 2017-01-03 MED ORDER — DEXMEDETOMIDINE HCL IN NACL 400 MCG/100ML IV SOLN
0.1000 ug/kg/h | INTRAVENOUS | Status: AC
  Administered 2017-01-04: .3 ug/kg/h via INTRAVENOUS
  Filled 2017-01-03 (×2): qty 100

## 2017-01-03 MED ORDER — TEMAZEPAM 15 MG PO CAPS
15.0000 mg | ORAL_CAPSULE | Freq: Once | ORAL | Status: DC | PRN
  Filled 2017-01-03: qty 1

## 2017-01-03 MED ORDER — VANCOMYCIN HCL 10 G IV SOLR
1500.0000 mg | INTRAVENOUS | Status: AC
  Administered 2017-01-04: 1500 mg via INTRAVENOUS
  Filled 2017-01-03 (×2): qty 1500

## 2017-01-03 MED ORDER — VANCOMYCIN HCL 1000 MG IV SOLR
INTRAVENOUS | Status: AC
  Administered 2017-01-04: 1000 mL
  Filled 2017-01-03 (×2): qty 1000

## 2017-01-03 MED ORDER — PHENYLEPHRINE HCL 10 MG/ML IJ SOLN
30.0000 ug/min | INTRAMUSCULAR | Status: DC
  Filled 2017-01-03 (×2): qty 2

## 2017-01-03 MED ORDER — ATORVASTATIN CALCIUM 80 MG PO TABS
80.0000 mg | ORAL_TABLET | Freq: Every day | ORAL | Status: DC
  Administered 2017-01-03: 80 mg via ORAL
  Filled 2017-01-03: qty 1

## 2017-01-03 MED ORDER — TRANEXAMIC ACID (OHS) BOLUS VIA INFUSION
15.0000 mg/kg | INTRAVENOUS | Status: AC
  Administered 2017-01-04: 1630.5 mg via INTRAVENOUS
  Filled 2017-01-03 (×2): qty 1631

## 2017-01-03 MED ORDER — CHLORHEXIDINE GLUCONATE 4 % EX LIQD
60.0000 mL | Freq: Once | CUTANEOUS | Status: AC
  Administered 2017-01-03: 4 via TOPICAL
  Filled 2017-01-03: qty 60

## 2017-01-03 MED ORDER — SODIUM CHLORIDE 0.9 % IV SOLN
INTRAVENOUS | Status: AC
  Administered 2017-01-04: 2 [IU]/h via INTRAVENOUS
  Filled 2017-01-03 (×2): qty 1

## 2017-01-03 MED ORDER — DOPAMINE-DEXTROSE 3.2-5 MG/ML-% IV SOLN
0.0000 ug/kg/min | INTRAVENOUS | Status: DC
Start: 2017-01-04 — End: 2017-01-04
  Filled 2017-01-03: qty 250

## 2017-01-03 MED ORDER — VANCOMYCIN HCL 10 G IV SOLR: 1250.0000 mg | INTRAVENOUS | Status: DC

## 2017-01-03 MED ORDER — METOPROLOL TARTRATE 12.5 MG HALF TABLET
12.5000 mg | ORAL_TABLET | Freq: Once | ORAL | Status: AC
  Administered 2017-01-04: 12.5 mg via ORAL
  Filled 2017-01-03: qty 1

## 2017-01-03 MED ORDER — TRANEXAMIC ACID (OHS) PUMP PRIME SOLUTION
2.0000 mg/kg | INTRAVENOUS | Status: DC
  Filled 2017-01-03 (×2): qty 2.17

## 2017-01-03 MED ORDER — POTASSIUM CHLORIDE 2 MEQ/ML IV SOLN
80.0000 meq | INTRAVENOUS | Status: DC
  Filled 2017-01-03 (×2): qty 40

## 2017-01-03 MED ORDER — DEXTROSE 5 % IV SOLN
1.5000 g | INTRAVENOUS | Status: AC
  Administered 2017-01-04: .75 g via INTRAVENOUS
  Administered 2017-01-04: 1.5 g via INTRAVENOUS
  Filled 2017-01-03 (×2): qty 1.5

## 2017-01-03 MED ORDER — TRANEXAMIC ACID 1000 MG/10ML IV SOLN
1.5000 mg/kg/h | INTRAVENOUS | Status: AC
  Administered 2017-01-04: 1.5 mg/kg/h via INTRAVENOUS
  Filled 2017-01-03 (×2): qty 25

## 2017-01-03 MED ORDER — HEPARIN SODIUM (PORCINE) 1000 UNIT/ML IJ SOLN
INTRAMUSCULAR | Status: DC
  Filled 2017-01-03 (×2): qty 30

## 2017-01-03 MED ORDER — BISACODYL 5 MG PO TBEC
5.0000 mg | DELAYED_RELEASE_TABLET | Freq: Once | ORAL | Status: AC
  Administered 2017-01-03: 5 mg via ORAL
  Filled 2017-01-03: qty 1

## 2017-01-03 MED ORDER — NITROGLYCERIN IN D5W 200-5 MCG/ML-% IV SOLN
2.0000 ug/min | INTRAVENOUS | Status: AC
  Administered 2017-01-04: 5 ug/min via INTRAVENOUS
  Filled 2017-01-03: qty 250

## 2017-01-03 MED ORDER — MAGNESIUM SULFATE 50 % IJ SOLN
40.0000 meq | INTRAMUSCULAR | Status: DC
  Filled 2017-01-03 (×2): qty 10

## 2017-01-03 MED FILL — Verapamil HCl IV Soln 2.5 MG/ML: INTRAVENOUS | Qty: 2 | Status: AC

## 2017-01-03 NOTE — Progress Notes (Signed)
Patient states that he wear CPAP at home at night. Furthermore pt was aware and acknowledgeable of his home settings. CPAP was set up per patient home regimen. Pt states that his pressure is set at 10-11cmH2O. NIV machine was set up and education was given to patient on the proper use of the machine. O2 humidity provided. Pt states that he will wear it once he is ready for bed. Pt also states that he wears a full face mask at home.

## 2017-01-03 NOTE — Progress Notes (Signed)
RT attempted to obtain ABG for pre-op but pt was unavailable at this time. Rt will attempt later

## 2017-01-03 NOTE — Progress Notes (Signed)
Pre-op Cardiac Surgery  Carotid Findings:  Right 1-39% ICA stenosis. Left 40-59% ICA stenosis, based on velocity. Antegrade vertebral flow.  Upper Extremity Right Left  Brachial Pressures 164 153  Radial Waveforms Tri Tri  Ulnar Waveforms Tri Tri  Palmar Arch (Allen's Test) Normal  Normal    Findings:  Pedal artery waveforms within normal limits.  Landry Mellow, RDMS, RVT 01/03/2017

## 2017-01-03 NOTE — Progress Notes (Addendum)
Progress Note  Patient Name: William Orr Date of Encounter: 01/03/2017  Primary Cardiologist: [name]  Subjective   No complaints of chest pain or SOB  Inpatient Medications    Scheduled Meds: . aspirin  81 mg Oral Daily  . carvedilol  25 mg Oral BID  . cholecalciferol  1,000 Units Oral Daily  . diltiazem  300 mg Oral QHS  . famotidine  20 mg Oral Daily  . insulin aspart  0-15 Units Subcutaneous TID WC  . losartan  100 mg Oral Daily  . magnesium oxide  400 mg Oral Daily  . multivitamin with minerals  2 tablet Oral Daily  . sodium chloride flush  3 mL Intravenous Q12H   Continuous Infusions: . sodium chloride    . heparin 1,600 Units/hr (01/03/17 0624)   PRN Meds: sodium chloride, acetaminophen, fluticasone, nitroGLYCERIN, ondansetron (ZOFRAN) IV, oxyCODONE-acetaminophen, sodium chloride flush   Vital Signs    Vitals:   01/02/17 1815 01/02/17 2001 01/02/17 2106 01/03/17 0600  BP: (!) 142/75 (!) 142/86 (!) 156/75 134/60  Pulse:  64  (!) 55  Resp:  18    Temp:  98.1 F (36.7 C)  98 F (36.7 C)  TempSrc:  Oral  Oral  SpO2:  98%  98%  Weight:    239 lb 11.2 oz (108.7 kg)  Height:        Intake/Output Summary (Last 24 hours) at 01/03/17 0921 Last data filed at 01/03/17 0829  Gross per 24 hour  Intake           669.78 ml  Output             2100 ml  Net         -1430.22 ml   Filed Weights   12/31/16 1605 01/02/17 0331 01/03/17 0600  Weight: 248 lb 4.8 oz (112.6 kg) 242 lb 4.8 oz (109.9 kg) 239 lb 11.2 oz (108.7 kg)    Telemetry    NSR - Personally Reviewed  ECG    NSR - Personally Reviewed  Physical Exam   GEN: No acute distress.   Neck: No JVD Cardiac: RRR, no murmurs, rubs, or gallops.  Respiratory: Clear to auscultation bilaterally. GI: Soft, nontender, non-distended  MS: No edema; No deformity.  Right radial cath site clean and dry with no hematoma and intact radial pulse Neuro:  Nonfocal  Psych: Normal affect   Labs     Chemistry Recent Labs Lab 12/31/16 1747 01/01/17 0042 01/03/17 0526  NA  --  136 136  K  --  3.6 4.3  CL  --  102 103  CO2  --  27 25  GLUCOSE  --  174* 137*  BUN  --  11 9  CREATININE 0.92 0.90 0.91  CALCIUM  --  9.1 9.2  GFRNONAA >60 >60 >60  GFRAA >60 >60 >60  ANIONGAP  --  7 8     Hematology Recent Labs Lab 01/01/17 0042 01/02/17 0448 01/03/17 0526  WBC 4.4 4.4 5.5  RBC 3.85* 4.01* 4.28  HGB 11.5* 12.0* 12.7*  HCT 35.7* 37.2* 39.3  MCV 92.7 92.8 91.8  MCH 29.9 29.9 29.7  MCHC 32.2 32.3 32.3  RDW 13.8 13.8 13.9  PLT 231 267 263    Cardiac Enzymes Recent Labs Lab 12/31/16 1747 01/01/17 0042 01/01/17 0752  TROPONINI 1.03* 0.85* 0.53*   No results for input(s): TROPIPOC in the last 168 hours.   BNPNo results for input(s): BNP, PROBNP in the last 168  hours.   DDimer No results for input(s): DDIMER in the last 168 hours.   Radiology    No results found.  Cardiac Studies  2D echo 01/01/2017 Study Conclusions  - Left ventricle: The cavity size was normal. There was mild focal   basal hypertrophy of the septum. Systolic function was normal.   The estimated ejection fraction was in the range of 55% to 60%.   Wall motion was normal; there were no regional wall motion   abnormalities. Features are consistent with a pseudonormal left   ventricular filling pattern, with concomitant abnormal relaxation   and increased filling pressure (grade 2 diastolic dysfunction). - Left atrium: The atrium was moderately dilated.    Cardiac Cath 01/02/2017 Conclusion    Unstable angina pectoris/non-ST elevation MI  Multifocal high-grade disease involving the proximal and mid LAD (tandem lesions). The LAD gives origin to 2 large diagonals, the first of which contains ostial to proximal 80-85% stenosis and forms a [place] 011 bifurcation lesion with the proximal LAD. The second larger diagonal contains 50-70% mid stenosis.  Circumflex is widely patent without  significant stenosis.  Right coronary is dominant with a large PDA which contains luminal irregularities but no high-grade obstruction.  Normal left ventricular function. Left ventricular end-diastolic pressure is upper normal.  RECOMMENDATION:   CABG with LIMA to LAD and diagonals versus higher risk multilesion PCI. Have notified [name] (his primary cardiologist) and also asked for TCTS consult.     Patient Profile     71 y.o. male with a history of hypertension, hyperlipidemia, diabetes and sleep apnea on CPAP who transferred from [facility] with chest pain.  His troponin trended up to 0.07 from 0.04 and transferred to [facility] for further evaluation. Trop peaked at 1.  EKG shows sinus bradycardia at rate of 57 bpm. No acute changes. He is currently chest pain-free on Nitropatch.  Assessment & Plan    NSTEMI:  - Trops 0.04>0.07>1.03--> 0.85--> 0.53.   - cath showed multifocal high-grade disease involving the proximal and mid LAD (tandem lesions). The LAD gives origin to 2 large diagonals, the first of which contains ostial to proximal 80-85% stenosis and forms a bifurcation lesion with the proximal LAD. The second larger diagonal contains 50-70% mid stenosis. Circumflex is widely patent without significant stenosis. Right coronary is dominant with a large PDA which contains luminal irregularities but no high-grade obstruction. - CVTS has been consulted to evaluate for LIMA to LAD/Diag and plan per CVTS is CABG in am.  - continue IV Heparin gtt - continue ASA and BB.  - start high dose statin  HTN: Pt is normotensive  - continue Carvedilol 25mg  BID, losartan 100mg  daily and Cardizem 300mg  daily.     HLD:  - start Lipitor 80mg  daily - with his high TG, he may also need further workup outpatient to look for familial causes of hyperTG.  - TSH in Jan 2017 was within normal range.    Signed, Fransico Him, MD  01/03/2017, 9:21 AM

## 2017-01-03 NOTE — Progress Notes (Signed)
ANTICOAGULATION CONSULT NOTE - Follow Up Consult  Pharmacy Consult for Heparin Indication: chest pain/ACS  Allergies  Allergen Reactions  . Lisinopril Cough    Patient Measurements: Height: 6\' 1"  (185.4 cm) Weight: 239 lb 11.2 oz (108.7 kg) IBW/kg (Calculated) : 79.9 Heparin Dosing Weight: 103.6kg  Vital Signs: Temp: 98 F (36.7 C) (05/23 0600) Temp Source: Oral (05/23 0600) BP: 134/60 (05/23 0600) Pulse Rate: 55 (05/23 0600)  Labs:  Recent Labs  12/31/16 1747 01/01/17 0042 01/01/17 0752 01/01/17 1818 01/02/17 0139 01/02/17 0448 01/02/17 1203 01/03/17 0526  HGB 11.8* 11.5*  --   --   --  12.0*  --  12.7*  HCT 37.5* 35.7*  --   --   --  37.2*  --  39.3  PLT 251 231  --   --   --  267  --  263  LABPROT  --   --   --   --   --   --  14.1  --   INR  --   --   --   --   --   --  1.09  --   HEPARINUNFRC  --   --   --  0.27* 0.30  --   --  0.25*  CREATININE 0.92 0.90  --   --   --   --   --  0.91  TROPONINI 1.03* 0.85* 0.53*  --   --   --   --   --     Estimated Creatinine Clearance: 99 mL/min (by C-G formula based on SCr of 0.91 mg/dL).   Assessment: 70yom on IV heparin for ACS prior to cath lab.  Now s/p cath, awaiting CABG consult.  Pharmacy asked to resume IV heparin 8 hrs after sheath removed.  Radial sheath removed at 1615 PM.    Heparin level 0.25 after restarting heparin at 00:10 this morning. No issues with infusion.   Goal of Therapy:  Heparin level 0.3-0.7 units/ml Monitor platelets by anticoagulation protocol: Yes   Plan:  -Increase IV heparin infusion to 1600 units/hr  -Follow up heparin level later today  -F/u plans for CABG  Vincenza Hews, PharmD, BCPS 01/03/2017, 6:30 AM

## 2017-01-03 NOTE — Consult Note (Signed)
[address]       [place] [address]             [phone]          CARDIOTHORACIC SURGERY CONSULTATION REPORT  PCP is [name], MD Referring Provider is Tamala Julian is Daiva Eves, MD Primary Cardiologist is Troy Sine, MD  Reason for consultation:  Severe coronary artery disease with unstable angina  HPI:  Patient is a 71 year old male with no previous history of coronary artery disease but risk factors including history of hypertension, hyperlipidemia, type II diabetes mellitus, obesity, and obstructive sleep apnea on CPAP who has been referred for surgical consultation to discuss treatment options for management of complex single-vessel coronary artery disease with unstable angina pectoris.  Patient has been followed by [name] for several years and underwent a nuclear stress test in 2014 that was felt to be low risk. Echocardiogram in 2017 revealed normal left ventricular function with grade 2 diastolic dysfunction. Approximately 2 weeks ago the patient experienced an episode of substernal chest burning associated with exertion while he was working in the yard. Symptoms subsided. On 12/31/2016 the patient got up from his sleep with similar burning substernal chest pain and tightness that was more severe. Initially he suspected indigestion and he drank some baking soda. Symptoms initially began to subside but became worse, ultimately prompting him to present to the emergency department at [facility]. Various symptoms of chest pain improved following morphine, GI cocktail, and nitroglycerin. Baseline EKG reveals sinus rhythm without acute changes. Troponin levels trended up from 0.04 on arrival to 0.07. The patient was transferred to [facility] and underwent diagnostic cardiac catheterization yesterday by Dr. Tamala Julian. He was found to have complex single-vessel coronary artery disease with long segment diffuse high-grade stenosis of the left  anterior descending coronary artery with coronary anatomy felt to be relatively unfavorable for percutaneous coronary intervention. Cardiothoracic surgical consultation was requested.  Patient is married and lives with his wife in [place], [place].  He works Surveyor, quantity and has remained reasonably active physically all of his adult life. He admits that for the last several years he has not been exercising regularly and he has gained some weight. Although he does not exercise, he denies any specific physical limitations. He still takes care of his yard and I jobs around the house. He denies any previous history of chest pain or chest tightness prior to that described previously. The patient denies any history of exertional shortness of breath, PND, orthopnea, or lower extremity edema. He has a long history of PVCs and palpitations.   Past Medical History:  Diagnosis Date  . Diabetes mellitus without complication ([place])   . History of hiatal hernia   . Hyperlipidemia   . Hypertension     Past Surgical History:  Procedure Laterality Date  . KNEE ARTHROSCOPY Bilateral   . LEFT HEART CATH AND CORONARY ANGIOGRAPHY N/A 01/02/2017   Procedure: Left Heart Cath and Coronary Angiography;  Surgeon: [name], MD;  Location: [facility];  Service: Cardiovascular;  Laterality: N/A;    Family History  Problem Relation Age of Onset  . Diabetes Mother   . Cancer - Prostate Father   . Rheum arthritis Sister   . Diabetes Brother   . Arthritis Brother   . Cancer - Other Brother        pancreatic  . Rheum arthritis Brother   . Diabetes type II  Son     Social History   Social History  . Marital status: Married    Spouse name: N/A  . Number of children: N/A  . Years of education: N/A   Occupational History  . Not on file.   Social History Main Topics  . Smoking status: Former Smoker    Types: Pipe  . Smokeless tobacco: Never Used     Comment: quit smoking pipe 30 years ago.    . Alcohol use No  . Drug use: No  . Sexual activity: Not Currently   Other Topics Concern  . Not on file   Social History Narrative  . No narrative on file    Prior to Admission medications   Medication Sig Start Date End Date Taking? Authorizing Provider  Alpha-Lipoic Acid 600 MG CAPS Take 600 mg by mouth daily.    Yes [provider]  carvedilol (COREG) 25 MG tablet Take 25 mg by mouth 2 (two) times daily.  08/22/13  Yes [provider]  Cholecalciferol (VITAMIN D-3) 5000 UNITS TABS Take 10,000 Units by mouth daily.    Yes [provider]  diclofenac sodium (VOLTAREN) 1 % GEL Apply 1 application topically 4 (four) times daily as needed (pain).   Yes [provider]  diltiazem (TIAZAC) 300 MG 24 hr capsule Take 300 mg by mouth at bedtime.    Yes [provider]  fluticasone (FLONASE) 50 MCG/ACT nasal spray Place 1 spray into both nostrils daily as needed (seasonal allergies).  08/11/13  Yes [provider]  glipiZIDE (GLUCOTROL) 10 MG tablet Take 15 mg by mouth 2 (two) times daily.  02/22/14  Yes [provider]  losartan (COZAAR) 100 MG tablet Take 100 mg by mouth daily.   Yes [provider]  Magnesium 100 MG TABS Take 100 mg by mouth daily.   Yes [provider]  metFORMIN (GLUCOPHAGE) 850 MG tablet Take 850 mg by mouth 2 (two) times daily with a meal.  08/19/13  Yes [provider]  Multiple Vitamin (MULTIVITAMIN WITH MINERALS) TABS tablet Take 2 tablets by mouth daily.   Yes [provider]  naproxen sodium (ALEVE) 220 MG tablet Take 440 mg by mouth daily as needed (pain).   Yes [provider]  OVER THE COUNTER MEDICATION Take 1 tablet by mouth 2 (two) times daily. Super Beta Prostate   Yes [provider]  OVER THE COUNTER MEDICATION Take 2 capsules by mouth daily. Krill Oil with CoQ10   Yes [provider]  ranitidine (ZANTAC) 150 MG tablet Take 150 mg by  mouth daily before breakfast.   Yes [provider]  sodium chloride (OCEAN) 0.65 % SOLN nasal spray Place 1 spray into both nostrils 2 (two) times daily as needed for congestion.   Yes [provider]    Current Facility-Administered Medications  Medication Dose Route Frequency Provider Last Rate Last Dose  . 0.9 %  sodium chloride infusion  250 mL Intravenous PRN [name], MD      . acetaminophen (TYLENOL) tablet 650 mg  650 mg Oral Q4H PRN [name], MD      . albuterol (PROVENTIL) (2.5 MG/3ML) 0.083% nebulizer solution 2.5 mg  2.5 mg Nebulization Once [name], MD      . aspirin chewable tablet 81 mg  81 mg Oral Daily [name], MD   81 mg at 01/03/17 2505  . atorvastatin (LIPITOR) tablet 80 mg  80 mg Oral  [id number] Sueanne Margarita, MD      . carvedilol (COREG) tablet 25 mg  25 mg Oral BID Bhagat, Bhavinkumar, PA   25 mg at 01/03/17 0837  . cholecalciferol (VITAMIN D) tablet 1,000 Units  1,000 Units Oral Daily Croitoru, Mihai, MD   1,000 Units at 01/03/17 [phone]  . diltiazem (TIAZAC) 24 hr capsule 300 mg  300 mg Oral QHS Bhagat, Bhavinkumar, PA   300 mg at 01/02/17 2106  . famotidine (PEPCID) tablet 20 mg  20 mg Oral Daily Bhagat, Bhavinkumar, PA   20 mg at 01/03/17 1517  . fluticasone (FLONASE) 50 MCG/ACT nasal spray 1 spray  1 spray Each Nare Daily PRN Bhagat, Bhavinkumar, PA      . heparin ADULT infusion 100 units/mL (25000 units/232mL sodium chloride 0.45%)  1,600 Units/hr Intravenous Continuous [name], MD 16 mL/hr at 01/03/17 1200 1,600 Units/hr at 01/03/17 1200  . insulin aspart (novoLOG) injection 0-15 Units  0-15 Units Subcutaneous TID WC Bhagat, Bhavinkumar, PA   2 Units at 01/03/17 1200  . losartan (COZAAR) tablet 100 mg  100 mg Oral Daily Bhagat, Bhavinkumar, PA   100 mg at 01/03/17 6160  . magnesium oxide (MAG-OX) tablet 400 mg  400 mg Oral Daily Croitoru, Mihai, MD   400 mg at 01/03/17 7371  . multivitamin with minerals tablet 2  tablet  2 tablet Oral Daily Bhagat, Bhavinkumar, PA   2 tablet at 01/03/17 0837  . nitroGLYCERIN (NITROSTAT) SL tablet 0.4 mg  0.4 mg Sublingual Q5 Min x 3 PRN Bhagat, Bhavinkumar, PA      . ondansetron (ZOFRAN) injection 4 mg  4 mg Intravenous Q6H PRN [name], MD      . oxyCODONE-acetaminophen (PERCOCET/ROXICET) 5-325 MG per tablet 1-2 tablet  1-2 tablet Oral Q4H PRN [name], MD      . sodium chloride flush (NS) 0.9 % injection 3 mL  3 mL Intravenous Q12H [name], MD   3 mL at 01/03/17 0841  . sodium chloride flush (NS) 0.9 % injection 3 mL  3 mL Intravenous PRN [name], MD        Allergies  Allergen Reactions  . Lisinopril Cough      Review of Systems:   General:  normal appetite, decreased energy, + weight gain, NO weight loss, NO fever  Cardiac:  + chest pain with exertion, + chest pain at rest, no SOB with exertion, no resting SOB, no PND, no orthopnea, + palpitations, + arrhythmia, no atrial fibrillation, no LE edema, no dizzy spells, no syncope  Respiratory:  no shortness of breath, no home oxygen, no productive cough, no dry cough, no bronchitis, no wheezing, no hemoptysis, no asthma, no pain with inspiration or cough, + sleep apnea, + CPAP at night  GI:   mild difficulty swallowing, no reflux, no frequent heartburn, + hiatal hernia, no abdominal pain, no constipation, no diarrhea, no hematochezia, no hematemesis, no melena  GU:   no dysuria,  no frequency, no urinary tract infection, no hematuria, no enlarged prostate, no kidney stones, no kidney disease  Vascular:  no pain suggestive of claudication, no pain in feet, no leg cramps, no varicose veins, no DVT, no non-healing foot ulcer  Neuro:   no stroke, no TIA's, no seizures, no headaches, no temporary blindness one eye,  no slurred speech, no peripheral neuropathy, no chronic pain, no instability of gait, no memory/cognitive dysfunction  Musculoskeletal: + arthritis - primarily involving the knees and  lower back, no joint swelling, no myalgias, no difficulty walking, normal mobility   Skin:   no rash, no itching, no skin infections, no pressure sores or ulcerations  Psych:   no anxiety, no depression, no nervousness, no unusual recent stress  Eyes:   no blurry vision, no floaters, no recent vision changes, no wears glasses or contacts  ENT:   no hearing loss, no loose or painful teeth, + partial dentures  Hematologic:  no easy bruising, no abnormal bleeding, no clotting disorder, no frequent epistaxis  Endocrine:  + diabetes, does not check CBG's at home     Physical Exam:   BP 131/71 (BP Location: Left Arm)   Pulse (!) 59   Temp 97.6 F (36.4 C) (Oral)   Resp 20   Ht 6\' 1"  (1.854 m)   Wt 239 lb 11.2 oz (108.7 kg)   SpO2 97%   BMI 31.62 kg/m   General:  Mildly obese,  well-appearing  HEENT:  Unremarkable   Neck:   no JVD, no bruits, no adenopathy   Chest:   clear to auscultation, symmetrical breath sounds, no wheezes, no rhonchi   CV:   RRR, no murmur   Abdomen:  soft, non-tender, no masses   Extremities:  warm, well-perfused, pulses palpable, no lower extremity edema  Rectal/GU  Deferred  Neuro:   Grossly non-focal and symmetrical throughout  Skin:   Clean and dry, no rashes, no breakdown  Diagnostic Tests:  Transthoracic Echocardiography  Patient:    William Orr, William Orr MR #:       [id number] Study Date: 01/01/2017 Gender:     M Age:        69 Height:     182.9 cm Weight:     112.7 kg BSA:        2.43 m^2 Pt. Status: Room:       [place] Croitoru, MD  PERFORMING   [facility], Inpatient  SONOGRAPHER  Darlina Sicilian, RDCS  cc:  ------------------------------------------------------------------- LV EF: 55% -   60%  ------------------------------------------------------------------- Indications:      Chest pain 786.51.  ------------------------------------------------------------------- History:   PMH:  Sleep Apnea.  Risk factors:   Hypertension. Diabetes mellitus. Dyslipidemia.  ------------------------------------------------------------------- Study Conclusions  - Left ventricle: The cavity size was normal. There was mild focal   basal hypertrophy of the septum. Systolic function was normal.   The estimated ejection fraction was in the range of 55% to 60%.   Wall motion was normal; there were no regional wall motion   abnormalities. Features are consistent with a pseudonormal left   ventricular filling pattern, with concomitant abnormal relaxation   and increased filling pressure (grade 2 diastolic dysfunction). - Left atrium: The atrium was moderately dilated.  ------------------------------------------------------------------- Study data:  Comparison was made to the study of 08/27/2015.  Study status:  Routine.  Procedure:  The patient reported no pain pre or post test. Transthoracic echocardiography. Image quality was adequate.  Study completion:  There were no complications. Transthoracic echocardiography.  M-mode, complete 2D, spectral Doppler, and color Doppler.  Birthdate:  Patient birthdate: Jan 17, 1946.  Age:  Patient is 71 yr old.  Sex:  Gender: male. BMI: 33.7 kg/m^2.  Blood pressure:     138/66  Patient status: Inpatient.  Study date:  Study date: 01/01/2017. Study time: 12:05 PM.  Location:  [facility] laboratory.  -------------------------------------------------------------------  ------------------------------------------------------------------- Left ventricle:  The cavity size was normal. There was mild focal basal hypertrophy of the  septum. Systolic function was normal. The estimated ejection fraction was in the range of 55% to 60%. Wall motion was normal; there were no regional wall motion abnormalities. Features are consistent with a pseudonormal left ventricular filling pattern, with concomitant abnormal relaxation and increased filling pressure (grade 2 diastolic  dysfunction).  ------------------------------------------------------------------- Aortic valve:   Trileaflet; mildly thickened, mildly calcified leaflets. Sclerosis without stenosis.  Doppler:  There was no significant regurgitation.  ------------------------------------------------------------------- Aorta:  Aortic root: The aortic root was normal in size. Ascending aorta: The ascending aorta was normal in size.  ------------------------------------------------------------------- Mitral valve:   Structurally normal valve.   Leaflet separation was normal.  Doppler:  Transvalvular velocity was within the normal range. There was no evidence for stenosis. There was no regurgitation.    Peak gradient (D): 2 mm Hg.  ------------------------------------------------------------------- Left atrium:  The atrium was moderately dilated.  ------------------------------------------------------------------- Right ventricle:  The cavity size was normal. Systolic function was normal.  ------------------------------------------------------------------- Pulmonic valve:   Poorly visualized.  The valve appears to be grossly normal.    Doppler:  There was trivial regurgitation.  ------------------------------------------------------------------- Tricuspid valve:   Structurally normal valve.   Leaflet separation was normal.  Doppler:  Transvalvular velocity was within the normal range. There was no regurgitation.  ------------------------------------------------------------------- Pulmonary artery:    Systolic pressure could not be accurately estimated.  ------------------------------------------------------------------- Right atrium:  The atrium was normal in size.  ------------------------------------------------------------------- Pericardium:  There was no pericardial effusion.  ------------------------------------------------------------------- Measurements   Left ventricle                          Value        Reference  LV ID, ED, PLAX chordal                49.3  mm     43 - 52  LV ID, ES, PLAX chordal                33.1  mm     23 - 38  LV fx shortening, PLAX chordal         33    %      >=29  LV PW thickness, ED                    9.99  mm     ----------  IVS/LV PW ratio, ED             (H)    1.34         <=1.3  Stroke volume, 2D                      81    ml     ----------  Stroke volume/bsa, 2D                  33    ml/m^2 ----------  LV e&', lateral                         5.55  cm/s   ----------  LV E/e&', lateral                       13.8         ----------  LV e&', medial  4.24  cm/s   ----------  LV E/e&', medial                        18.07        ----------  LV e&', average                         4.9   cm/s   ----------  LV E/e&', average                       15.65        ----------  Longitudinal strain, TDI               14    %      ----------    Ventricular septum                     Value        Reference  IVS thickness, ED                      13.42 mm     ----------    LVOT                                   Value        Reference  LVOT ID, S                             21    mm     ----------  LVOT area                              3.46  cm^2   ----------  LVOT peak velocity, S                  106   cm/s   ----------  LVOT mean velocity, S                  71.5  cm/s   ----------  LVOT VTI, S                            23.3  cm     ----------    Aorta                                  Value        Reference  Aortic root ID, ED                     31    mm     ----------    Left atrium                            Value        Reference  LA ID, A-P, ES                         45    mm     ----------  LA ID/bsa, A-P  1.85  cm/m^2 <=2.2  LA volume, S                           45.6  ml     ----------  LA volume/bsa, S                       18.8  ml/m^2 ----------  LA volume, ES, 1-p A4C                  43.4  ml     ----------  LA volume/bsa, ES, 1-p A4C             17.9  ml/m^2 ----------  LA volume, ES, 1-p A2C                 43.7  ml     ----------  LA volume/bsa, ES, 1-p A2C             18    ml/m^2 ----------    Mitral valve                           Value        Reference  Mitral E-wave peak velocity            76.6  cm/s   ----------  Mitral A-wave peak velocity            85.4  cm/s   ----------  Mitral deceleration time        (H)    275   ms     150 - 230  Mitral peak gradient, D                2     mm Hg  ----------  Mitral E/A ratio, peak                 0.9          ----------    Right atrium                           Value        Reference  RA ID, S-I, ES, A4C             (H)    49.7  mm     34 - 49  RA area, ES, A4C                       14.8  cm^2   8.3 - 19.5  RA volume, ES, A/L                     34.3  ml     ----------  RA volume/bsa, ES, A/L                 14.1  ml/m^2 ----------    Right ventricle                        Value        Reference  RV ID, minor axis, ED, A4C base        32.6  mm     ----------  TAPSE  21    mm     ----------  RV s&', lateral, S                      8.38  cm/s   ----------  Legend: (L)  and  (H)  mark values outside specified reference range.  ------------------------------------------------------------------- Prepared and Electronically Authenticated by  Sanda Klein, MD 2018-05-21T13:38:36    Left Heart Cath and Coronary Angiography  Conclusion    Unstable angina pectoris/non-ST elevation MI  Multifocal high-grade disease involving the proximal and mid LAD (tandem lesions). The LAD gives origin to 2 large diagonals, the first of which contains ostial to proximal 80-85% stenosis and forms a [place] 011 bifurcation lesion with the proximal LAD. The second larger diagonal contains 50-70% mid stenosis.  Circumflex is widely patent without significant stenosis.  Right  coronary is dominant with a large PDA which contains luminal irregularities but no high-grade obstruction.  Normal left ventricular function. Left ventricular end-diastolic pressure is upper normal.  RECOMMENDATION:   CABG with LIMA to LAD and diagonals versus higher risk multilesion PCI. Have notified [name] (his primary cardiologist) and also asked for TCTS consult.  Indications   Unstable angina (HCC) [I20.0 (ICD-10-CM)]  CAD in native artery [I25.10 (ICD-10-CM)]  Procedural Details/Technique   Technical Details The right radial area was sterilely prepped and draped. Intravenous sedation with Versed and fentanyl was administered. 1% Xylocaine was infiltrated to achieve local analgesia. Using real-time vascular ultrasound, a double wall stick with an angiocath was utilized to obtain intra-arterial access. The modified Seldinger technique was used to place a 53F " Slender" sheath in the right radial artery. Weight based heparin was administered. Coronary angiography was done using 5 F catheters. Right coronary angiography was performed with a JR4. Left ventricular hemodymic recordings and angiography was done using the JR 4 catheter and hand injection. Left coronary angiography was performed with a JL 3.5 cm.  Hemostasis was achieved using a pneumatic band.  During this procedure the patient is administered a total of Versed 2 mg and Fentanyl 50 mg to achieve and maintain moderate conscious sedation. The patient's heart rate, blood pressure, and oxygen saturation are monitored continuously during the procedure. The period of conscious sedation is 38 minutes, of which I was present face-to-face 100% of this time.   Estimated blood loss <50 mL.  During this procedure the patient was administered the following to achieve and maintain moderate conscious sedation: Versed 2 mg, Fentanyl 50 mcg, while the patient's heart rate, blood pressure, and oxygen saturation were continuously monitored. The  period of conscious sedation was 38 minutes, of which I was present face-to-face 100% of this time.    Coronary Findings   Dominance: Right  Left Anterior Descending  Vessel is small.  Prox LAD to Mid LAD lesion, 95% stenosed.  Mid LAD lesion, 80% stenosed.  Dist LAD lesion, 95% stenosed.  First Diagonal Branch  Ost 1st Diag to 1st Diag lesion, 80% stenosed.  First Septal Branch  Vessel is small in size.  Second Diagonal Branch  2nd Diag lesion, 65% stenosed.  Lateral Second Diagonal Branch  Vessel is small in size.  Second Septal Branch  Vessel is small in size.  Third Septal Branch  Vessel is small in size.  Ramus Intermedius  Vessel is small.  Left Circumflex  First Obtuse Marginal Branch  Vessel is small in size.  Second Obtuse Marginal Branch  Vessel is small in size.  Third Obtuse Marginal  Branch  Vessel is small in size.  Right Coronary Artery  Right Posterior Descending Artery  Vessel is small in size.  RPDA lesion, 35% stenosed.  Wall Motion              Left Heart   Left Ventricle The left ventricular size is normal. The left ventricular ejection fraction is 50-55% by visual estimate.    Coronary Diagrams   Diagnostic Diagram       Implants     No implant documentation for this case.  PACS Images   Show images for Cardiac catheterization   Link to Procedure Log   Procedure Log    Hemo Data    Most Recent Value  AO Systolic Pressure 626 mmHg  AO Diastolic Pressure 62 mmHg  AO Mean 90 mmHg  LV Systolic Pressure 948 mmHg  LV Diastolic Pressure 6 mmHg  LV EDP 14 mmHg  Arterial Occlusion Pressure Extended Systolic Pressure 546 mmHg  Arterial Occlusion Pressure Extended Diastolic Pressure 69 mmHg  Arterial Occlusion Pressure Extended Mean Pressure 100 mmHg  Left Ventricular Apex Extended Systolic Pressure 270 mmHg  Left Ventricular Apex Extended Diastolic Pressure 8 mmHg  Left Ventricular Apex Extended EDP Pressure 17 mmHg      Impression:  Patient has severe complex single vessel coronary artery disease with preserved left ventricular systolic function and presents with symptoms consistent with unstable angina pectoris. I personally reviewed the patient's transthoracic echocardiogram and diagnostic cardiac catheterization. Echocardiogram reveals normal left ventricular size and systolic function. There is grade 2 diastolic dysfunction consistent with history of long-standing hypertension. Cardiac catheterization reveals complex single vessel coronary artery disease with long segment 95% proximal stenosis of the left anterior descending coronary artery involving complex tight stenosis of a large first diagonal branch. There is diffuse disease in the midportion of this vessel with 99% stenosis of mid left anterior descending coronary artery. Coronary anatomy appears relatively unfavorable for percutaneous coronary intervention.  I agree that this patient with long-standing diabetes would probably best be treated with surgical revascularization.    Plan:  I have reviewed the indications, risks, and potential benefits of coronary artery bypass grafting with the patient and his wife.  Alternative treatment strategies have been discussed, including the relative risks, benefits and long term prognosis associated with medical therapy, percutaneous coronary intervention, and surgical revascularization.  The patient understands and accepts all potential associated risks of surgery including but not limited to risk of death, stroke or other neurologic complication, myocardial infarction, congestive heart failure, respiratory failure, renal failure, bleeding requiring blood transfusion and/or reexploration, aortic dissection or other major vascular complication, arrhythmia, heart block or bradycardia requiring permanent pacemaker, pneumonia, pleural effusion, wound infection, pulmonary embolus or other thromboembolic complication,  chronic pain or other delayed complications related to median sternotomy, or the late recurrence of symptomatic ischemic heart disease and/or congestive heart failure.  The importance of long term risk modification have been emphasized.  All questions answered.  We plan to proceed with surgery tomorrow morning.   I spent in excess of 120 minutes during the conduct of this hospital consultation and >50% of this time involved direct face-to-face encounter for counseling and/or coordination of the patient's care.    Valentina Gu. [name], MD 01/03/2017 3:04 PM

## 2017-01-03 NOTE — Progress Notes (Signed)
Pt in testing when I came to discuss preop ed. Wife present and began asking me questions. Gave her OHS booklet, careguide, and video to watch. Discussed mobility, sternal precautions, post op care, IS. Voiced understanding. She will be able to be with him at d/c. She sts she needs to leave at 5 pm tonight to go home then return.  S[place]ki, ACSM 2:09 PM 01/03/2017

## 2017-01-03 NOTE — Anesthesia Preprocedure Evaluation (Addendum)
Anesthesia Evaluation  Patient identified by MRN, date of birth, ID band Patient awake    Reviewed: Allergy & Precautions, H&P , NPO status , Patient's Chart, lab work & pertinent test results  Airway Mallampati: III  TM Distance: >3 FB Neck ROM: Full    Dental no notable dental hx. (+) Teeth Intact, Dental Advisory Given   Pulmonary neg pulmonary ROS, former smoker,    Pulmonary exam normal breath sounds clear to auscultation       Cardiovascular Exercise Tolerance: Good hypertension, Pt. on medications and Pt. on home beta blockers + angina + CAD and + Past MI   Rhythm:Regular Rate:Normal     Neuro/Psych negative neurological ROS  negative psych ROS   GI/Hepatic negative GI ROS, Neg liver ROS,   Endo/Other  diabetes, Type 2, Oral Hypoglycemic Agents  Renal/GU negative Renal ROS  negative genitourinary   Musculoskeletal   Abdominal   Peds  Hematology negative hematology ROS (+)   Anesthesia Other Findings   Reproductive/Obstetrics negative OB ROS                            Anesthesia Physical Anesthesia Plan  ASA: IV  Anesthesia Plan: General   Post-op Pain Management:    Induction: Intravenous  Airway Management Planned: Oral ETT  Additional Equipment: Arterial line, CVP, PA Cath, TEE and Ultrasound Guidance Line Placement  Intra-op Plan:   Post-operative Plan: Post-operative intubation/ventilation  Informed Consent: I have reviewed the patients History and Physical, chart, labs and discussed the procedure including the risks, benefits and alternatives for the proposed anesthesia with the patient or authorized representative who has indicated his/her understanding and acceptance.   Dental advisory given  Plan Discussed with: CRNA  Anesthesia Plan Comments:         Anesthesia Quick Evaluation

## 2017-01-04 ENCOUNTER — Inpatient Hospital Stay (HOSPITAL_COMMUNITY): Payer: Medicare Other | Admitting: Certified Registered Nurse Anesthetist

## 2017-01-04 ENCOUNTER — Encounter (HOSPITAL_COMMUNITY)
Admission: AD | Disposition: A | Discharge status: Home / Self Care | Payer: Self-pay | Source: Other Acute Inpatient Hospital | Attending: Thoracic Surgery (Cardiothoracic Vascular Surgery)

## 2017-01-04 ENCOUNTER — Inpatient Hospital Stay (HOSPITAL_COMMUNITY): Payer: Medicare Other

## 2017-01-04 ENCOUNTER — Encounter (HOSPITAL_COMMUNITY): Payer: Self-pay | Admitting: Certified Registered Nurse Anesthetist

## 2017-01-04 DIAGNOSIS — Z951 Presence of aortocoronary bypass graft: Secondary | ICD-10-CM

## 2017-01-04 HISTORY — PX: TEE WITHOUT CARDIOVERSION: SHX5443

## 2017-01-04 HISTORY — PX: CORONARY ARTERY BYPASS GRAFT: SHX141

## 2017-01-04 HISTORY — DX: Presence of aortocoronary bypass graft: Z95.1

## 2017-01-04 LAB — POCT I-STAT 3, ART BLOOD GAS (G3+)
ACID-BASE DEFICIT: 1 mmol/L (ref 0.0–2.0)
ACID-BASE EXCESS: 4 mmol/L — AB (ref 0.0–2.0)
ACID-BASE EXCESS: 1 mmol/L (ref 0.0–2.0)
Acid-base deficit: 2 mmol/L (ref 0.0–2.0)
BICARBONATE: 28.4 mmol/L — AB (ref 20.0–28.0)
BICARBONATE: 26.3 mmol/L (ref 20.0–28.0)
Bicarbonate: 24.6 mmol/L (ref 20.0–28.0)
Bicarbonate: 24.2 mmol/L (ref 20.0–28.0)
Bicarbonate: 24.1 mmol/L (ref 20.0–28.0)
O2 SAT: 100 %
O2 SAT: 98 %
O2 Saturation: 100 %
O2 Saturation: 97 %
O2 Saturation: 99 %
PCO2 ART: 41.7 mmHg (ref 32.0–48.0)
PCO2 ART: 41.5 mmHg (ref 32.0–48.0)
PCO2 ART: 42.8 mmHg (ref 32.0–48.0)
PH ART: 7.324 — AB (ref 7.350–7.450)
PO2 ART: 395 mmHg — AB (ref 83.0–108.0)
PO2 ART: 126 mmHg — AB (ref 83.0–108.0)
TCO2: 30 mmol/L (ref 0–100)
TCO2: 28 mmol/L (ref 0–100)
TCO2: 26 mmol/L (ref 0–100)
TCO2: 26 mmol/L (ref 0–100)
TCO2: 25 mmol/L (ref 0–100)
pCO2 arterial: 40.3 mmHg (ref 32.0–48.0)
pCO2 arterial: 46.5 mmHg (ref 32.0–48.0)
pH, Arterial: 7.44 (ref 7.350–7.450)
pH, Arterial: 7.41 (ref 7.350–7.450)
pH, Arterial: 7.394 (ref 7.350–7.450)
pH, Arterial: 7.358 (ref 7.350–7.450)
pO2, Arterial: 276 mmHg — ABNORMAL HIGH (ref 83.0–108.0)
pO2, Arterial: 103 mmHg (ref 83.0–108.0)
pO2, Arterial: 95 mmHg (ref 83.0–108.0)

## 2017-01-04 LAB — POCT I-STAT, CHEM 8
BUN: 13 mg/dL (ref 6–20)
BUN: 13 mg/dL (ref 6–20)
BUN: 11 mg/dL (ref 6–20)
BUN: 12 mg/dL (ref 6–20)
BUN: 10 mg/dL (ref 6–20)
CALCIUM ION: 1.3 mmol/L (ref 1.15–1.40)
CHLORIDE: 99 mmol/L — AB (ref 101–111)
CHLORIDE: 101 mmol/L (ref 101–111)
CHLORIDE: 100 mmol/L — AB (ref 101–111)
CREATININE: 0.8 mg/dL (ref 0.61–1.24)
Calcium, Ion: 1.25 mmol/L (ref 1.15–1.40)
Calcium, Ion: 1.11 mmol/L — ABNORMAL LOW (ref 1.15–1.40)
Calcium, Ion: 1.18 mmol/L (ref 1.15–1.40)
Calcium, Ion: 1.22 mmol/L (ref 1.15–1.40)
Chloride: 102 mmol/L (ref 101–111)
Chloride: 104 mmol/L (ref 101–111)
Creatinine, Ser: 0.8 mg/dL (ref 0.61–1.24)
Creatinine, Ser: 0.9 mg/dL (ref 0.61–1.24)
Creatinine, Ser: 0.7 mg/dL (ref 0.61–1.24)
Creatinine, Ser: 0.8 mg/dL (ref 0.61–1.24)
Glucose, Bld: 161 mg/dL — ABNORMAL HIGH (ref 65–99)
Glucose, Bld: 160 mg/dL — ABNORMAL HIGH (ref 65–99)
Glucose, Bld: 145 mg/dL — ABNORMAL HIGH (ref 65–99)
Glucose, Bld: 155 mg/dL — ABNORMAL HIGH (ref 65–99)
Glucose, Bld: 123 mg/dL — ABNORMAL HIGH (ref 65–99)
HCT: 35 % — ABNORMAL LOW (ref 39.0–52.0)
HEMATOCRIT: 30 % — AB (ref 39.0–52.0)
HEMATOCRIT: 27 % — AB (ref 39.0–52.0)
HEMATOCRIT: 29 % — AB (ref 39.0–52.0)
HEMATOCRIT: 31 % — AB (ref 39.0–52.0)
HEMOGLOBIN: 11.9 g/dL — AB (ref 13.0–17.0)
HEMOGLOBIN: 9.2 g/dL — AB (ref 13.0–17.0)
Hemoglobin: 10.2 g/dL — ABNORMAL LOW (ref 13.0–17.0)
Hemoglobin: 9.9 g/dL — ABNORMAL LOW (ref 13.0–17.0)
Hemoglobin: 10.5 g/dL — ABNORMAL LOW (ref 13.0–17.0)
POTASSIUM: 4.1 mmol/L (ref 3.5–5.1)
POTASSIUM: 5.6 mmol/L — AB (ref 3.5–5.1)
POTASSIUM: 4.3 mmol/L (ref 3.5–5.1)
Potassium: 4 mmol/L (ref 3.5–5.1)
Potassium: 4.4 mmol/L (ref 3.5–5.1)
SODIUM: 139 mmol/L (ref 135–145)
SODIUM: 138 mmol/L (ref 135–145)
SODIUM: 138 mmol/L (ref 135–145)
SODIUM: 138 mmol/L (ref 135–145)
SODIUM: 139 mmol/L (ref 135–145)
TCO2: 31 mmol/L (ref 0–100)
TCO2: 30 mmol/L (ref 0–100)
TCO2: 29 mmol/L (ref 0–100)
TCO2: 28 mmol/L (ref 0–100)
TCO2: 25 mmol/L (ref 0–100)

## 2017-01-04 LAB — GLUCOSE, CAPILLARY
GLUCOSE-CAPILLARY: 113 mg/dL — AB (ref 65–99)
GLUCOSE-CAPILLARY: 106 mg/dL — AB (ref 65–99)
GLUCOSE-CAPILLARY: 107 mg/dL — AB (ref 65–99)
GLUCOSE-CAPILLARY: 125 mg/dL — AB (ref 65–99)
Glucose-Capillary: 166 mg/dL — ABNORMAL HIGH (ref 65–99)
Glucose-Capillary: 135 mg/dL — ABNORMAL HIGH (ref 65–99)
Glucose-Capillary: 121 mg/dL — ABNORMAL HIGH (ref 65–99)
Glucose-Capillary: 114 mg/dL — ABNORMAL HIGH (ref 65–99)
Glucose-Capillary: 130 mg/dL — ABNORMAL HIGH (ref 65–99)

## 2017-01-04 LAB — CBC
HCT: 31 % — ABNORMAL LOW (ref 39.0–52.0)
HCT: 32.1 % — ABNORMAL LOW (ref 39.0–52.0)
HEMATOCRIT: 37.6 % — AB (ref 39.0–52.0)
Hemoglobin: 11.9 g/dL — ABNORMAL LOW (ref 13.0–17.0)
Hemoglobin: 9.8 g/dL — ABNORMAL LOW (ref 13.0–17.0)
Hemoglobin: 10.2 g/dL — ABNORMAL LOW (ref 13.0–17.0)
MCH: 29.7 pg (ref 26.0–34.0)
MCH: 29.5 pg (ref 26.0–34.0)
MCH: 29.6 pg (ref 26.0–34.0)
MCHC: 31.6 g/dL (ref 30.0–36.0)
MCHC: 31.6 g/dL (ref 30.0–36.0)
MCHC: 31.8 g/dL (ref 30.0–36.0)
MCV: 93.8 fL (ref 78.0–100.0)
MCV: 93.4 fL (ref 78.0–100.0)
MCV: 93 fL (ref 78.0–100.0)
PLATELETS: 166 10*3/uL (ref 150–400)
PLATELETS: 177 10*3/uL (ref 150–400)
Platelets: 259 10*3/uL (ref 150–400)
RBC: 4.01 MIL/uL — ABNORMAL LOW (ref 4.22–5.81)
RBC: 3.32 MIL/uL — AB (ref 4.22–5.81)
RBC: 3.45 MIL/uL — ABNORMAL LOW (ref 4.22–5.81)
RDW: 13.9 % (ref 11.5–15.5)
RDW: 13.8 % (ref 11.5–15.5)
RDW: 13.7 % (ref 11.5–15.5)
WBC: 4.7 10*3/uL (ref 4.0–10.5)
WBC: 5.6 10*3/uL (ref 4.0–10.5)
WBC: 9 10*3/uL (ref 4.0–10.5)

## 2017-01-04 LAB — MRSA PCR SCREENING: MRSA BY PCR: NEGATIVE

## 2017-01-04 LAB — CREATININE, SERUM
Creatinine, Ser: 0.8 mg/dL (ref 0.61–1.24)
GFR calc non Af Amer: >60 mL/min (ref >60)

## 2017-01-04 LAB — PROTIME-INR
INR: 1.33
PROTHROMBIN TIME: 16.6 s — AB (ref 11.4–15.2)

## 2017-01-04 LAB — POCT I-STAT 4, (NA,K, GLUC, HGB,HCT)
Glucose, Bld: 139 mg/dL — ABNORMAL HIGH (ref 65–99)
HCT: 30 % — ABNORMAL LOW (ref 39.0–52.0)
Hemoglobin: 10.2 g/dL — ABNORMAL LOW (ref 13.0–17.0)
Potassium: 4.1 mmol/L (ref 3.5–5.1)
Sodium: 139 mmol/L (ref 135–145)

## 2017-01-04 LAB — SURGICAL PCR SCREEN
MRSA, PCR: INVALID — AB
STAPHYLOCOCCUS AUREUS: INVALID — AB

## 2017-01-04 LAB — HEMOGLOBIN A1C
Hgb A1c MFr Bld: 7.2 % — ABNORMAL HIGH (ref 4.8–5.6)
Mean Plasma Glucose: 160 mg/dL

## 2017-01-04 LAB — HEMOGLOBIN AND HEMATOCRIT, BLOOD
HEMATOCRIT: 28.3 % — AB (ref 39.0–52.0)
Hemoglobin: 9.1 g/dL — ABNORMAL LOW (ref 13.0–17.0)

## 2017-01-04 LAB — PLATELET COUNT: Platelets: 191 10*3/uL (ref 150–400)

## 2017-01-04 LAB — MAGNESIUM: Magnesium: 2.8 mg/dL — ABNORMAL HIGH (ref 1.7–2.4)

## 2017-01-04 LAB — APTT: APTT: 38 s — AB (ref 24–36)

## 2017-01-04 SURGERY — CORONARY ARTERY BYPASS GRAFTING (CABG)
Anesthesia: General | Site: Chest

## 2017-01-04 MED ORDER — SODIUM CHLORIDE 0.9 % IV SOLN
INTRAVENOUS | Status: DC
  Administered 2017-01-05: 2.6 [IU]/h via INTRAVENOUS
  Filled 2017-01-04: qty 1

## 2017-01-04 MED ORDER — ACETAMINOPHEN 160 MG/5ML PO SOLN: 1000.0000 mg | Freq: Four times a day (QID) | ORAL | Status: DC

## 2017-01-04 MED ORDER — ROCURONIUM BROMIDE 10 MG/ML (PF) SYRINGE
PREFILLED_SYRINGE | INTRAVENOUS | Status: DC | PRN
  Administered 2017-01-04: 50 mg via INTRAVENOUS
  Administered 2017-01-04: 70 mg via INTRAVENOUS
  Administered 2017-01-04: 30 mg via INTRAVENOUS
  Administered 2017-01-04: 50 mg via INTRAVENOUS

## 2017-01-04 MED ORDER — LACTATED RINGERS IV SOLN
INTRAVENOUS | Status: DC | PRN
  Administered 2017-01-04: 07:00:00 via INTRAVENOUS

## 2017-01-04 MED ORDER — SODIUM CHLORIDE 0.9% FLUSH
3.0000 mL | Freq: Two times a day (BID) | INTRAVENOUS | Status: DC
  Administered 2017-01-05 – 2017-01-07 (×6): 3 mL via INTRAVENOUS

## 2017-01-04 MED ORDER — FENTANYL CITRATE (PF) 250 MCG/5ML IJ SOLN
INTRAMUSCULAR | Status: DC | PRN
  Administered 2017-01-04: 200 ug via INTRAVENOUS
  Administered 2017-01-04: 250 ug via INTRAVENOUS
  Administered 2017-01-04: 50 ug via INTRAVENOUS
  Administered 2017-01-04: 150 ug via INTRAVENOUS
  Administered 2017-01-04 (×2): 100 ug via INTRAVENOUS
  Administered 2017-01-04: 500 ug via INTRAVENOUS
  Administered 2017-01-04: 150 ug via INTRAVENOUS

## 2017-01-04 MED ORDER — SODIUM CHLORIDE 0.9 % IV SOLN
INTRAVENOUS | Status: DC | PRN
  Administered 2017-01-04: 11:00:00 via INTRAVENOUS

## 2017-01-04 MED ORDER — INSULIN REGULAR BOLUS VIA INFUSION
0.0000 [IU] | Freq: Three times a day (TID) | INTRAVENOUS | Status: DC
  Filled 2017-01-04: qty 10

## 2017-01-04 MED ORDER — SODIUM CHLORIDE 0.9 % IV SOLN
INTRAVENOUS | Status: AC
  Administered 2017-01-04: 12:00:00 via INTRAVENOUS

## 2017-01-04 MED ORDER — 0.9 % SODIUM CHLORIDE (POUR BTL) OPTIME
TOPICAL | Status: DC | PRN
  Administered 2017-01-04: 6000 mL

## 2017-01-04 MED ORDER — LACTATED RINGERS IV SOLN: INTRAVENOUS | Status: DC

## 2017-01-04 MED ORDER — GELATIN ABSORBABLE MT POWD
OROMUCOSAL | Status: DC | PRN
  Administered 2017-01-04: 09:00:00 via TOPICAL

## 2017-01-04 MED ORDER — ACETAMINOPHEN 500 MG PO TABS
1000.0000 mg | ORAL_TABLET | Freq: Four times a day (QID) | ORAL | Status: DC
  Administered 2017-01-04 – 2017-01-08 (×14): 1000 mg via ORAL
  Filled 2017-01-04 (×11): qty 2

## 2017-01-04 MED ORDER — SODIUM CHLORIDE 0.9 % IV SOLN: 0.0000 ug/min | INTRAVENOUS | Status: DC

## 2017-01-04 MED ORDER — HEMOSTATIC AGENTS (NO CHARGE) OPTIME
TOPICAL | Status: DC | PRN
  Administered 2017-01-04: 1 via TOPICAL

## 2017-01-04 MED ORDER — LABETALOL HCL 5 MG/ML IV SOLN
10.0000 mg | INTRAVENOUS | Status: DC | PRN
  Administered 2017-01-04 – 2017-01-06 (×3): 10 mg via INTRAVENOUS
  Filled 2017-01-04 (×4): qty 4

## 2017-01-04 MED ORDER — OXYCODONE HCL 5 MG PO TABS
5.0000 mg | ORAL_TABLET | ORAL | Status: DC | PRN
  Administered 2017-01-04 (×2): 5 mg via ORAL
  Administered 2017-01-05 – 2017-01-06 (×3): 10 mg via ORAL
  Administered 2017-01-07: 5 mg via ORAL
  Administered 2017-01-07: 10 mg via ORAL
  Administered 2017-01-07 – 2017-01-08 (×2): 5 mg via ORAL
  Filled 2017-01-04: qty 1
  Filled 2017-01-04: qty 2
  Filled 2017-01-04: qty 1
  Filled 2017-01-04 (×2): qty 2
  Filled 2017-01-04 (×3): qty 1
  Filled 2017-01-04: qty 2

## 2017-01-04 MED ORDER — SODIUM CHLORIDE 0.9% FLUSH: 3.0000 mL | INTRAVENOUS | Status: DC | PRN

## 2017-01-04 MED ORDER — MORPHINE SULFATE (PF) 4 MG/ML IV SOLN
1.0000 mg | INTRAVENOUS | Status: DC | PRN
Start: 2017-01-04 — End: 2017-01-05
  Administered 2017-01-04 – 2017-01-05 (×7): 2 mg via INTRAVENOUS
  Filled 2017-01-04 (×7): qty 1

## 2017-01-04 MED ORDER — ASPIRIN 81 MG PO CHEW: 324.0000 mg | CHEWABLE_TABLET | Freq: Every day | ORAL | Status: DC

## 2017-01-04 MED ORDER — PROPOFOL 10 MG/ML IV BOLUS
INTRAVENOUS | Status: DC | PRN
  Administered 2017-01-04 (×3): 50 mg via INTRAVENOUS

## 2017-01-04 MED ORDER — VANCOMYCIN HCL IN DEXTROSE 1-5 GM/200ML-% IV SOLN
1000.0000 mg | Freq: Once | INTRAVENOUS | Status: AC
  Administered 2017-01-04: 1000 mg via INTRAVENOUS
  Filled 2017-01-04: qty 200

## 2017-01-04 MED ORDER — CHLORHEXIDINE GLUCONATE 0.12% ORAL RINSE (MEDLINE KIT)
15.0000 mL | Freq: Two times a day (BID) | OROMUCOSAL | Status: DC
  Administered 2017-01-04: 15 mL via OROMUCOSAL

## 2017-01-04 MED ORDER — MIDAZOLAM HCL 10 MG/2ML IJ SOLN
INTRAMUSCULAR | Status: AC
  Filled 2017-01-04: qty 2

## 2017-01-04 MED ORDER — POTASSIUM CHLORIDE 10 MEQ/50ML IV SOLN: 10.0000 meq | INTRAVENOUS | Status: AC

## 2017-01-04 MED ORDER — HEPARIN SODIUM (PORCINE) 1000 UNIT/ML IJ SOLN
INTRAMUSCULAR | Status: AC
  Filled 2017-01-04: qty 1

## 2017-01-04 MED ORDER — HEPARIN SODIUM (PORCINE) 1000 UNIT/ML IJ SOLN
INTRAMUSCULAR | Status: DC | PRN
  Administered 2017-01-04: 45000 [IU] via INTRAVENOUS

## 2017-01-04 MED ORDER — FAMOTIDINE IN NACL 20-0.9 MG/50ML-% IV SOLN
20.0000 mg | Freq: Two times a day (BID) | INTRAVENOUS | Status: DC
  Administered 2017-01-04: 20 mg via INTRAVENOUS

## 2017-01-04 MED ORDER — SODIUM CHLORIDE 0.9 % IV SOLN
0.0000 ug/kg/h | INTRAVENOUS | Status: DC
  Administered 2017-01-04: 0.2 ug/kg/h via INTRAVENOUS
  Administered 2017-01-04: 0.5 ug/kg/h via INTRAVENOUS
  Filled 2017-01-04 (×2): qty 2

## 2017-01-04 MED ORDER — LACTATED RINGERS IV SOLN
INTRAVENOUS | Status: DC | PRN
  Administered 2017-01-04 (×2): via INTRAVENOUS

## 2017-01-04 MED ORDER — MIDAZOLAM HCL 5 MG/5ML IJ SOLN
INTRAMUSCULAR | Status: DC | PRN
  Administered 2017-01-04: 3 mg via INTRAVENOUS
  Administered 2017-01-04: 2 mg via INTRAVENOUS
  Administered 2017-01-04: 3 mg via INTRAVENOUS
  Administered 2017-01-04: 2 mg via INTRAVENOUS

## 2017-01-04 MED ORDER — PROTAMINE SULFATE 10 MG/ML IV SOLN
INTRAVENOUS | Status: AC
  Filled 2017-01-04: qty 50

## 2017-01-04 MED ORDER — NITROGLYCERIN IN D5W 200-5 MCG/ML-% IV SOLN
0.0000 ug/min | INTRAVENOUS | Status: DC
  Administered 2017-01-05: 60 ug/min via INTRAVENOUS
  Filled 2017-01-04: qty 250

## 2017-01-04 MED ORDER — MUPIROCIN 2 % EX OINT
TOPICAL_OINTMENT | Freq: Once | CUTANEOUS | Status: AC
  Administered 2017-01-04: 1 via NASAL
  Filled 2017-01-04: qty 22

## 2017-01-04 MED ORDER — PROPOFOL 10 MG/ML IV BOLUS
INTRAVENOUS | Status: AC
  Filled 2017-01-04: qty 20

## 2017-01-04 MED ORDER — ONDANSETRON HCL 4 MG/2ML IJ SOLN
4.0000 mg | Freq: Four times a day (QID) | INTRAMUSCULAR | Status: DC | PRN
  Administered 2017-01-05 – 2017-01-06 (×2): 4 mg via INTRAVENOUS
  Filled 2017-01-04 (×2): qty 2

## 2017-01-04 MED ORDER — ORAL CARE MOUTH RINSE: 15.0000 mL | Freq: Four times a day (QID) | OROMUCOSAL | Status: DC

## 2017-01-04 MED ORDER — LACTATED RINGERS IV SOLN: 500.0000 mL | Freq: Once | INTRAVENOUS | Status: DC | PRN

## 2017-01-04 MED ORDER — ROCURONIUM BROMIDE 10 MG/ML (PF) SYRINGE
PREFILLED_SYRINGE | INTRAVENOUS | Status: AC
  Filled 2017-01-04: qty 10

## 2017-01-04 MED ORDER — FENTANYL CITRATE (PF) 250 MCG/5ML IJ SOLN
INTRAMUSCULAR | Status: AC
  Filled 2017-01-04: qty 25

## 2017-01-04 MED ORDER — SODIUM CHLORIDE 0.45 % IV SOLN
INTRAVENOUS | Status: DC | PRN
  Administered 2017-01-04: 12:00:00 via INTRAVENOUS

## 2017-01-04 MED ORDER — CALCIUM CHLORIDE 10 % IV SOLN
INTRAVENOUS | Status: DC | PRN
  Administered 2017-01-04: 500 mg via INTRAVENOUS

## 2017-01-04 MED ORDER — VECURONIUM BROMIDE 10 MG IV SOLR
INTRAVENOUS | Status: AC
  Filled 2017-01-04: qty 10

## 2017-01-04 MED ORDER — SODIUM CHLORIDE 0.9 % IV SOLN: 250.0000 mL | INTRAVENOUS | Status: DC

## 2017-01-04 MED ORDER — DEXTROSE 5 % IV SOLN
1.5000 g | Freq: Two times a day (BID) | INTRAVENOUS | Status: AC
  Administered 2017-01-04 – 2017-01-06 (×4): 1.5 g via INTRAVENOUS
  Filled 2017-01-04 (×4): qty 1.5

## 2017-01-04 MED ORDER — MAGNESIUM SULFATE 4 GM/100ML IV SOLN
4.0000 g | Freq: Once | INTRAVENOUS | Status: AC
Start: 2017-01-04 — End: 2017-01-04
  Administered 2017-01-04: 4 g via INTRAVENOUS
  Filled 2017-01-04: qty 100

## 2017-01-04 MED ORDER — MIDAZOLAM HCL 2 MG/2ML IJ SOLN: 2.0000 mg | INTRAMUSCULAR | Status: DC | PRN

## 2017-01-04 MED ORDER — SODIUM CHLORIDE 0.9 % IV SOLN: INTRAVENOUS | Status: DC

## 2017-01-04 MED ORDER — ASPIRIN EC 325 MG PO TBEC: 325.0000 mg | DELAYED_RELEASE_TABLET | Freq: Every day | ORAL | Status: DC

## 2017-01-04 MED ORDER — BISACODYL 5 MG PO TBEC
10.0000 mg | DELAYED_RELEASE_TABLET | Freq: Every day | ORAL | Status: DC
  Administered 2017-01-05 – 2017-01-07 (×3): 10 mg via ORAL
  Filled 2017-01-04 (×3): qty 2

## 2017-01-04 MED ORDER — HEPARIN SODIUM (PORCINE) 1000 UNIT/ML IJ SOLN
INTRAMUSCULAR | Status: AC
  Filled 2017-01-04: qty 2

## 2017-01-04 MED ORDER — PHENYLEPHRINE HCL 10 MG/ML IJ SOLN
INTRAVENOUS | Status: DC | PRN
  Administered 2017-01-04: 15 ug/min via INTRAVENOUS

## 2017-01-04 MED ORDER — CHLORHEXIDINE GLUCONATE 0.12 % MT SOLN
15.0000 mL | OROMUCOSAL | Status: AC
  Administered 2017-01-04: 15 mL via OROMUCOSAL

## 2017-01-04 MED ORDER — SODIUM CHLORIDE 0.9 % IJ SOLN
OROMUCOSAL | Status: DC | PRN
  Administered 2017-01-04: 09:00:00 via TOPICAL

## 2017-01-04 MED ORDER — DOCUSATE SODIUM 100 MG PO CAPS
200.0000 mg | ORAL_CAPSULE | Freq: Every day | ORAL | Status: DC
  Administered 2017-01-05 – 2017-01-07 (×3): 200 mg via ORAL
  Filled 2017-01-04 (×3): qty 2

## 2017-01-04 MED ORDER — NITROGLYCERIN 0.2 MG/ML ON CALL CATH LAB
INTRAVENOUS | Status: DC | PRN
  Administered 2017-01-04 (×3): 40 ug via INTRAVENOUS

## 2017-01-04 MED ORDER — ACETAMINOPHEN 650 MG RE SUPP
650.0000 mg | Freq: Once | RECTAL | Status: AC
  Administered 2017-01-04: 650 mg via RECTAL

## 2017-01-04 MED ORDER — VECURONIUM BROMIDE 10 MG IV SOLR
INTRAVENOUS | Status: DC | PRN
Start: 2017-01-04 — End: 2017-01-04
  Administered 2017-01-04: 5 mg via INTRAVENOUS

## 2017-01-04 MED ORDER — ALBUMIN HUMAN 5 % IV SOLN
INTRAVENOUS | Status: DC | PRN
  Administered 2017-01-04 (×2): via INTRAVENOUS

## 2017-01-04 MED ORDER — ATORVASTATIN CALCIUM 80 MG PO TABS
80.0000 mg | ORAL_TABLET | Freq: Every day | ORAL | Status: DC
  Administered 2017-01-07: 80 mg via ORAL
  Filled 2017-01-04: qty 1

## 2017-01-04 MED ORDER — TRAMADOL HCL 50 MG PO TABS
50.0000 mg | ORAL_TABLET | ORAL | Status: DC | PRN
  Administered 2017-01-05 – 2017-01-07 (×6): 100 mg via ORAL
  Filled 2017-01-04 (×6): qty 2

## 2017-01-04 MED ORDER — METOPROLOL TARTRATE 12.5 MG HALF TABLET
12.5000 mg | ORAL_TABLET | Freq: Two times a day (BID) | ORAL | Status: DC
  Filled 2017-01-04: qty 1

## 2017-01-04 MED ORDER — ACETAMINOPHEN 160 MG/5ML PO SOLN: 650.0000 mg | Freq: Once | ORAL | Status: AC

## 2017-01-04 MED ORDER — FENTANYL CITRATE (PF) 250 MCG/5ML IJ SOLN
INTRAMUSCULAR | Status: AC
  Filled 2017-01-04: qty 5

## 2017-01-04 MED ORDER — METOPROLOL TARTRATE 25 MG/10 ML ORAL SUSPENSION: 12.5000 mg | Freq: Two times a day (BID) | ORAL | Status: DC

## 2017-01-04 MED ORDER — BISACODYL 10 MG RE SUPP: 10.0000 mg | Freq: Every day | RECTAL | Status: DC

## 2017-01-04 MED ORDER — MORPHINE SULFATE (PF) 4 MG/ML IV SOLN
1.0000 mg | INTRAVENOUS | Status: AC | PRN
  Administered 2017-01-04: 4 mg via INTRAVENOUS
  Filled 2017-01-04: qty 1

## 2017-01-04 MED ORDER — SODIUM CHLORIDE 0.9 % IJ SOLN
INTRAMUSCULAR | Status: AC
  Filled 2017-01-04: qty 10

## 2017-01-04 MED ORDER — METOPROLOL TARTRATE 5 MG/5ML IV SOLN
2.5000 mg | INTRAVENOUS | Status: DC | PRN
  Administered 2017-01-06: 5 mg via INTRAVENOUS
  Filled 2017-01-04 (×2): qty 5

## 2017-01-04 MED ORDER — PROTAMINE SULFATE 10 MG/ML IV SOLN
INTRAVENOUS | Status: DC | PRN
  Administered 2017-01-04: 400 mg via INTRAVENOUS

## 2017-01-04 MED ORDER — PANTOPRAZOLE SODIUM 40 MG PO TBEC
40.0000 mg | DELAYED_RELEASE_TABLET | Freq: Every day | ORAL | Status: DC
  Administered 2017-01-06 – 2017-01-07 (×2): 40 mg via ORAL
  Filled 2017-01-04 (×2): qty 1

## 2017-01-04 MED ORDER — ALBUMIN HUMAN 5 % IV SOLN
250.0000 mL | INTRAVENOUS | Status: AC | PRN
  Administered 2017-01-04 (×4): 250 mL via INTRAVENOUS
  Filled 2017-01-04: qty 250

## 2017-01-04 MED FILL — Sodium Bicarbonate IV Soln 8.4%: INTRAVENOUS | Qty: 50 | Status: AC

## 2017-01-04 MED FILL — Lidocaine HCl IV Inj 20 MG/ML: INTRAVENOUS | Qty: 5 | Status: AC

## 2017-01-04 MED FILL — Magnesium Sulfate Inj 50%: INTRAMUSCULAR | Qty: 10 | Status: AC

## 2017-01-04 MED FILL — Heparin Sodium (Porcine) Inj 1000 Unit/ML: INTRAMUSCULAR | Qty: 30 | Status: AC

## 2017-01-04 MED FILL — Potassium Chloride Inj 2 mEq/ML: INTRAVENOUS | Qty: 10 | Status: AC

## 2017-01-04 MED FILL — Sodium Chloride IV Soln 0.9%: INTRAVENOUS | Qty: 2000 | Status: AC

## 2017-01-04 MED FILL — Electrolyte-R (PH 7.4) Solution: INTRAVENOUS | Qty: 3000 | Status: AC

## 2017-01-04 MED FILL — Mannitol IV Soln 20%: INTRAVENOUS | Qty: 500 | Status: AC

## 2017-01-04 SURGICAL SUPPLY — 114 items
ADAPTER MULTI PERFUSION 15 (ADAPTER) ×3 IMPLANT
APPLIER CLIP 9.375 SM OPEN (CLIP) ×3
BAG DECANTER FOR FLEXI CONT (MISCELLANEOUS) ×6 IMPLANT
BANDAGE ACE 4X5 VEL STRL LF (GAUZE/BANDAGES/DRESSINGS) ×3 IMPLANT
BANDAGE ACE 6X5 VEL STRL LF (GAUZE/BANDAGES/DRESSINGS) ×3 IMPLANT
BASKET HEART (ORDER IN 25'S) (MISCELLANEOUS) ×1
BASKET HEART (ORDER IN 25S) (MISCELLANEOUS) ×2 IMPLANT
BLADE CLIPPER SURG (BLADE) ×3 IMPLANT
BLADE STERNUM SYSTEM 6 (BLADE) ×3 IMPLANT
BLADE SURG 11 STRL SS (BLADE) ×3 IMPLANT
BNDG GAUZE ELAST 4 BULKY (GAUZE/BANDAGES/DRESSINGS) ×3 IMPLANT
CANISTER SUCT 3000ML PPV (MISCELLANEOUS) ×3 IMPLANT
CANNULA EZ GLIDE AORTIC 21FR (CANNULA) ×6 IMPLANT
CANNULA MC2 2 STG 36/46 NON-V (CANNULA) ×2 IMPLANT
CANNULA VENOUS 2 STG 34/46 (CANNULA) ×1
CATH CPB KIT OWEN (MISCELLANEOUS) ×3 IMPLANT
CATH THORACIC 36FR (CATHETERS) ×3 IMPLANT
CLIP APPLIE 9.375 SM OPEN (CLIP) ×2 IMPLANT
CLIP TI MEDIUM 24 (CLIP) IMPLANT
CLIP TI WIDE RED SMALL 24 (CLIP) IMPLANT
CRADLE DONUT ADULT HEAD (MISCELLANEOUS) ×3 IMPLANT
DERMABOND ADVANCED (GAUZE/BANDAGES/DRESSINGS) ×1
DERMABOND ADVANCED .7 DNX12 (GAUZE/BANDAGES/DRESSINGS) ×2 IMPLANT
DRAIN CHANNEL 32F RND 10.7 FF (WOUND CARE) ×6 IMPLANT
DRAPE CARDIOVASCULAR INCISE (DRAPES) ×1
DRAPE INCISE IOBAN 66X45 STRL (DRAPES) IMPLANT
DRAPE SLUSH/WARMER DISC (DRAPES) ×3 IMPLANT
DRAPE SRG 135X102X78XABS (DRAPES) ×2 IMPLANT
DRSG AQUACEL AG ADV 3.5X14 (GAUZE/BANDAGES/DRESSINGS) ×3 IMPLANT
DRSG COVADERM 4X14 (GAUZE/BANDAGES/DRESSINGS) ×3 IMPLANT
ELECT BLADE 4.0 EZ CLEAN MEGAD (MISCELLANEOUS) ×3
ELECT REM PT RETURN 9FT ADLT (ELECTROSURGICAL) ×6
ELECTRODE BLDE 4.0 EZ CLN MEGD (MISCELLANEOUS) ×2 IMPLANT
ELECTRODE REM PT RTRN 9FT ADLT (ELECTROSURGICAL) ×4 IMPLANT
FELT TEFLON 1X6 (MISCELLANEOUS) ×3 IMPLANT
GAUZE SPONGE 4X4 12PLY STRL (GAUZE/BANDAGES/DRESSINGS) ×6 IMPLANT
GLOVE BIO SURGEON STRL SZ 6.5 (GLOVE) ×6 IMPLANT
GLOVE BIOGEL M 6.5 STRL (GLOVE) ×12 IMPLANT
GLOVE BIOGEL M STER SZ 6 (GLOVE) ×6 IMPLANT
GLOVE BIOGEL PI IND STRL 6 (GLOVE) ×2 IMPLANT
GLOVE BIOGEL PI IND STRL 6.5 (GLOVE) ×4 IMPLANT
GLOVE BIOGEL PI IND STRL 7.0 (GLOVE) ×4 IMPLANT
GLOVE BIOGEL PI INDICATOR 6 (GLOVE) ×1
GLOVE BIOGEL PI INDICATOR 6.5 (GLOVE) ×2
GLOVE BIOGEL PI INDICATOR 7.0 (GLOVE) ×2
GLOVE ORTHO TXT STRL SZ7.5 (GLOVE) ×6 IMPLANT
GOWN STRL REUS W/ TWL LRG LVL3 (GOWN DISPOSABLE) ×16 IMPLANT
GOWN STRL REUS W/TWL LRG LVL3 (GOWN DISPOSABLE) ×8
HEMOSTAT POWDER SURGIFOAM 1G (HEMOSTASIS) ×9 IMPLANT
INSERT FOGARTY XLG (MISCELLANEOUS) ×3 IMPLANT
KIT BASIN OR (CUSTOM PROCEDURE TRAY) ×3 IMPLANT
KIT ROOM TURNOVER OR (KITS) ×3 IMPLANT
KIT SUCTION CATH 14FR (SUCTIONS) ×9 IMPLANT
KIT VASOVIEW HEMOPRO VH 3000 (KITS) ×3 IMPLANT
LEAD PACING MYOCARDI (MISCELLANEOUS) ×3 IMPLANT
MARKER GRAFT CORONARY BYPASS (MISCELLANEOUS) ×9 IMPLANT
NS IRRIG 1000ML POUR BTL (IV SOLUTION) ×15 IMPLANT
PACK OPEN HEART (CUSTOM PROCEDURE TRAY) ×3 IMPLANT
PAD ARMBOARD 7.5X6 YLW CONV (MISCELLANEOUS) ×6 IMPLANT
PAD ELECT DEFIB RADIOL ZOLL (MISCELLANEOUS) ×3 IMPLANT
PENCIL BUTTON HOLSTER BLD 10FT (ELECTRODE) ×3 IMPLANT
PUNCH AORTIC ROTATE  4.5MM 8IN (MISCELLANEOUS) ×3 IMPLANT
PUNCH AORTIC ROTATE 4.0MM (MISCELLANEOUS) IMPLANT
PUNCH AORTIC ROTATE 4.5MM 8IN (MISCELLANEOUS) IMPLANT
PUNCH AORTIC ROTATE 5MM 8IN (MISCELLANEOUS) IMPLANT
SET CARDIOPLEGIA MPS 5001102 (MISCELLANEOUS) ×3 IMPLANT
SOLUTION ANTI FOG 6CC (MISCELLANEOUS) ×3 IMPLANT
SPONGE LAP 18X18 X RAY DECT (DISPOSABLE) ×3 IMPLANT
SPONGE LAP 4X18 X RAY DECT (DISPOSABLE) IMPLANT
SUT BONE WAX W31G (SUTURE) ×3 IMPLANT
SUT ETHIBOND X763 2 0 SH 1 (SUTURE) ×6 IMPLANT
SUT MNCRL AB 3-0 PS2 18 (SUTURE) ×6 IMPLANT
SUT MNCRL AB 4-0 PS2 18 (SUTURE) ×3 IMPLANT
SUT PDS AB 1 CTX 36 (SUTURE) ×6 IMPLANT
SUT PROLENE 2 0 SH DA (SUTURE) IMPLANT
SUT PROLENE 3 0 SH DA (SUTURE) ×3 IMPLANT
SUT PROLENE 3 0 SH1 36 (SUTURE) IMPLANT
SUT PROLENE 4 0 RB 1 (SUTURE)
SUT PROLENE 4 0 SH DA (SUTURE) IMPLANT
SUT PROLENE 4-0 RB1 .5 CRCL 36 (SUTURE) IMPLANT
SUT PROLENE 5 0 C 1 36 (SUTURE) IMPLANT
SUT PROLENE 6 0 C 1 30 (SUTURE) IMPLANT
SUT PROLENE 7.0 RB 3 (SUTURE) ×9 IMPLANT
SUT PROLENE 8 0 BV175 6 (SUTURE) ×3 IMPLANT
SUT PROLENE BLUE 7 0 (SUTURE) ×3 IMPLANT
SUT PROLENE POLY MONO (SUTURE) IMPLANT
SUT SILK  1 MH (SUTURE) ×1
SUT SILK 1 MH (SUTURE) ×2 IMPLANT
SUT STEEL 6MS V (SUTURE) IMPLANT
SUT STEEL STERNAL CCS#1 18IN (SUTURE) ×3 IMPLANT
SUT STEEL SZ 6 DBL 3X14 BALL (SUTURE) ×6 IMPLANT
SUT VIC AB 1 CTX 36 (SUTURE)
SUT VIC AB 1 CTX36XBRD ANBCTR (SUTURE) IMPLANT
SUT VIC AB 2-0 CT1 27 (SUTURE) ×1
SUT VIC AB 2-0 CT1 TAPERPNT 27 (SUTURE) ×2 IMPLANT
SUT VIC AB 2-0 CTX 27 (SUTURE) IMPLANT
SUT VIC AB 3-0 SH 27 (SUTURE)
SUT VIC AB 3-0 SH 27X BRD (SUTURE) IMPLANT
SUT VIC AB 3-0 X1 27 (SUTURE) IMPLANT
SUT VICRYL 4-0 PS2 18IN ABS (SUTURE) IMPLANT
SUTURE E-PAK OPEN HEART (SUTURE) ×3 IMPLANT
SYSTEM SAHARA CHEST DRAIN ATS (WOUND CARE) ×3 IMPLANT
TAPE CLOTH SURG 6X10 WHT LF (GAUZE/BANDAGES/DRESSINGS) ×3 IMPLANT
TAPE PAPER 2X10 WHT MICROPORE (GAUZE/BANDAGES/DRESSINGS) ×3 IMPLANT
TOWEL GREEN STERILE (TOWEL DISPOSABLE) ×3 IMPLANT
TOWEL GREEN STERILE FF (TOWEL DISPOSABLE) IMPLANT
TOWEL OR 17X24 6PK STRL BLUE (TOWEL DISPOSABLE) ×6 IMPLANT
TOWEL OR 17X26 10 PK STRL BLUE (TOWEL DISPOSABLE) ×6 IMPLANT
TRAY FOLEY SILVER 16FR TEMP (SET/KITS/TRAYS/PACK) ×3 IMPLANT
TUBE SUCT INTRACARD DLP 20F (MISCELLANEOUS) ×3 IMPLANT
TUBE SUCTION CARDIAC 10FR (CANNULA) ×3 IMPLANT
TUBING INSUFFLATION (TUBING) ×3 IMPLANT
UNDERPAD 30X30 (UNDERPADS AND DIAPERS) ×3 IMPLANT
WATER STERILE IRR 1000ML POUR (IV SOLUTION) ×6 IMPLANT

## 2017-01-04 NOTE — Progress Notes (Signed)
  Echocardiogram Echocardiogram Transesophageal has been performed.  William Orr T [name] 01/04/2017, 9:20 AM

## 2017-01-04 NOTE — Transfer of Care (Signed)
Immediate Anesthesia Transfer of Care Note  Patient: William Orr  Procedure(s) Performed: Procedure(s): CORONARY ARTERY BYPASS GRAFTING (CABG)X3, ON PUMP, USING LEFT INTERNAL MAMMARY ARTERY AND RIGHT GREATER SAPHENOUS VEIN HARVESTED ENDOSCOPICALLY, LIMA-LAD, SEQ SVG- DIAG 1 -DIAG 2 (N/A) TRANSESOPHAGEAL ECHOCARDIOGRAM (TEE) (N/A)  Patient Location: SICU  Anesthesia Type:General  Level of Consciousness: sedated, unresponsive and Patient remains intubated per anesthesia plan  Airway & Oxygen Therapy: Patient remains intubated per anesthesia plan and Patient placed on Ventilator (see vital sign flow sheet for setting)  Post-op Assessment: Report given to RN and Post -op Vital signs reviewed and stable  Post vital signs: Reviewed and stable  Last Vitals:  Vitals:   01/04/17 0501 01/04/17 0544  BP:  (!) 125/46  Pulse: 62 60  Resp:  16  Temp:  36.7 C    Last Pain:  Vitals:   01/04/17 0544  TempSrc: Oral  PainSc:       Patients Stated Pain Goal: 0 (09/25/15 3567)  Complications: No apparent anesthesia complications

## 2017-01-04 NOTE — Anesthesia Procedure Notes (Signed)
Procedure Name: Intubation Date/Time: 01/04/2017 7:56 AM Performed by: Everlean Cherry A Pre-anesthesia Checklist: Patient identified, Emergency Drugs available, Suction available and Patient being monitored Patient Re-evaluated:Patient Re-evaluated prior to inductionOxygen Delivery Method: Circle system utilized Preoxygenation: Pre-oxygenation with 100% oxygen Intubation Type: IV induction Ventilation: Mask ventilation without difficulty and Oral airway inserted - appropriate to patient size Laryngoscope Size: Sabra Heck and 2 Grade View: Grade II Tube type: Oral Tube size: 8.0 mm Number of attempts: 1 Airway Equipment and Method: Stylet Placement Confirmation: ETT inserted through vocal cords under direct vision,  positive ETCO2 and breath sounds checked- equal and bilateral Secured at: 23 cm Tube secured with: Tape Dental Injury: Teeth and Oropharynx as per pre-operative assessment

## 2017-01-04 NOTE — Anesthesia Procedure Notes (Signed)
Central Venous Catheter Insertion Performed by: Lillia Abed, anesthesiologist Start/End5/24/2018 6:30 AM, 01/04/2017 6:45 AM Patient location: Pre-op. Preanesthetic checklist: patient identified, IV checked, risks and benefits discussed, surgical consent, monitors and equipment checked, pre-op evaluation, timeout performed and anesthesia consent Lidocaine 1% used for infiltration and patient sedated Hand hygiene performed  and maximum sterile barriers used  Catheter size: 8.5 Fr Sheath introducer Procedure performed using ultrasound guided technique. Ultrasound Notes:anatomy identified, needle tip was noted to be adjacent to the nerve/plexus identified, no ultrasound evidence of intravascular and/or intraneural injection and image(s) printed for medical record Attempts: 1 Following insertion, line sutured and dressing applied. Post procedure assessment: blood return through all ports, free fluid flow and no air  Patient tolerated the procedure well with no immediate complications.

## 2017-01-04 NOTE — Anesthesia Postprocedure Evaluation (Signed)
Anesthesia Post Note  Patient: William Orr  Procedure(s) Performed: Procedure(s) (LRB): CORONARY ARTERY BYPASS GRAFTING (CABG)X3, ON PUMP, USING LEFT INTERNAL MAMMARY ARTERY AND RIGHT GREATER SAPHENOUS VEIN HARVESTED ENDOSCOPICALLY, LIMA-LAD, SEQ SVG- DIAG 1 -DIAG 2 (N/A) TRANSESOPHAGEAL ECHOCARDIOGRAM (TEE) (N/A)  Patient location during evaluation: SICU Anesthesia Type: General Level of consciousness: sedated Pain management: pain level controlled Vital Signs Assessment: post-procedure vital signs reviewed and stable Respiratory status: patient remains intubated per anesthesia plan Cardiovascular status: stable Anesthetic complications: no       Last Vitals:  Vitals:   01/04/17 0501 01/04/17 0544  BP:  (!) 125/46  Pulse: 62 60  Resp:  16  Temp:  36.7 C    Last Pain:  Vitals:   01/04/17 0544  TempSrc: Oral  PainSc:                  William Orr,William Orr

## 2017-01-04 NOTE — Brief Op Note (Addendum)
12/31/2016 - 01/04/2017  10:14 AM  PATIENT:  William Orr  71 y.o. male  PRE-OPERATIVE DIAGNOSIS:  CAD  POST-OPERATIVE DIAGNOSIS:  CAD  PROCEDURE:  Procedure(s):  CORONARY ARTERY BYPASS GRAFTING x 3 -LIMA to LAD -SEQ SVG to DIAGONAL 1 and DIAGONAL 2  ENDOSCOPIC HARVEST GREATER SAPHENOUS VEIN  -Right Leg  TRANSESOPHAGEAL ECHOCARDIOGRAM (TEE) (N/A)  SURGEON:    [name], MD  ASSISTANTS:  Ellwood Handler, PA-C  ANESTHESIA:   Roderic Palau, MD  CROSSCLAMP TIME:   68'  CARDIOPULMONARY BYPASS TIME: 65'  FINDINGS:  Normal LV systolic function  Good quality LIMA conduit for grafting  Good quality SVG conduit for grafting  Good quality target vessels for grafting  COMPLICATIONS: None  BASELINE WEIGHT: 108 kg  PATIENT DISPOSITION:   TO SICU IN STABLE CONDITION  [name], MD 01/04/2017 11:14 AM

## 2017-01-04 NOTE — Progress Notes (Signed)
[facility] BRIEF SICU PROGRESS NOTE  Day of Surgery  S/P Procedure(s) (LRB): CORONARY ARTERY BYPASS GRAFTING (CABG)X3, ON PUMP, USING LEFT INTERNAL MAMMARY ARTERY AND RIGHT GREATER SAPHENOUS VEIN HARVESTED ENDOSCOPICALLY, LIMA-LAD, SEQ SVG- DIAG 1 -DIAG 2 (N/A) TRANSESOPHAGEAL ECHOCARDIOGRAM (TEE) (N/A)   Extubated uneventfully and doing well Maintaining AAI paced rhythm with stable hemodynamics on IV NTG for hypertension Breathing comfortably w/ O2 sats 100% Chest tube output low UOP adequate Labs okay  Plan: Continue routine early postop  [name], MD 01/04/2017 6:56 PM

## 2017-01-04 NOTE — Op Note (Signed)
CARDIOTHORACIC SURGERY OPERATIVE NOTE  Date of Procedure: 01/04/2017  Preoperative Diagnosis:   Severe Coronary Artery Disease  Unstable Angina Pectoris  Postoperative Diagnosis: Same  Procedure:    Coronary Artery Bypass Grafting x 3   Left Internal Mammary Artery to Distal Left Anterior Descending Coronary Artery  Saphenous Vein Graft to First Diagonal Branch Coronary Artery  Sequential Saphenous Vein Graft to Second Diagonal Branch Coronary Artery  Endoscopic Vein Harvest from Right Thigh  Surgeon: Valentina Gu. [name], MD  Assistant: Ellwood Handler, PA-C  Anesthesia: Arabella Merles, MD  Operative Findings:  Normal LV systolic function  Good quality LIMA conduit for grafting  Good quality SVG conduit for grafting  Good quality target vessels for grafting    BRIEF CLINICAL NOTE AND INDICATIONS FOR SURGERY  Patient is a 71 year old male with no previous history of coronary artery disease but risk factors including history of hypertension, hyperlipidemia, type II diabetes mellitus, obesity, and obstructive sleep apnea on CPAP who has been referred for surgical consultation to discuss treatment options for management of complex single-vessel coronary artery disease with unstable angina pectoris.  Patient has been followed by [name] for several years and underwent a nuclear stress test in 2014 that was felt to be low risk. Echocardiogram in 2017 revealed normal left ventricular function with grade 2 diastolic dysfunction. Approximately 2 weeks ago the patient experienced an episode of substernal chest burning associated with exertion while he was working in the yard. Symptoms subsided. On 12/31/2016 the patient got up from his sleep with similar burning substernal chest pain and tightness that was more severe. Initially he suspected indigestion and he drank some baking soda. Symptoms initially began to subside but became worse, ultimately prompting him to present to the  emergency department at [facility]. Various symptoms of chest pain improved following morphine, GI cocktail, and nitroglycerin. Baseline EKG reveals sinus rhythm without acute changes. Troponin levels trended up from 0.04 on arrival to 0.07. The patient was transferred to [facility] and underwent diagnostic cardiac catheterization yesterday by Dr. Tamala Julian. He was found to have complex single-vessel coronary artery disease with long segment diffuse high-grade stenosis of the left anterior descending coronary artery with coronary anatomy felt to be relatively unfavorable for percutaneous coronary intervention. Cardiothoracic surgical consultation was requested.  The patient has been seen in consultation and counseled at length regarding the indications, risks and potential benefits of surgery.  All questions have been answered, and the patient provides full informed consent for the operation as described.    DETAILS OF THE OPERATIVE PROCEDURE  Preparation:  The patient is brought to the operating room on the above mentioned date and central monitoring was established by the anesthesia team including placement of Swan-Ganz catheter and radial arterial line. The patient is placed in the supine position on the operating table.  Intravenous antibiotics are administered. General endotracheal anesthesia is induced uneventfully. A Foley catheter is placed.  Baseline transesophageal echocardiogram was performed.  Findings were notable for normal LV systolic function.  The patient's chest, abdomen, both groins, and both lower extremities are prepared and draped in a sterile manner. A time out procedure is performed.   Surgical Approach and Conduit Harvest:  A median sternotomy incision was performed and the left internal mammary artery is dissected from the chest wall and prepared for bypass grafting. The left internal mammary artery is notably good quality conduit. Simultaneously, the  greater saphenous vein is obtained from the patient's right thigh using endoscopic vein  harvest technique. The saphenous vein is notably good quality conduit. After removal of the saphenous vein, the small surgical incisions in the lower extremity are closed with absorbable suture. Following systemic heparinization, the left internal mammary artery was transected distally noted to have excellent flow.   Extracorporeal Cardiopulmonary Bypass and Myocardial Protection:  The pericardium is opened. The ascending aorta is normal in appearance. The ascending aorta and the right atrium are cannulated for cardiopulmonary bypass.  Adequate heparinization is verified.     The entire pre-bypass portion of the operation was notable for stable hemodynamics.  Cardiopulmonary bypass was begun and the surface of the heart is inspected. Distal target vessels are selected for coronary artery bypass grafting. A cardioplegia cannula is placed in the ascending aorta.  A temperature probe was placed in the interventricular septum.  The patient is allowed to cool passively to [facility] systemic temperature.  The aortic cross clamp is applied and cold blood cardioplegia is delivered initially in an antegrade fashion through the aortic root..  Iced saline slush is applied for topical hypothermia.  The initial cardioplegic arrest is rapid with early diastolic arrest.  Repeat doses of cardioplegia are administered intermittently throughout the entire cross clamp portion of the operation through the aortic root and through subsequently placed vein grafts in order to maintain completely flat electrocardiogram and septal myocardial temperature below 15C.  Myocardial protection was felt to be excellent.  Coronary Artery Bypass Grafting:   The first diagonal branch of the left anterior descending coronary artery was grafted using a reversed saphenous vein graft in an side-to-side fashion.  At the site of distal anastomosis the target  vessel was good quality and measured approximately 1.8 mm in diameter.  The second diagonal branch of the left anterior descending coronary artery was grafted in and end-to-side fashion using a sequential saphenous vein graft off of the distal segment of the vein graft placed to the first diagonal branch coronary artery.  At the site of distal anastomosis the target vessel was good quality and measured approximately 1.5 mm in diameter.  The distal left anterior coronary artery was grafted with the left internal mammary artery in an end-to-side fashion.  At the site of distal anastomosis the target vessel was good quality and measured approximately 2.0 mm in diameter.  All proximal vein graft anastomoses were placed directly to the ascending aorta prior to removal of the aortic cross clamp.  The septal myocardial temperature rose rapidly after reperfusion of the left internal mammary artery graft.  The aortic cross clamp was removed after a total cross clamp time of 44 minutes.   Procedure Completion:  All proximal and distal coronary anastomoses were inspected for hemostasis and appropriate graft orientation. Epicardial pacing wires are fixed to the right ventricular outflow tract and to the right atrial appendage. The patient is rewarmed to 37C temperature. The patient is weaned and disconnected from cardiopulmonary bypass.  The patient's rhythm at separation from bypass was sinus.  The patient was weaned from cardiopulmonary bypass without any inotropic support. Total cardiopulmonary bypass time for the operation was 57 minutes.  Followup transesophageal echocardiogram performed after separation from bypass revealed no changes from the preoperative exam.  The aortic and venous cannula were removed uneventfully. Protamine was administered to reverse the anticoagulation. The mediastinum and pleural space were inspected for hemostasis and irrigated with saline solution. The mediastinum and the left  pleural space were drained using 3 chest tubes placed through separate stab incisions inferiorly.  The soft tissues  anterior to the aorta were reapproximated loosely. The sternum is closed with double strength sternal wire. The soft tissues anterior to the sternum were closed in multiple layers and the skin is closed with a running subcuticular skin closure.  The post-bypass portion of the operation was notable for stable rhythm and hemodynamics.  No blood products were administered during the operation.   Disposition:  The patient tolerated the procedure well and is transported to the surgical intensive care in stable condition. There are no intraoperative complications. All sponge instrument and needle counts are verified correct at completion of the operation.    Valentina Gu. [name] MD 01/04/2017 11:15 AM

## 2017-01-04 NOTE — Procedures (Signed)
Extubation Procedure Note  Patient Details:   Name: Obediah Welles DOB: 12-Sep-1945 MRN: [id number]   Airway Documentation:     Evaluation  O2 sats: stable throughout Complications: No apparent complications Patient did tolerate procedure well. Bilateral Breath Sounds: Clear, Diminished   Yes  4l/min [place] NIF -20 [id number]-662ml  Revonda Standard 01/04/2017, 4:55 PM

## 2017-01-05 ENCOUNTER — Inpatient Hospital Stay (HOSPITAL_COMMUNITY): Payer: Medicare Other

## 2017-01-05 ENCOUNTER — Encounter (HOSPITAL_COMMUNITY): Payer: Self-pay | Admitting: Thoracic Surgery (Cardiothoracic Vascular Surgery)

## 2017-01-05 DIAGNOSIS — Z951 Presence of aortocoronary bypass graft: Secondary | ICD-10-CM

## 2017-01-05 LAB — GLUCOSE, CAPILLARY
GLUCOSE-CAPILLARY: 102 mg/dL — AB (ref 65–99)
GLUCOSE-CAPILLARY: 104 mg/dL — AB (ref 65–99)
GLUCOSE-CAPILLARY: 112 mg/dL — AB (ref 65–99)
GLUCOSE-CAPILLARY: 115 mg/dL — AB (ref 65–99)
GLUCOSE-CAPILLARY: 126 mg/dL — AB (ref 65–99)
GLUCOSE-CAPILLARY: 133 mg/dL — AB (ref 65–99)
GLUCOSE-CAPILLARY: 195 mg/dL — AB (ref 65–99)
GLUCOSE-CAPILLARY: 98 mg/dL (ref 65–99)
Glucose-Capillary: 113 mg/dL — ABNORMAL HIGH (ref 65–99)
Glucose-Capillary: 106 mg/dL — ABNORMAL HIGH (ref 65–99)
Glucose-Capillary: 112 mg/dL — ABNORMAL HIGH (ref 65–99)
Glucose-Capillary: 113 mg/dL — ABNORMAL HIGH (ref 65–99)
Glucose-Capillary: 115 mg/dL — ABNORMAL HIGH (ref 65–99)
Glucose-Capillary: 131 mg/dL — ABNORMAL HIGH (ref 65–99)

## 2017-01-05 LAB — POCT I-STAT, CHEM 8
BUN: 12 mg/dL (ref 6–20)
CALCIUM ION: 1.21 mmol/L (ref 1.15–1.40)
CREATININE: 1 mg/dL (ref 0.61–1.24)
Chloride: 93 mmol/L — ABNORMAL LOW (ref 101–111)
GLUCOSE: 191 mg/dL — AB (ref 65–99)
HCT: 30 % — ABNORMAL LOW (ref 39.0–52.0)
HEMOGLOBIN: 10.2 g/dL — AB (ref 13.0–17.0)
Potassium: 3.8 mmol/L (ref 3.5–5.1)
Sodium: 133 mmol/L — ABNORMAL LOW (ref 135–145)
TCO2: 26 mmol/L (ref 0–100)

## 2017-01-05 LAB — MAGNESIUM
Magnesium: 2 mg/dL (ref 1.7–2.4)
Magnesium: 1.9 mg/dL (ref 1.7–2.4)

## 2017-01-05 LAB — BASIC METABOLIC PANEL
Anion gap: 6 (ref 5–15)
BUN: 9 mg/dL (ref 6–20)
CHLORIDE: 104 mmol/L (ref 101–111)
CO2: 25 mmol/L (ref 22–32)
CREATININE: 0.76 mg/dL (ref 0.61–1.24)
Calcium: 8.5 mg/dL — ABNORMAL LOW (ref 8.9–10.3)
GFR calc Af Amer: >60 mL/min (ref >60)
GFR calc non Af Amer: >60 mL/min (ref >60)
GLUCOSE: 98 mg/dL (ref 65–99)
Potassium: 4.2 mmol/L (ref 3.5–5.1)
Sodium: 135 mmol/L (ref 135–145)

## 2017-01-05 LAB — CBC
HCT: 30.2 % — ABNORMAL LOW (ref 39.0–52.0)
HEMATOCRIT: 31.4 % — AB (ref 39.0–52.0)
HEMOGLOBIN: 10 g/dL — AB (ref 13.0–17.0)
HEMOGLOBIN: 9.7 g/dL — AB (ref 13.0–17.0)
MCH: 29.4 pg (ref 26.0–34.0)
MCH: 29.6 pg (ref 26.0–34.0)
MCHC: 31.8 g/dL (ref 30.0–36.0)
MCHC: 32.1 g/dL (ref 30.0–36.0)
MCV: 92.4 fL (ref 78.0–100.0)
MCV: 92.1 fL (ref 78.0–100.0)
Platelets: 169 10*3/uL (ref 150–400)
Platelets: 162 10*3/uL (ref 150–400)
RBC: 3.4 MIL/uL — ABNORMAL LOW (ref 4.22–5.81)
RBC: 3.28 MIL/uL — AB (ref 4.22–5.81)
RDW: 13.8 % (ref 11.5–15.5)
RDW: 13.6 % (ref 11.5–15.5)
WBC: 8.3 10*3/uL (ref 4.0–10.5)
WBC: 8.4 10*3/uL (ref 4.0–10.5)

## 2017-01-05 LAB — MRSA CULTURE: Culture: NOT DETECTED

## 2017-01-05 LAB — CREATININE, SERUM
Creatinine, Ser: 1.02 mg/dL (ref 0.61–1.24)
GFR calc Af Amer: >60 mL/min (ref >60)
GFR calc non Af Amer: >60 mL/min (ref >60)

## 2017-01-05 MED ORDER — INSULIN ASPART 100 UNIT/ML ~~LOC~~ SOLN
0.0000 [IU] | SUBCUTANEOUS | Status: DC
  Administered 2017-01-05: 2 [IU] via SUBCUTANEOUS
  Administered 2017-01-05 – 2017-01-06 (×3): 4 [IU] via SUBCUTANEOUS
  Administered 2017-01-06 (×2): 2 [IU] via SUBCUTANEOUS
  Administered 2017-01-06 (×3): 4 [IU] via SUBCUTANEOUS
  Administered 2017-01-06 – 2017-01-07 (×5): 2 [IU] via SUBCUTANEOUS

## 2017-01-05 MED ORDER — ASPIRIN EC 81 MG PO TBEC
81.0000 mg | DELAYED_RELEASE_TABLET | Freq: Every day | ORAL | Status: DC
  Administered 2017-01-07 – 2017-01-10 (×4): 81 mg via ORAL
  Filled 2017-01-05 (×4): qty 1

## 2017-01-05 MED ORDER — INSULIN DETEMIR 100 UNIT/ML ~~LOC~~ SOLN
20.0000 [IU] | Freq: Once | SUBCUTANEOUS | Status: AC
  Administered 2017-01-05: 20 [IU] via SUBCUTANEOUS
  Filled 2017-01-05: qty 0.2

## 2017-01-05 MED ORDER — FA-PYRIDOXINE-CYANOCOBALAMIN 2.5-25-2 MG PO TABS
1.0000 | ORAL_TABLET | Freq: Every day | ORAL | Status: DC
  Administered 2017-01-07 – 2017-01-10 (×4): 1 via ORAL
  Filled 2017-01-05 (×4): qty 1

## 2017-01-05 MED ORDER — CARVEDILOL 12.5 MG PO TABS
12.5000 mg | ORAL_TABLET | Freq: Two times a day (BID) | ORAL | Status: DC
  Administered 2017-01-05 – 2017-01-06 (×3): 12.5 mg via ORAL
  Filled 2017-01-05 (×3): qty 1

## 2017-01-05 MED ORDER — ENOXAPARIN SODIUM 30 MG/0.3ML ~~LOC~~ SOLN
30.0000 mg | SUBCUTANEOUS | Status: DC
  Administered 2017-01-06 – 2017-01-09 (×4): 30 mg via SUBCUTANEOUS
  Filled 2017-01-05 (×4): qty 0.3

## 2017-01-05 MED ORDER — MORPHINE SULFATE (PF) 4 MG/ML IV SOLN
2.0000 mg | INTRAVENOUS | Status: DC | PRN
  Administered 2017-01-06: 2 mg via INTRAVENOUS
  Administered 2017-01-06: 4 mg via INTRAVENOUS
  Administered 2017-01-07: 2 mg via INTRAVENOUS
  Administered 2017-01-08: 4 mg via INTRAVENOUS
  Filled 2017-01-05 (×4): qty 1

## 2017-01-05 MED ORDER — DILTIAZEM HCL ER BEADS 120 MG PO CP24: 120.0000 mg | ORAL_CAPSULE | Freq: Every day | ORAL | Status: DC

## 2017-01-05 MED ORDER — DILTIAZEM HCL ER COATED BEADS 120 MG PO CP24
120.0000 mg | ORAL_CAPSULE | Freq: Every day | ORAL | Status: DC
  Administered 2017-01-05 – 2017-01-06 (×2): 120 mg via ORAL
  Filled 2017-01-05 (×2): qty 1

## 2017-01-05 MED ORDER — POLYSACCHARIDE IRON COMPLEX 150 MG PO CAPS
150.0000 mg | ORAL_CAPSULE | Freq: Every day | ORAL | Status: DC
  Administered 2017-01-07 – 2017-01-10 (×4): 150 mg via ORAL
  Filled 2017-01-05 (×4): qty 1

## 2017-01-05 MED ORDER — ASPIRIN EC 325 MG PO TBEC
325.0000 mg | DELAYED_RELEASE_TABLET | Freq: Every day | ORAL | Status: AC
  Administered 2017-01-05 – 2017-01-06 (×2): 325 mg via ORAL
  Filled 2017-01-05 (×2): qty 1

## 2017-01-05 MED ORDER — CLOPIDOGREL BISULFATE 75 MG PO TABS
75.0000 mg | ORAL_TABLET | Freq: Every day | ORAL | Status: DC
  Administered 2017-01-07 – 2017-01-10 (×4): 75 mg via ORAL
  Filled 2017-01-05 (×4): qty 1

## 2017-01-05 MED ORDER — INSULIN DETEMIR 100 UNIT/ML ~~LOC~~ SOLN
20.0000 [IU] | Freq: Every day | SUBCUTANEOUS | Status: DC
Start: 2017-01-06 — End: 2017-01-06
  Administered 2017-01-06: 20 [IU] via SUBCUTANEOUS
  Filled 2017-01-05: qty 0.2

## 2017-01-05 MED ORDER — KETOROLAC TROMETHAMINE 15 MG/ML IJ SOLN
15.0000 mg | Freq: Four times a day (QID) | INTRAMUSCULAR | Status: AC
  Administered 2017-01-05 – 2017-01-06 (×5): 15 mg via INTRAVENOUS
  Filled 2017-01-05 (×5): qty 1

## 2017-01-05 MED ORDER — FUROSEMIDE 10 MG/ML IJ SOLN
40.0000 mg | Freq: Once | INTRAMUSCULAR | Status: AC
  Administered 2017-01-05: 40 mg via INTRAVENOUS
  Filled 2017-01-05: qty 4

## 2017-01-05 NOTE — Discharge Instructions (Signed)
Coronary Artery Bypass Grafting, Care After ° °This sheet gives you information about how to care for yourself after your procedure. Your health care provider may also give you more specific instructions. If you have problems or questions, contact your health care provider. °What can I expect after the procedure? °After the procedure, it is common to have: °· Nausea and a lack of appetite. °· Constipation. °· Weakness and fatigue. °· Depression or irritability. °· Pain or discomfort in your incision areas. °Follow these instructions at home: °Medicines  °· Take over-the-counter and prescription medicines only as told by your health care provider. Do not stop taking medicines or start any new medicines without approval from your health care provider. °· If you were prescribed an antibiotic medicine, take it as told by your health care provider. Do not stop taking the antibiotic even if you start to feel better. °· Do not drive or use heavy machinery while taking prescription pain medicine. °Incision care  °· Follow instructions from your health care provider about how to take care of your incisions. Make sure you: °¨ Wash your hands with soap and water before you change your bandage (dressing). If soap and water are not available, use hand sanitizer. °¨ Change your dressing as told by your health care provider. °¨ Leave stitches (sutures), skin glue, or adhesive strips in place. These skin closures may need to stay in place for 2 weeks or longer. If adhesive strip edges start to loosen and curl up, you may trim the loose edges. Do not remove adhesive strips completely unless your health care provider tells you to do that. °· Keep incision areas clean, dry, and protected. °· Check your incision areas every day for signs of infection. Check for: °¨ More redness, swelling, or pain. °¨ More fluid or blood. °¨ Warmth. °¨ Pus or a bad smell. °· If incisions were made in your legs: °¨ Avoid crossing your legs. °¨ Avoid  sitting for long periods of time. Change positions every 30 minutes. °¨ Raise (elevate) your legs when you are sitting. °Bathing  °· Do not take baths, swim, or use a hot tub until your health care provider approves. °· Only take sponge baths. Pat the incisions dry. Do not rub incisions with a washcloth or towel. °· Ask your health care provider when you can shower. °Eating and drinking  °· Eat foods that are high in fiber, such as raw fruits and vegetables, whole grains, beans, and nuts. Meats should be lean cut. Avoid canned, processed, and fried foods. This can help prevent constipation and is a recommended part of a heart-healthy diet. °· Drink enough fluid to keep your urine clear or pale yellow. °· Limit alcohol intake to no more than 1 drink a day for nonpregnant women and 2 drinks a day for men. One drink equals 12 oz of beer, 5 oz of wine, or 1½ oz of hard liquor. °Activity  °· Rest and limit your activity as told by your health care provider. You may be instructed to: °¨ Stop any activity right away if you have chest pain, shortness of breath, irregular heartbeats, or dizziness. Get help right away if you have any of these symptoms. °¨ Move around frequently for short periods or take short walks as directed by your health care provider. Gradually increase your activities. You may need physical therapy or cardiac rehabilitation to help strengthen your muscles and build your endurance. °¨ Avoid lifting, pushing, or pulling anything that is heavier than 10   4.5 kg) for at least 6 weeks or as told by your health care provider.  Do not drive until your health care provider approves.  Ask your health care provider when you may return to work.  Ask your health care provider when you may resume sexual activity. General instructions   Do not use any products that contain nicotine or tobacco, such as cigarettes and e-cigarettes. If you need help quitting, ask your health care provider.  Take 2-3 deep  breaths every few hours during the day, while you recover. This helps expand your lungs and prevent complications like pneumonia after surgery.  If you were given a device called an incentive spirometer, use it several times a day to practice deep breathing. Support your chest with a pillow or your arms when you take deep breaths or cough.  Wear compression stockings as told by your health care provider. These stockings help to prevent blood clots and reduce swelling in your legs.  Weigh yourself every day. This helps identify if your body is holding (retaining) fluid that may make your heart and lungs work harder.  Keep all follow-up visits as told by your health care provider. This is important. Contact a health care provider if:  You have more redness, swelling, or pain around any incision.  You have more fluid or blood coming from any incision.  Any incision feels warm to the touch.  You have pus or a bad smell coming from any incision  You have a fever.  You have swelling in your ankles or legs.  You have pain in your legs.  You gain 2 lb (0.9 kg) or more a day.  You are nauseous or you vomit.  You have diarrhea. Get help right away if:  You have chest pain that spreads to your jaw or arms.  You are short of breath.  You have a fast or irregular heartbeat.  You notice a "clicking" in your breastbone (sternum) when you move.  You have numbness or weakness in your arms or legs.  You feel dizzy or light-headed. Summary  After the procedure, it is common to have pain or discomfort in the incision areas.  Do not take baths, swim, or use a hot tub until your health care provider approves.  Gradually increase your activities. You may need physical therapy or cardiac rehabilitation to help strengthen your muscles and build your endurance.  Weigh yourself every day. This helps identify if your body is holding (retaining) fluid that may make your heart and lungs work  harder. This information is not intended to replace advice given to you by your health care provider. Make sure you discuss any questions you have with your health care provider. Document Released: 02/17/2005 Document Revised: 06/19/2016 Document Reviewed: 06/19/2016 Elsevier Interactive Patient Education  2017 [employer].   Endoscopic Saphenous Vein Harvesting, Care After Refer to this sheet in the next few weeks. These instructions provide you with information about caring for yourself after your procedure. Your health care provider may also give you more specific instructions. Your treatment has been planned according to current medical practices, but problems sometimes occur. Call your health care provider if you have any problems or questions after your procedure. What can I expect after the procedure? After the procedure, it is common to have:  Pain.  Bruising.  Swelling.  Numbness. Follow these instructions at home: Medicine   Take over-the-counter and prescription medicines only as told by your health care provider.  Do not  drive or operate heavy machinery while taking prescription pain medicine. Incision care    Follow instructions from your health care provider about how to take care of the cut made during surgery (incision). Make sure you:  Wash your hands with soap and water before you change your bandage (dressing). If soap and water are not available, use hand sanitizer.  Change your dressing as told by your health care provider.  Leave stitches (sutures), skin glue, or adhesive strips in place. These skin closures may need to be in place for 2 weeks or longer. If adhesive strip edges start to loosen and curl up, you may trim the loose edges. Do not remove adhesive strips completely unless your health care provider tells you to do that.  Check your incision area every day for signs of infection. Check for:  More redness, swelling, or pain.  More fluid or  blood.  Warmth.  Pus or a bad smell. General instructions   Raise (elevate) your legs above the level of your heart while you are sitting or lying down.  Do any exercises your health care providers have given you. These may include deep breathing, coughing, and walking exercises.  Do not shower, take baths, swim, or use a hot tub unless told by your health care provider.  Wear your elastic stocking if told by your health care provider.  Keep all follow-up visits as told by your health care provider. This is important. Contact a health care provider if:  Medicine does not help your pain.  Your pain gets worse.  You have new leg bruises or your leg bruises get bigger.  You have a fever.  Your leg feels numb.  You have more redness, swelling, or pain around your incision.  You have more fluid or blood coming from your incision.  Your incision feels warm to the touch.  You have pus or a bad smell coming from your incision. Get help right away if:  Your pain is severe.  You develop pain, tenderness, warmth, redness, or swelling in any part of your leg.  You have chest pain.  You have trouble breathing. This information is not intended to replace advice given to you by your health care provider. Make sure you discuss any questions you have with your health care provider. Document Released: 04/12/2011 Document Revised: 01/06/2016 Document Reviewed: 06/14/2015 Elsevier Interactive Patient Education  2017 [employer].

## 2017-01-05 NOTE — Progress Notes (Addendum)
[address]       ,[place] [address]             [phone]        CARDIOTHORACIC SURGERY PROGRESS NOTE   R1 Day Post-Op Procedure(s) (LRB): CORONARY ARTERY BYPASS GRAFTING (CABG)X3, ON PUMP, USING LEFT INTERNAL MAMMARY ARTERY AND RIGHT GREATER SAPHENOUS VEIN HARVESTED ENDOSCOPICALLY, LIMA-LAD, SEQ SVG- DIAG 1 -DIAG 2 (N/A) TRANSESOPHAGEAL ECHOCARDIOGRAM (TEE) (N/A)  Subjective: Looks very good.  Mild soreness in chest.  Breathing comfortably.  No nausea.  Objective: Vital signs: BP Readings from Last 1 Encounters:  01/05/17 (!) 127/55   Pulse Readings from Last 1 Encounters:  01/05/17 76   Resp Readings from Last 1 Encounters:  01/05/17 20   Temp Readings from Last 1 Encounters:  01/05/17 99.5 F (37.5 C)    Hemodynamics: PAP: (17-46)/(7-29) 41/14 CO:  [3.4 L/min-6.4 L/min] 6 L/min CI:  [1.5 L/min/m2-2.8 L/min/m2] 2.6 L/min/m2  Physical Exam:  Rhythm:   sinus  Breath sounds: clear  Heart sounds:  RRR  Incisions:  Dressings dry, intact  Abdomen:  Soft, non-distended, non-tender  Extremities:  Warm, well-perfused  Chest tubes:  low volume thin serosanguinous output, no air leak    Intake/Output from previous day: 05/24 0701 - 05/25 0700 In: 6260.8 [I.V.:3909.8; Blood:246; NG/GT:30; IV [id number]:7673] Out: 5020 [Urine:3840; Blood:600; Chest Tube:580] Intake/Output this shift: No intake/output data recorded.  Lab Results:  CBC: Recent Labs  01/04/17 1757 01/04/17 1801 01/05/17 0400  WBC 9.0  --  8.3  HGB 10.2* 10.5* 10.0*  HCT 32.1* 31.0* 31.4*  PLT 177  --  169    BMET:  Recent Labs  01/03/17 1528  01/04/17 1801 01/05/17 0400  NA 137  < > 139 135  K 4.2  < > 4.4 4.2  CL 102  < > 104 104  CO2 24  --   --  25  GLUCOSE 137*  < > 123* 98  BUN 8  < > 10 9  CREATININE 0.95  < > 0.80 0.76  CALCIUM 9.9  --   --  8.5*  < > = values in this interval not displayed.   PT/INR:   Recent Labs  01/04/17 1200  LABPROT  16.6*  INR 1.33    CBG (last 3)   Recent Labs  01/05/17 0301 01/05/17 0400 01/05/17 0515  GLUCAP 104* 98 102*    ABG    Component Value Date/Time   PHART 7.358 01/04/2017 1743   PCO2ART 42.8 01/04/2017 1743   PO2ART 126.0 (H) 01/04/2017 1743   HCO3 24.1 01/04/2017 1743   TCO2 25 01/04/2017 1801   ACIDBASEDEF 1.0 01/04/2017 1743   O2SAT 99.0 01/04/2017 1743    CXR: Looks good.  Mild bibasilar atelectasis L>R  EKG: NSR w/out acute ischemic changes, frequent PVC's    Assessment/Plan: S/P Procedure(s) (LRB): CORONARY ARTERY BYPASS GRAFTING (CABG)X3, ON PUMP, USING LEFT INTERNAL MAMMARY ARTERY AND RIGHT GREATER SAPHENOUS VEIN HARVESTED ENDOSCOPICALLY, LIMA-LAD, SEQ SVG- DIAG 1 -DIAG 2 (N/A) TRANSESOPHAGEAL ECHOCARDIOGRAM (TEE) (N/A)  Doing well POD1 Maintaining NSR w/ stable hemodynamics on IV NTG for hypertension Breathing comfortably w/ O2 sats 92-100% on 2 L/min Expected post op acute blood loss anemia, Hgb 10.0 stable Expected post op volume excess, weight reportedly 11 lbs > immediately preop but stable from weight recorded at admission Expected post op atelectasis, mild Hypertension History of PVC's Type II diabetes mellitus, excellent glycemic control on insulin drip Hyperlipidemia   Mobilize  Diuresis  D/C lines  D/C tubes later today or tomorrow depending on output  Restart carvedilol at reduced dose and increase to preop dose as tolerated  Restart Cozaar 1-2 days depending on BP and renal function  Add levemir and wean insulin drip off  Restart oral agents later depending on oral intake  Full dose ASA for now then decrease to 81 mg/day and add Plavix in 2-3 days once chest tubes out  Resume Lipitor in 2-3 days  Start iron and folate 2-3 days  Possible transfer step-down later today or tomorrow   [name], MD 01/05/2017 7:01 AM

## 2017-01-05 NOTE — Progress Notes (Signed)
Patient ID: William Orr, male   DOB: Jan 05, 1946, 71 y.o.   MRN: [id number] EVENING ROUNDS NOTE :     [address]       [place] [address]             [phone]                 1 Day Post-Op Procedure(s) (LRB): CORONARY ARTERY BYPASS GRAFTING (CABG)X3, ON PUMP, USING LEFT INTERNAL MAMMARY ARTERY AND RIGHT GREATER SAPHENOUS VEIN HARVESTED ENDOSCOPICALLY, LIMA-LAD, SEQ SVG- DIAG 1 -DIAG 2 (N/A) TRANSESOPHAGEAL ECHOCARDIOGRAM (TEE) (N/A)  Total Length of Stay:  LOS: 5 days  BP 134/64   Pulse 83   Temp 98.5 F (36.9 C) (Oral)   Resp (!) 26   Ht 6\' 1"  (1.854 m)   Wt 248 lb 14.4 oz (112.9 kg)   SpO2 (!) 89%   BMI 32.84 kg/m   .Intake/Output      05/25 0701 - 05/26 0700   I.V. (mL/kg) 483.4 (4.3)   Blood    NG/GT    IV Piggyback 50   Total Intake(mL/kg) 533.4 (4.7)   Urine (mL/kg/hr) 1510 (1.1)   Emesis/NG output 0 (0)   Blood    Chest Tube 120 (0.1)   Total Output 1630   Net -1096.7       Emesis Occurrence 1 x     . sodium chloride    . cefUROXime (ZINACEF)  IV Stopped (01/05/17 1801)  . insulin (NOVOLIN-R) infusion Stopped (01/05/17 1200)  . lactated ringers 20 mL/hr at 01/05/17 0800  . lactated ringers Stopped (01/04/17 2300)  . nitroGLYCERIN Stopped (01/05/17 1826)     Lab Results  Component Value Date   WBC 8.4 01/05/2017   HGB 10.2 (L) 01/05/2017   HCT 30.0 (L) 01/05/2017   PLT 162 01/05/2017   GLUCOSE 191 (H) 01/05/2017   CHOL 116 01/01/2017   TRIG 413 (H) 01/01/2017   HDL 26 (L) 01/01/2017   LDLCALC UNABLE TO CALCULATE IF TRIGLYCERIDE OVER 400 mg/dL 01/01/2017   ALT 39 01/03/2017   AST 35 01/03/2017   NA 133 (L) 01/05/2017   K 3.8 01/05/2017   CL 93 (L) 01/05/2017   CREATININE 1.00 01/05/2017   BUN 12 01/05/2017   CO2 25 01/05/2017   TSH 2.850 08/27/2015   INR 1.33 01/04/2017   HGBA1C 7.2 (H) 01/03/2017   Started back on po Cardizem  Good response to lasix    Grace Isaac MD  Beeper [phone] Office  [phone] 01/05/2017 7:23 PM

## 2017-01-05 NOTE — Discharge Summary (Signed)
Physician Discharge Summary  Patient ID: William Orr MRN: [id number] DOB/AGE: [birth year] 70 y.o.  Admit date: 12/31/2016 Discharge date: 01/10/2017  Admission Diagnoses:  Patient Active Problem List   Diagnosis Date Noted  . Coronary artery disease involving native coronary artery of native heart with unstable angina pectoris ([place])   . Pure hypercholesterolemia   . Chest pain 12/31/2016  . Evaluate for OSA (obstructive sleep apnea) 10/19/2013  . Hiatal hernia 10/19/2013  . Atypical chest pain 09/15/2013  . DM2 (diabetes mellitus, type 2) ([place]) 09/15/2013  . HTN (hypertension) 09/15/2013  . Hyperlipidemia with target LDL less than 70 09/15/2013   Discharge Diagnoses:   Patient Active Problem List   Diagnosis Date Noted  . S/P CABG x 3 01/04/2017  . Coronary artery disease involving native coronary artery of native heart with unstable angina pectoris ([place])   . Pure hypercholesterolemia   . Chest pain 12/31/2016  . Evaluate for OSA (obstructive sleep apnea) 10/19/2013  . Hiatal hernia 10/19/2013  . Atypical chest pain 09/15/2013  . DM2 (diabetes mellitus, type 2) ([place]) 09/15/2013  . HTN (hypertension) 09/15/2013  . Hyperlipidemia with target LDL less than 70 09/15/2013   Discharged Condition: good  History of Present Illness:  [name]  is a 71 year old male with no previous history of coronary artery disease but risk factors including history of hypertension, hyperlipidemia, type II diabetes mellitus, obesity, and obstructive sleep apnea on CPAP who has been referred for surgical consultation to discuss treatment options for management of complex single-vessel coronary artery disease with unstable angina pectoris.  Patient has been followed by [name] for several years and underwent a nuclear stress test in 2014 that was felt to be low risk. Echocardiogram in 2017 revealed normal left ventricular function with grade 2 diastolic dysfunction. Approximately 2 weeks ago the  patient experienced an episode of substernal chest burning associated with exertion while he was working in the yard. Symptoms subsided. On 12/31/2016 the patient got up from his sleep with similar burning substernal chest pain and tightness that was more severe. Initially he suspected indigestion and he drank some baking soda. Symptoms initially began to subside but became worse, ultimately prompting him to present to the emergency department at [facility]. Various symptoms of chest pain improved following morphine, GI cocktail, and nitroglycerin. Baseline EKG reveals sinus rhythm without acute changes. Troponin levels trended up from 0.04 on arrival to 0.07. The patient was transferred to [facility] and underwent diagnostic cardiac catheterization yesterday by Dr. Tamala Julian. He was found to have complex single-vessel coronary artery disease with long segment diffuse high-grade stenosis of the left anterior descending coronary artery with coronary anatomy felt to be relatively unfavorable for percutaneous coronary intervention. Cardiothoracic surgical consultation was requested.  Hospital Course:   Dr. [name] evaluated the patient and was in agreement the patient would benefit from coronary bypass grafting.  The risks and benefits of the procedure were explained to the patient and he was agreeable to proceed.  He was taken to the operating room and underwent CABG x 3 utilizing LIMA to LAD, SVG to Diagonal 1, and SVG to Diagonal 2.  He also underwent endoscopic harvest of greater saphenous vein from his right thigh.  He tolerated the procedure without difficulty and was taken to the SICU in stable condition.  He was extubated the evening of surgery.  During his stay in the SICU the patient was weaned off NTG drip as tolerated.  His chest tubes and arterial lines  were removed without difficulty.  He was restarted on his home Coreg for HR and BP control.  He continued to be hypertensive and his  home Cozaar was restarted.  He developed right sided pneumothorax, requiring chest tube placement.  The patient tolerated this without difficulty.  Follow up CXR showed resolution of pneumothorax.  His CXR remained stable and his chest tube was transitioned to water seal on POD #4.  He was maintaining NSR and felt medically stable for transfer to the telemetry unit.  The patient continued to progress.  His chest tube remained free from air leak.  CXR remained stable and chest tube was removed on POD #5.  His Cozaar was titrated for HTN.  He continues to maintain NSR and his pacing wires were removed without difficulty.  He was treated with lactulose for constipation.  He was ambulating without difficulty.  He is felt medically stable for discharge home today.               Significant Diagnostic Studies: angiography:    Unstable angina pectoris/non-ST elevation MI  Multifocal high-grade disease involving the proximal and mid LAD (tandem lesions). The LAD gives origin to 2 large diagonals, the first of which contains ostial to proximal 80-85% stenosis and forms a [place] 011 bifurcation lesion with the proximal LAD. The second larger diagonal contains 50-70% mid stenosis.  Circumflex is widely patent without significant stenosis.  Right coronary is dominant with a large PDA which contains luminal irregularities but no high-grade obstruction.  Normal left ventricular function. Left ventricular end-diastolic pressure is upper normal.  RECOMMENDATION:   CABG with LIMA to LAD and diagonals versus higher risk multilesion PCI. Have notified [name] (his primary cardiologist) and also asked for TCTS consult.  Treatments: surgery:    Coronary Artery Bypass Grafting x 3              Left Internal Mammary Artery to Distal Left Anterior Descending Coronary Artery             Saphenous Vein Graft to First Diagonal Branch Coronary Artery             Sequential Saphenous Vein Graft to Second Diagonal  Branch Coronary Artery             Endoscopic Vein Harvest from Right Thigh  Disposition: Home  Discharge Medications:  The patient has been discharged on:   1.Beta Blocker:  Yes [ x  ]                              No   [   ]                              If No, reason:  2.Ace Inhibitor/ARB: Yes [  x ]                                     No  [    ]                                     If No, reason:  3.Statin:   Yes [ x  ]  No  [   ]                  If No, reason:  4.Ecasa:  Yes  [ x  ]                  No   [   ]                  If No, reason:     Allergies as of 01/10/2017      Reactions   Lisinopril Cough      Medication List    STOP taking these medications   ALEVE 220 MG tablet Generic drug:  naproxen sodium   Magnesium 100 MG Tabs     TAKE these medications   acetaminophen 325 MG tablet Commonly known as:  TYLENOL Take 2 tablets (650 mg total) by mouth every 6 (six) hours as needed for mild pain or moderate pain.   Alpha-Lipoic Acid 600 MG Caps Take 600 mg by mouth daily.   aspirin 81 MG EC tablet Take 1 tablet (81 mg total) by mouth daily.   carvedilol 25 MG tablet Commonly known as:  COREG Take 25 mg by mouth 2 (two) times daily.   clopidogrel 75 MG tablet Commonly known as:  PLAVIX Take 1 tablet (75 mg total) by mouth daily.   diclofenac sodium 1 % Gel Commonly known as:  VOLTAREN Apply 1 application topically 4 (four) times daily as needed (pain).   diltiazem 300 MG 24 hr capsule Commonly known as:  TIAZAC Take 300 mg by mouth at bedtime.   fluticasone 50 MCG/ACT nasal spray Commonly known as:  FLONASE Place 1 spray into both nostrils daily as needed (seasonal allergies).   glipiZIDE 10 MG tablet Commonly known as:  GLUCOTROL Take 15 mg by mouth 2 (two) times daily.   losartan 100 MG tablet Commonly known as:  COZAAR Take 100 mg by mouth daily.   metFORMIN 850 MG tablet Commonly known as:  GLUCOPHAGE Take 850  mg by mouth 2 (two) times daily with a meal.   multivitamin with minerals Tabs tablet Take 2 tablets by mouth daily.   OVER THE COUNTER MEDICATION Take 1 tablet by mouth 2 (two) times daily. Super Beta Prostate   OVER THE COUNTER MEDICATION Take 2 capsules by mouth daily. Krill Oil with CoQ10   ranitidine 150 MG tablet Commonly known as:  ZANTAC Take 150 mg by mouth daily before breakfast.   rosuvastatin 20 MG tablet Commonly known as:  CRESTOR Take 1 tablet (20 mg total) by mouth daily at 6 PM.   sodium chloride 0.65 % Soln nasal spray Commonly known as:  OCEAN Place 1 spray into both nostrils 2 (two) times daily as needed for congestion.   traMADol 50 MG tablet Commonly known as:  ULTRAM Take 1-2 tablets (50-100 mg total) by mouth every 4 (four) hours as needed for moderate pain.   Vitamin D-3 5000 units Tabs Take 10,000 Units by mouth daily.            Durable Medical Equipment        Start     Ordered   01/09/17 1032  For home use only DME Walker rolling  Once    Question:  Patient needs a walker to treat with the following condition  Answer:  Coronary arteriosclerosis after coronary artery bypass grafting   01/09/17 [place]     Follow-up Information    [name],  Valentina Gu, MD Follow up on 01/05/2017.   Specialty:  Cardiothoracic Surgery Why:  Appointment is at 11:00, please get CXR at 10:30 at [facility] located on first floor of our office building  Contact information: [place] [address] [phone]        Almyra Deforest, [place]. Go on 01/18/2017.   Specialties:  Cardiology, Radiology Why:  @ 8am Contact information: [address] [place] [address] [phone]           Signed: Ellwood Handler 01/10/2017, 8:04 AM

## 2017-01-05 NOTE — Care Management Note (Signed)
Case Management Note Previous CM note initiated by Bethena Roys, RN--01/01/2017, 10:20 AM    Patient Details  Name: William Orr MRN: [id number] Date of Birth: April 15, 1946  Subjective/Objective: Pt presented as a transfer from [facility] for Chest Pain. PTA pt was independent from home with wife. Plan will be to return home once stable.                    Action/Plan: CM to fax new Medicare Cards to Admitting. No Home Needs identified by CM at this time.   Expected Discharge Date:                  Expected Discharge Plan:  Home/Self Care  In-House Referral:  NA  Discharge planning Services  NA  Post Acute Care Choice:  NA Choice offered to:  NA  DME Arranged:  N/A DME Agency:  NA  HH Arranged:  NA HH Agency:  NA  Status of Service:  Completed, signed off  If discussed at [place] of Stay Meetings, dates discussed:    Discharge Disposition:   Additional Comments:  01/05/17- 1100- Marvetta Gibbons RN, CM-  Pt s/p CABGx3 on 01/04/17- post op tx to [place], Romeo Rabon, RN 01/05/2017, 11:09 AM [phone]

## 2017-01-05 NOTE — Progress Notes (Signed)
Notified Dr. Servando Snare pts bp is still high on 60 of nitro and urine output has been low for the past few hours. MD will place orders. Will continue to monitor closely.

## 2017-01-06 ENCOUNTER — Inpatient Hospital Stay (HOSPITAL_COMMUNITY): Payer: Medicare Other

## 2017-01-06 LAB — GLUCOSE, CAPILLARY
GLUCOSE-CAPILLARY: 162 mg/dL — AB (ref 65–99)
GLUCOSE-CAPILLARY: 170 mg/dL — AB (ref 65–99)
GLUCOSE-CAPILLARY: 164 mg/dL — AB (ref 65–99)
GLUCOSE-CAPILLARY: 153 mg/dL — AB (ref 65–99)
GLUCOSE-CAPILLARY: 184 mg/dL — AB (ref 65–99)
Glucose-Capillary: 158 mg/dL — ABNORMAL HIGH (ref 65–99)

## 2017-01-06 LAB — CBC
HCT: 28.1 % — ABNORMAL LOW (ref 39.0–52.0)
HEMOGLOBIN: 8.9 g/dL — AB (ref 13.0–17.0)
MCH: 29.6 pg (ref 26.0–34.0)
MCHC: 31.7 g/dL (ref 30.0–36.0)
MCV: 93.4 fL (ref 78.0–100.0)
PLATELETS: 156 10*3/uL (ref 150–400)
RBC: 3.01 MIL/uL — ABNORMAL LOW (ref 4.22–5.81)
RDW: 13.7 % (ref 11.5–15.5)
WBC: 8.8 10*3/uL (ref 4.0–10.5)

## 2017-01-06 LAB — BASIC METABOLIC PANEL
ANION GAP: 8 (ref 5–15)
BUN: 11 mg/dL (ref 6–20)
CALCIUM: 8.4 mg/dL — AB (ref 8.9–10.3)
CO2: 27 mmol/L (ref 22–32)
Chloride: 99 mmol/L — ABNORMAL LOW (ref 101–111)
Creatinine, Ser: 0.95 mg/dL (ref 0.61–1.24)
GFR calc Af Amer: >60 mL/min (ref >60)
GFR calc non Af Amer: >60 mL/min (ref >60)
GLUCOSE: 156 mg/dL — AB (ref 65–99)
Potassium: 4.1 mmol/L (ref 3.5–5.1)
Sodium: 134 mmol/L — ABNORMAL LOW (ref 135–145)

## 2017-01-06 MED ORDER — LOSARTAN POTASSIUM 50 MG PO TABS
50.0000 mg | ORAL_TABLET | Freq: Every day | ORAL | Status: DC
  Administered 2017-01-06 – 2017-01-08 (×3): 50 mg via ORAL
  Filled 2017-01-06 (×3): qty 1

## 2017-01-06 MED ORDER — METOCLOPRAMIDE HCL 5 MG/ML IJ SOLN
5.0000 mg | Freq: Four times a day (QID) | INTRAMUSCULAR | Status: AC
  Administered 2017-01-06 – 2017-01-07 (×4): 5 mg via INTRAVENOUS
  Filled 2017-01-06 (×4): qty 2

## 2017-01-06 MED ORDER — DILTIAZEM HCL ER COATED BEADS 180 MG PO CP24
300.0000 mg | ORAL_CAPSULE | Freq: Every day | ORAL | Status: DC
  Administered 2017-01-07 – 2017-01-08 (×2): 300 mg via ORAL
  Filled 2017-01-06 (×2): qty 1

## 2017-01-06 MED ORDER — ALUM HYDROXIDE-MAG CARBONATE 95-358 MG/15ML PO SUSP
15.0000 mL | ORAL | Status: DC | PRN
  Administered 2017-01-06: 15 mL via ORAL
  Filled 2017-01-06 (×2): qty 15

## 2017-01-06 MED ORDER — CARVEDILOL 25 MG PO TABS
25.0000 mg | ORAL_TABLET | Freq: Two times a day (BID) | ORAL | Status: DC
  Administered 2017-01-06 – 2017-01-08 (×5): 25 mg via ORAL
  Filled 2017-01-06 (×5): qty 1

## 2017-01-06 MED ORDER — INSULIN DETEMIR 100 UNIT/ML ~~LOC~~ SOLN
25.0000 [IU] | Freq: Every day | SUBCUTANEOUS | Status: DC
  Administered 2017-01-07 – 2017-01-08 (×2): 25 [IU] via SUBCUTANEOUS
  Filled 2017-01-06 (×3): qty 0.25

## 2017-01-06 NOTE — Progress Notes (Signed)
Patient ID: William Orr, male   DOB: Jan 27, 1946, 71 y.o.   MRN: [id number] .      [address]       [place] [address]             [phone]        CARDIOTHORACIC SURGERY PROGRESS NOTE   R1 Day Post-Op Procedure(s) (LRB): CORONARY ARTERY BYPASS GRAFTING (CABG)X3, ON PUMP, USING LEFT INTERNAL MAMMARY ARTERY AND RIGHT GREATER SAPHENOUS VEIN HARVESTED ENDOSCOPICALLY, LIMA-LAD, SEQ SVG- DIAG 1 -DIAG 2 (N/A) TRANSESOPHAGEAL ECHOCARDIOGRAM (TEE) (N/A)  Subjective: Up in chair , bp up some, complaint of gas and nausea this am, could not eat breakfast  Objective: Vital signs: BP Readings from Last 1 Encounters:  01/06/17 (!) 149/75   Pulse Readings from Last 1 Encounters:  01/06/17 79   Resp Readings from Last 1 Encounters:  01/06/17 18   Temp Readings from Last 1 Encounters:  01/06/17 99.4 F (37.4 C) (Oral)    Hemodynamics:    Physical Exam:  Rhythm:   sinus  Breath sounds: clear  Heart sounds:  RRR  Incisions:  Dressings dry, intact  Abdomen:  Soft, mildly distended, hypoactive bowel sounds   Extremities:  Warm, well-perfused  Chest tubes:   no air leak    Intake/Output from previous day: 05/25 0701 - 05/26 0700 In: 773.4 [I.V.:723.4; IV Piggyback:50] Out: 2119 [Urine:3510; Chest Tube:240] Intake/Output this shift: Total I/O In: 190 [P.O.:120; I.V.:20; IV Piggyback:50] Out: 70 [Urine:70]  Lab Results:  CBC:  Recent Labs  01/05/17 1614 01/05/17 1621 01/06/17 0345  WBC 8.4  --  8.8  HGB 9.7* 10.2* 8.9*  HCT 30.2* 30.0* 28.1*  PLT 162  --  156    BMET:  Recent Labs  01/05/17 0400  01/05/17 1621 01/06/17 0345  NA 135  --  133* 134*  K 4.2  --  3.8 4.1  CL 104  --  93* 99*  CO2 25  --   --  27  GLUCOSE 98  --  191* 156*  BUN 9  --  12 11  CREATININE 0.76  < > 1.00 0.95  CALCIUM 8.5*  --   --  8.4*  < > = values in this interval not displayed.   PT/INR:    Recent Labs  01/04/17 1200  LABPROT 16.6*  INR 1.33     CBG (last 3)   Recent Labs  01/06/17 0006 01/06/17 0329 01/06/17 0757  GLUCAP 158* 162* 170*    ABG    Component Value Date/Time   PHART 7.358 01/04/2017 1743   PCO2ART 42.8 01/04/2017 1743   PO2ART 126.0 (H) 01/04/2017 1743   HCO3 24.1 01/04/2017 1743   TCO2 26 01/05/2017 1621   ACIDBASEDEF 1.0 01/04/2017 1743   O2SAT 99.0 01/04/2017 1743    CXR:  EKG:  PVC's mostly sinus    Assessment/Plan: S/P Procedure(s) (LRB): CORONARY ARTERY BYPASS GRAFTING (CABG)X3, ON PUMP, USING LEFT INTERNAL MAMMARY ARTERY AND RIGHT GREATER SAPHENOUS VEIN HARVESTED ENDOSCOPICALLY, LIMA-LAD, SEQ SVG- DIAG 1 -DIAG 2 (N/A) TRANSESOPHAGEAL ECHOCARDIOGRAM (TEE) (N/A)  Doing well POD2  Maintaining NSR w/ stable hemodynamics back on beta blocker and Cardizem   Breathing comfortably w/ O2 sats 92-100% on 2 L/min Expected post op acute blood loss anemia, Hgb 8.9 Expected post op volume excess,responded to lasix yesterday  Expected post op atelectasis, mild Hypertension History of PVC's Type II diabetes mellitus,  glycemic control on levimir  20 u daily  Hyperlipidemia   Mobilize  Diuresis  D/C tubes  today   Carvedilol restarted  at reduced dose   Restart Cozaar 1-2 days depending on BP and renal function  Restart oral agents when taking po  Better    Full dose ASA for now then decrease to 81 mg/day and add Plavix in 2-3 days once chest tubes out  Resume Lipitor in 2-3 days  Start iron and folate 2-3 days when taking po     Grace Isaac, MD 01/06/2017 9:25 AM

## 2017-01-06 NOTE — Plan of Care (Signed)
Problem: Activity: Goal: Risk for activity intolerance will decrease Outcome: Progressing Ambulate 4x  Problem: Bowel/Gastric: Goal: Gastrointestinal status for postoperative course will improve Outcome: Not Progressing Nausea today with emesis; diet changed back to clear liquid; passing gas  Problem: Nutritional: Goal: Risk for body nutrition deficit will decrease Outcome: Not Progressing Diet back to clear liquid

## 2017-01-06 NOTE — Plan of Care (Signed)
Problem: Pain Managment: Goal: General experience of comfort will improve Outcome: Progressing Pain well managed with toradol  Problem: Activity: Goal: Risk for activity intolerance will decrease Outcome: Progressing Ambulate 2x hallway

## 2017-01-06 NOTE — Progress Notes (Signed)
Patient ID: Quame Spratlin, male   DOB: October 14, 1945, 71 y.o.   MRN: [id number] EVENING ROUNDS NOTE :     [address]       [place] [address]             [phone]                 2 Days Post-Op Procedure(s) (LRB): CORONARY ARTERY BYPASS GRAFTING (CABG)X3, ON PUMP, USING LEFT INTERNAL MAMMARY ARTERY AND RIGHT GREATER SAPHENOUS VEIN HARVESTED ENDOSCOPICALLY, LIMA-LAD, SEQ SVG- DIAG 1 -DIAG 2 (N/A) TRANSESOPHAGEAL ECHOCARDIOGRAM (TEE) (N/A)  Total Length of Stay:  LOS: 6 days  BP (!) 154/60   Pulse 75   Temp 99.6 F (37.6 C) (Oral)   Resp (!) 22   Ht 6\' 1"  (1.854 m)   Wt 246 lb 4.8 oz (111.7 kg)   SpO2 97%   BMI 32.50 kg/m   .Intake/Output      05/26 0701 - 05/27 0700   P.O. 900   I.V. (mL/kg) 240 (2.1)   IV Piggyback 50   Total Intake(mL/kg) 1190 (10.7)   Urine (mL/kg/hr) 1120 (0.8)   Emesis/NG output 0 (0)   Chest Tube 0 (0)   Total Output 1120   Net +70       Emesis Occurrence 1 x     . sodium chloride    . insulin (NOVOLIN-R) infusion Stopped (01/05/17 1200)  . lactated ringers 20 mL/hr at 01/06/17 1900  . lactated ringers Stopped (01/04/17 2300)  . nitroGLYCERIN Stopped (01/05/17 1826)     Lab Results  Component Value Date   WBC 8.8 01/06/2017   HGB 8.9 (L) 01/06/2017   HCT 28.1 (L) 01/06/2017   PLT 156 01/06/2017   GLUCOSE 156 (H) 01/06/2017   CHOL 116 01/01/2017   TRIG 413 (H) 01/01/2017   HDL 26 (L) 01/01/2017   LDLCALC UNABLE TO CALCULATE IF TRIGLYCERIDE OVER 400 mg/dL 01/01/2017   ALT 39 01/03/2017   AST 35 01/03/2017   NA 134 (L) 01/06/2017   K 4.1 01/06/2017   CL 99 (L) 01/06/2017   CREATININE 0.95 01/06/2017   BUN 11 01/06/2017   CO2 27 01/06/2017   TSH 2.850 08/27/2015   INR 1.33 01/04/2017   HGBA1C 7.2 (H) 01/03/2017   Nausea better, taking clear liquids , abdomen less distended, bowel sounds present , resuming bp meds   Grace Isaac MD  [address] [phone] Office [phone] 01/06/2017 7:49 PM

## 2017-01-06 NOTE — Progress Notes (Addendum)
Progress Note  Patient Name: William Orr Date of Encounter: 01/06/2017  Primary Cardiologist: [place], M.D.   Subjective   Post CABG for unstable angina. No angina quality chest pain since surgery. Sitting at bedside watching television.  Inpatient Medications    Scheduled Meds: . acetaminophen  1,000 mg Oral Q6H  . aspirin EC  325 mg Oral Daily  . [START ON 01/07/2017] aspirin EC  81 mg Oral Daily  . [START ON 01/07/2017] atorvastatin  80 mg Oral q1800  . bisacodyl  10 mg Oral Daily   Or  . bisacodyl  10 mg Rectal Daily  . carvedilol  12.5 mg Oral BID WC  . [START ON 01/07/2017] clopidogrel  75 mg Oral Daily  . diltiazem  120 mg Oral Daily  . docusate sodium  200 mg Oral Daily  . enoxaparin (LOVENOX) injection  30 mg Subcutaneous Q24H  . [START ON 5/88/5027] folic acid-pyridoxine-cyancobalamin  1 tablet Oral Daily  . insulin aspart  0-24 Units Subcutaneous Q4H  . insulin detemir  20 Units Subcutaneous Daily  . [START ON 01/07/2017] iron polysaccharides  150 mg Oral Daily  . metoCLOPramide (REGLAN) injection  5 mg Intravenous Q6H  . pantoprazole  40 mg Oral Daily  . sodium chloride flush  3 mL Intravenous Q12H   Continuous Infusions: . sodium chloride    . insulin (NOVOLIN-R) infusion Stopped (01/05/17 1200)  . lactated ringers 20 mL/hr at 01/06/17 1000  . lactated ringers Stopped (01/04/17 2300)  . nitroGLYCERIN Stopped (01/05/17 1826)   PRN Meds: aluminum hydroxide-magnesium carbonate, labetalol, metoprolol tartrate, morphine injection, ondansetron (ZOFRAN) IV, oxyCODONE, sodium chloride flush, traMADol   Vital Signs    Vitals:   01/06/17 0758 01/06/17 0800 01/06/17 0922 01/06/17 1000  BP:  (!) 144/65 (!) 149/75 (!) 155/64  Pulse:  76 79 74  Resp:  15 18 18   Temp: 99.4 F (37.4 C)     TempSrc: Oral     SpO2:  98% 97% 97%  Weight:      Height:        Intake/Output Summary (Last 24 hours) at 01/06/17 1021 Last data filed at 01/06/17 1000  Gross  per 24 hour  Intake           898.55 ml  Output             3660 ml  Net         -2761.45 ml   Filed Weights   01/05/17 0530 01/06/17 0300 01/06/17 0600  Weight: 248 lb 14.4 oz (112.9 kg) 248 lb 10.9 oz (112.8 kg) 246 lb 4.8 oz (111.7 kg)    Telemetry    Normal sinus rhythm - Personally Reviewed  ECG    No new tracing today - Personally Reviewed  Physical Exam  Obese and in no distress GEN: No acute distress.   Neck: No JVD Cardiac: RRR, no murmurs, rubs, or gallops.  Respiratory: Clear to auscultation bilaterally. GI: Soft, nontender, non-distended  MS: No edema; No deformity. Neuro:  Nonfocal  Psych: Normal affect   Labs    Chemistry Recent Labs Lab 01/03/17 1528  01/05/17 0400 01/05/17 1614 01/05/17 1621 01/06/17 0345  NA 137  < > 135  --  133* 134*  K 4.2  < > 4.2  --  3.8 4.1  CL 102  < > 104  --  93* 99*  CO2 24  --  25  --   --  27  GLUCOSE 137*  < >  98  --  191* 156*  BUN 8  < > 9  --  12 11  CREATININE 0.95  < > 0.76 1.02 1.00 0.95  CALCIUM 9.9  --  8.5*  --   --  8.4*  PROT 7.6  --   --   --   --   --   ALBUMIN 4.0  --   --   --   --   --   AST 35  --   --   --   --   --   ALT 39  --   --   --   --   --   ALKPHOS 68  --   --   --   --   --   BILITOT 0.7  --   --   --   --   --   GFRNONAA >60  < > >60 >60  --  >60  GFRAA >60  < > >60 >60  --  >60  ANIONGAP 11  --  6  --   --  8  < > = values in this interval not displayed.   Hematology Recent Labs Lab 01/05/17 0400 01/05/17 1614 01/05/17 1621 01/06/17 0345  WBC 8.3 8.4  --  8.8  RBC 3.40* 3.28*  --  3.01*  HGB 10.0* 9.7* 10.2* 8.9*  HCT 31.4* 30.2* 30.0* 28.1*  MCV 92.4 92.1  --  93.4  MCH 29.4 29.6  --  29.6  MCHC 31.8 32.1  --  31.7  RDW 13.8 13.6  --  13.7  PLT 169 162  --  156    Cardiac Enzymes Recent Labs Lab 12/31/16 1747 01/01/17 0042 01/01/17 0752  TROPONINI 1.03* 0.85* 0.53*   No results for input(s): TROPIPOC in the last 168 hours.   BNPNo results for input(s):  BNP, PROBNP in the last 168 hours.   DDimer No results for input(s): DDIMER in the last 168 hours.   Radiology    Dg Chest Port 1 View  Result Date: 01/06/2017 CLINICAL DATA:  Atelectasis. EXAM: PORTABLE CHEST 1 VIEW COMPARISON:  Radiographs of Jan 05, 2017. FINDINGS: Stable cardiomegaly. Sternotomy wires are noted. No definite pneumothorax is noted. Right internal jugular venous sheath is noted. Left-sided chest tube is unchanged in position. Stable mild bibasilar subsegmental atelectasis. Status post coronary artery bypass graft. Bony thorax is unremarkable. IMPRESSION: Left-sided chest tube is unchanged in position without pneumothorax. Mild bibasilar subsegmental atelectasis. Electronically Signed   By: Marijo Conception, M.D.   On: 01/06/2017 07:33   Dg Chest Port 1 View  Result Date: 01/05/2017 CLINICAL DATA:  Chest tube, CABG EXAM: PORTABLE CHEST 1 VIEW COMPARISON:  01/04/2017 FINDINGS: Left basilar chest tube remains in place without pneumothorax. Interval removal of the NG tube, endotracheal tube and Swan-Ganz catheter. Bibasilar opacities compatible with atelectasis. Mild vascular congestion. IMPRESSION: No pneumothorax.  Interval extubation.  Bibasilar atelectasis. Electronically Signed   By: Rolm Baptise M.D.   On: 01/05/2017 07:48   Dg Chest Port 1 View  Result Date: 01/04/2017 CLINICAL DATA:  CABG. EXAM: PORTABLE CHEST 1 VIEW COMPARISON:  01/03/2017. FINDINGS: Endotracheal tube 4.5 cm above the carina. NG tube tip below left hemidiaphragm. Side hole is just below the gastroesophageal junction. Advancement of approximately 8 cm should be considered. Swan-Ganz catheter tip projected over the right main pulmonary artery. Mediastinal drainage catheters noted over the mediastinum. Left chest tube noted with its tip over the left lower chest. No pneumothorax.  Prior CABG. Stable cardiomegaly. Low lung volumes with mild bibasilar atelectasis. No prominent pleural effusion. No acute bony  abnormality. IMPRESSION: 1.  Lines and tubes as above.  No pneumothorax. 2. Prior CABG. Stable cardiomegaly. Low lung volumes with mild basilar atelectasis. Electronically Signed   By: Marcello Moores  Register   On: 01/04/2017 12:41    Cardiac Studies   No new data  Patient Profile     71 y.o. male diabetic with history of PVCs admitted with progressive angina and found to have complex LAD disease. Underwent successful CABG with LIMA to LAD and SVG to the first and second diagonals.  Assessment & Plan    1. Stable status post CABG for complex LAD disease. No recurrent ischemia. 2. No arrhythmia or evidence of pericardial Inflammation at this point.  Plan to continue to follow. No specific recommendations.  Signed, Sinclair Grooms, MD  01/06/2017, 10:21 AM

## 2017-01-06 NOTE — Progress Notes (Signed)
Dr. Servando Snare called in regards to patients SBP 160-170s. PRN medications have been administered. New orders given to restart losartan. Will administer and continue to monitor.

## 2017-01-07 ENCOUNTER — Inpatient Hospital Stay (HOSPITAL_COMMUNITY): Payer: Medicare Other

## 2017-01-07 LAB — GLUCOSE, CAPILLARY
GLUCOSE-CAPILLARY: 121 mg/dL — AB (ref 65–99)
GLUCOSE-CAPILLARY: 152 mg/dL — AB (ref 65–99)
Glucose-Capillary: 140 mg/dL — ABNORMAL HIGH (ref 65–99)
Glucose-Capillary: 146 mg/dL — ABNORMAL HIGH (ref 65–99)
Glucose-Capillary: 148 mg/dL — ABNORMAL HIGH (ref 65–99)
Glucose-Capillary: 180 mg/dL — ABNORMAL HIGH (ref 65–99)

## 2017-01-07 LAB — BASIC METABOLIC PANEL
Anion gap: 4 — ABNORMAL LOW (ref 5–15)
BUN: 14 mg/dL (ref 6–20)
CO2: 27 mmol/L (ref 22–32)
Calcium: 8.3 mg/dL — ABNORMAL LOW (ref 8.9–10.3)
Chloride: 100 mmol/L — ABNORMAL LOW (ref 101–111)
Creatinine, Ser: 0.87 mg/dL (ref 0.61–1.24)
GFR calc Af Amer: >60 mL/min (ref >60)
GFR calc non Af Amer: >60 mL/min (ref >60)
Glucose, Bld: 125 mg/dL — ABNORMAL HIGH (ref 65–99)
Potassium: 3.9 mmol/L (ref 3.5–5.1)
Sodium: 131 mmol/L — ABNORMAL LOW (ref 135–145)

## 2017-01-07 LAB — CBC
HCT: 29.7 % — ABNORMAL LOW (ref 39.0–52.0)
Hemoglobin: 9.5 g/dL — ABNORMAL LOW (ref 13.0–17.0)
MCH: 29.4 pg (ref 26.0–34.0)
MCHC: 32 g/dL (ref 30.0–36.0)
MCV: 92 fL (ref 78.0–100.0)
Platelets: 173 10*3/uL (ref 150–400)
RBC: 3.23 MIL/uL — ABNORMAL LOW (ref 4.22–5.81)
RDW: 13.4 % (ref 11.5–15.5)
WBC: 10.1 10*3/uL (ref 4.0–10.5)

## 2017-01-07 MED ORDER — INSULIN ASPART 100 UNIT/ML ~~LOC~~ SOLN
0.0000 [IU] | Freq: Three times a day (TID) | SUBCUTANEOUS | Status: DC
  Administered 2017-01-08: 4 [IU] via SUBCUTANEOUS

## 2017-01-07 NOTE — Procedures (Signed)
Chest Tube Insertion Procedure Note  Indications:  Clinically significant Pneumothorax  Pre-operative Diagnosis: Pneumothorax  Post-operative Diagnosis: Pneumothorax  Procedure Details  Informed consent was obtained for the procedure, including sedation.  Risks of lung perforation, hemorrhage, arrhythmia, and adverse drug reaction were discussed.   After sterile skin prep, using standard technique, a 10 French tube was placed in the right lateral 6 rib space.  Findings: Air removed and 50 ml of strw colored fluid   Estimated Blood Loss:  Minimal         Specimens:  None              Complications:  None; patient tolerated the procedure well.         Disposition: stable in icu         Condition: stable  Attending Attestation: I performed the procedure.

## 2017-01-07 NOTE — Progress Notes (Signed)
Progress Note  Patient Name: William Orr Date of Encounter: 01/07/2017  Primary Cardiologist: Shelva Majestic  Subjective   Mild right-sided chest discomfort since the placement of a new chest tube. Mediastinal tubes of been removed. Conversation with both the wife and the patient.  Inpatient Medications    Scheduled Meds: . acetaminophen  1,000 mg Oral Q6H  . aspirin EC  81 mg Oral Daily  . atorvastatin  80 mg Oral q1800  . bisacodyl  10 mg Oral Daily   Or  . bisacodyl  10 mg Rectal Daily  . carvedilol  25 mg Oral BID WC  . clopidogrel  75 mg Oral Daily  . diltiazem  300 mg Oral Daily  . docusate sodium  200 mg Oral Daily  . enoxaparin (LOVENOX) injection  30 mg Subcutaneous Q24H  . folic acid-pyridoxine-cyancobalamin  1 tablet Oral Daily  . insulin aspart  0-24 Units Subcutaneous Q4H  . insulin detemir  25 Units Subcutaneous Daily  . iron polysaccharides  150 mg Oral Daily  . losartan  50 mg Oral Daily  . pantoprazole  40 mg Oral Daily  . sodium chloride flush  3 mL Intravenous Q12H   Continuous Infusions: . sodium chloride    . insulin (NOVOLIN-R) infusion Stopped (01/05/17 1200)  . lactated ringers 20 mL/hr at 01/07/17 1100  . lactated ringers Stopped (01/04/17 2300)  . nitroGLYCERIN Stopped (01/05/17 1826)   PRN Meds: aluminum hydroxide-magnesium carbonate, labetalol, metoprolol tartrate, morphine injection, ondansetron (ZOFRAN) IV, oxyCODONE, sodium chloride flush, traMADol   Vital Signs    Vitals:   01/07/17 1000 01/07/17 1030 01/07/17 1041 01/07/17 1100  BP: 122/74 121/65 121/65 131/67  Pulse: 63 68  63  Resp: 17 14  15   Temp:      TempSrc:      SpO2: 98% 99%  99%  Weight:      Height:        Intake/Output Summary (Last 24 hours) at 01/07/17 1230 Last data filed at 01/07/17 1100  Gross per 24 hour  Intake             1900 ml  Output             1540 ml  Net              360 ml   Filed Weights   01/06/17 0300 01/06/17 0600 01/07/17 0630    Weight: 248 lb 10.9 oz (112.8 kg) 246 lb 4.8 oz (111.7 kg) 248 lb 0.3 oz (112.5 kg)    Telemetry    Normal sinus rhythm - Personally Reviewed  ECG    No new study - Personally Reviewed  Physical Exam  Appears healthy and comfortable GEN: No acute distress.   Neck: No JVD Cardiac: RRR, no murmurs, rubs, or gallops.  Respiratory: Clear to auscultation bilaterally.Right lateral chest tube.. Mediastinal tubes of been removed. GI: Soft, nontender, non-distended  MS: No edema; No deformity. Neuro:  Nonfocal  Psych: Normal affect   Labs    Chemistry Recent Labs Lab 01/03/17 1528  01/05/17 0400 01/05/17 1614 01/05/17 1621 01/06/17 0345 01/07/17 0445  NA 137  < > 135  --  133* 134* 131*  K 4.2  < > 4.2  --  3.8 4.1 3.9  CL 102  < > 104  --  93* 99* 100*  CO2 24  --  25  --   --  27 27  GLUCOSE 137*  < > 98  --  191*  156* 125*  BUN 8  < > 9  --  12 11 14   CREATININE 0.95  < > 0.76 1.02 1.00 0.95 0.87  CALCIUM 9.9  --  8.5*  --   --  8.4* 8.3*  PROT 7.6  --   --   --   --   --   --   ALBUMIN 4.0  --   --   --   --   --   --   AST 35  --   --   --   --   --   --   ALT 39  --   --   --   --   --   --   ALKPHOS 68  --   --   --   --   --   --   BILITOT 0.7  --   --   --   --   --   --   GFRNONAA >60  < > >60 >60  --  >60 >60  GFRAA >60  < > >60 >60  --  >60 >60  ANIONGAP 11  --  6  --   --  8 4*  < > = values in this interval not displayed.   Hematology Recent Labs Lab 01/05/17 1614 01/05/17 1621 01/06/17 0345 01/07/17 0445  WBC 8.4  --  8.8 10.1  RBC 3.28*  --  3.01* 3.23*  HGB 9.7* 10.2* 8.9* 9.5*  HCT 30.2* 30.0* 28.1* 29.7*  MCV 92.1  --  93.4 92.0  MCH 29.6  --  29.6 29.4  MCHC 32.1  --  31.7 32.0  RDW 13.6  --  13.7 13.4  PLT 162  --  156 173    Cardiac Enzymes Recent Labs Lab 12/31/16 1747 01/01/17 0042 01/01/17 0752  TROPONINI 1.03* 0.85* 0.53*   No results for input(s): TROPIPOC in the last 168 hours.   BNPNo results for input(s): BNP,  PROBNP in the last 168 hours.   DDimer No results for input(s): DDIMER in the last 168 hours.   Radiology    Dg Chest Port 1 View  Result Date: 01/07/2017 CLINICAL DATA:  Chest tube placement. EXAM: PORTABLE CHEST 1 VIEW COMPARISON:  01/07/2017 FINDINGS: Interval placement of a right-sided chest tube. Persistent large right pneumothorax. Right perihilar airspace disease likely reflecting atelectasis. No left pneumothorax. Mild left interstitial thickening. No pleural effusion. Stable cardiomegaly. Prior CABG. Right jugular central venous sheath in satisfactory position. IMPRESSION: Interval placement of right-sided chest tube with a persistent large right pneumothorax. Electronically Signed   By: Kathreen Devoid   On: 01/07/2017 09:11   Dg Chest Port 1 View  Result Date: 01/07/2017 CLINICAL DATA:  History given of recent CABG. No shortness of breath is reported. EXAM: PORTABLE CHEST 1 VIEW COMPARISON:  01/06/2017 FINDINGS: All chest tubes and mediastinal tubes have been removed. There is a new large RIGHT pneumothorax, greater than 20%. Minimal RIGHT-to-LEFT shift. Mild atelectasis and/or edema in the RIGHT lung. Mild LEFT lower lobe atelectasis. Swan-Ganz introducer good position. Telemetry leads. Cardiomegaly with median sternotomy and sequelae of CABG. IMPRESSION: Large RIGHT pneumothorax. This is a new finding. Minimal RIGHT-to-LEFT shift. I personally discussed the findings by telephone with patient's nurse at 7:21 a.m., who will contact the referring provider. Electronically Signed   By: Staci Righter M.D.   On: 01/07/2017 07:22   Dg Chest Port 1 View  Result Date: 01/06/2017 CLINICAL DATA:  Atelectasis. EXAM: PORTABLE CHEST  1 VIEW COMPARISON:  Radiographs of Jan 05, 2017. FINDINGS: Stable cardiomegaly. Sternotomy wires are noted. No definite pneumothorax is noted. Right internal jugular venous sheath is noted. Left-sided chest tube is unchanged in position. Stable mild bibasilar subsegmental  atelectasis. Status post coronary artery bypass graft. Bony thorax is unremarkable. IMPRESSION: Left-sided chest tube is unchanged in position without pneumothorax. Mild bibasilar subsegmental atelectasis. Electronically Signed   By: Marijo Conception, M.D.   On: 01/06/2017 07:33    Cardiac Studies   No new data  Patient Profile     71 y.o. male diabetic with history of PVCs admitted with progressive angina and found to have complex LAD disease. Underwent successful CABG with LIMA to LAD and SVG to the first and second diagonals.  Assessment & Plan    1. Coronary disease resuming with unstable angina and resulting in coronary bypass grafting. Currently stable without evidence of myocardial injury or recurrent angina now 4 days post bypass surgery. 2. Risk factor modification needs to be continued post discharge.  No actions required from cardiology service at this time.  Signed, Sinclair Grooms, MD  01/07/2017, 12:30 PM

## 2017-01-07 NOTE — Progress Notes (Addendum)
Patient ID: William Orr, male   DOB: 1946/04/14, 71 y.o.   MRN: [id number] .      [address]       [place] [address]             [phone]        CARDIOTHORACIC SURGERY PROGRESS NOTE   R1 Day Post-Op Procedure(s) (LRB): CORONARY ARTERY BYPASS GRAFTING (CABG)X3, ON PUMP, USING LEFT INTERNAL MAMMARY ARTERY AND RIGHT GREATER SAPHENOUS VEIN HARVESTED ENDOSCOPICALLY, LIMA-LAD, SEQ SVG- DIAG 1 -DIAG 2 (N/A) TRANSESOPHAGEAL ECHOCARDIOGRAM (TEE) (N/A)  Subjective: Up in chair , bp up some, complaint of gas and nausea this am, could not eat breakfast  Objective: Vital signs: BP Readings from Last 1 Encounters:  01/07/17 (!) 146/53   Pulse Readings from Last 1 Encounters:  01/07/17 68   Resp Readings from Last 1 Encounters:  01/07/17 (!) 8   Temp Readings from Last 1 Encounters:  01/07/17 98.6 F (37 C) (Oral)    Hemodynamics:    Physical Exam:  Rhythm:   sinus  Breath sounds: Decreased on the right   Heart sounds:  RRR  Incisions:  Dressings dry, intact  Abdomen:  Soft, mildly distended, hypoactive bowel sounds   Extremities:  Warm, well-perfused     Intake/Output from previous day: 05/26 0701 - 05/27 0700 In: 1430 [P.O.:900; I.V.:480; IV Piggyback:50] Out: 1920 [Urine:1920] Intake/Output this shift: Total I/O In: 120 [P.O.:120] Out: -   Lab Results:  CBC:  Recent Labs  01/06/17 0345 01/07/17 0445  WBC 8.8 10.1  HGB 8.9* 9.5*  HCT 28.1* 29.7*  PLT 156 173    BMET:   Recent Labs  01/06/17 0345 01/07/17 0445  NA 134* 131*  K 4.1 3.9  CL 99* 100*  CO2 27 27  GLUCOSE 156* 125*  BUN 11 14  CREATININE 0.95 0.87  CALCIUM 8.4* 8.3*     PT/INR:    Recent Labs  01/04/17 1200  LABPROT 16.6*  INR 1.33    CBG (last 3)   Recent Labs  01/06/17 1950 01/06/17 2352 01/07/17 0351  GLUCAP 184* 140* 121*    ABG    Component Value Date/Time   PHART 7.358 01/04/2017 1743   PCO2ART 42.8 01/04/2017 1743   PO2ART  126.0 (H) 01/04/2017 1743   HCO3 24.1 01/04/2017 1743   TCO2 26 01/05/2017 1621   ACIDBASEDEF 1.0 01/04/2017 1743   O2SAT 99.0 01/04/2017 1743    CXR:Dg Chest Port 1 View  Result Date: 01/07/2017 CLINICAL DATA:  History given of recent CABG. No shortness of breath is reported. EXAM: PORTABLE CHEST 1 VIEW COMPARISON:  01/06/2017 FINDINGS: All chest tubes and mediastinal tubes have been removed. There is a new large RIGHT pneumothorax, greater than 20%. Minimal RIGHT-to-LEFT shift. Mild atelectasis and/or edema in the RIGHT lung. Mild LEFT lower lobe atelectasis. Swan-Ganz introducer good position. Telemetry leads. Cardiomegaly with median sternotomy and sequelae of CABG. IMPRESSION: Large RIGHT pneumothorax. This is a new finding. Minimal RIGHT-to-LEFT shift. I personally discussed the findings by telephone with patient's nurse at 7:21 a.m., who will contact the referring provider. Electronically Signed   By: Staci Righter M.D.   On: 01/07/2017 07:22   EKG:  PVC's mostly sinus    Assessment/Plan: S/P Procedure(s) (LRB): CORONARY ARTERY BYPASS GRAFTING (CABG)X3, ON PUMP, USING LEFT INTERNAL MAMMARY ARTERY AND RIGHT GREATER SAPHENOUS VEIN HARVESTED ENDOSCOPICALLY, LIMA-LAD, SEQ SVG- DIAG 1 -DIAG 2 (N/A) TRANSESOPHAGEAL ECHOCARDIOGRAM (TEE) (N/A)  POD3 Maintaining NSR w/ stable hemodynamics back  on beta blocker and Cardizem CD and cozarr  Breathing comfortably w/ O2 sats 92-100% on 2 L/min Expected post op acute blood loss anemia, Hgb 9.5 up today  Expected post op volume excess,responded to lasix yesterday  Hypertension- back on preop meds  History of PVC's Type II diabetes mellitus,  glycemic control on levimir  20 u daily  Hyperlipidemia   Mobilize  Restart oral agents when taking po  better    Full dose ASA for now then decrease to 81 mg/day and add Plavix in 2-3 days once chest tubes out  Resume Lipitor in 2-3 days  Start iron and folate 2-3 days when taking po   Chest xray  this am  has moderate right ptx not present yesterday- large enough and with mild shift to place  Right chest tube, risk and options of chest tube discussed with the patient .    Grace Isaac, MD 01/07/2017 8:34 AM Patient ID: William Orr, male   DOB: Jun 06, 1946, 71 y.o.   MRN: [id number]

## 2017-01-07 NOTE — Progress Notes (Signed)
Patient ID: William Orr, male   DOB: 1946/05/09, 71 y.o.   MRN: [id number] EVENING ROUNDS NOTE :     [address]       [place] [address]             [phone]                 3 Days Post-Op Procedure(s) (LRB): CORONARY ARTERY BYPASS GRAFTING (CABG)X3, ON PUMP, USING LEFT INTERNAL MAMMARY ARTERY AND RIGHT GREATER SAPHENOUS VEIN HARVESTED ENDOSCOPICALLY, LIMA-LAD, SEQ SVG- DIAG 1 -DIAG 2 (N/A) TRANSESOPHAGEAL ECHOCARDIOGRAM (TEE) (N/A)  Total Length of Stay:  LOS: 7 days  BP (!) 142/64   Pulse 68   Temp 97.7 F (36.5 C) (Oral)   Resp 16   Ht 6\' 1"  (1.854 m)   Wt 248 lb 0.3 oz (112.5 kg)   SpO2 94%   BMI 32.72 kg/m   .Intake/Output      05/27 0701 - 05/28 0700   P.O. 1560   I.V. (mL/kg) 240 (2.1)   IV Piggyback    Total Intake(mL/kg) 1800 (16)   Urine (mL/kg/hr) 410 (0.3)   Emesis/NG output    Chest Tube 160 (0.1)   Total Output 570   Net +1230         . sodium chloride    . insulin (NOVOLIN-R) infusion Stopped (01/05/17 1200)  . lactated ringers 20 mL/hr at 01/07/17 1900  . lactated ringers Stopped (01/04/17 2300)  . nitroGLYCERIN Stopped (01/05/17 1826)     Lab Results  Component Value Date   WBC 10.1 01/07/2017   HGB 9.5 (L) 01/07/2017   HCT 29.7 (L) 01/07/2017   PLT 173 01/07/2017   GLUCOSE 125 (H) 01/07/2017   CHOL 116 01/01/2017   TRIG 413 (H) 01/01/2017   HDL 26 (L) 01/01/2017   LDLCALC UNABLE TO CALCULATE IF TRIGLYCERIDE OVER 400 mg/dL 01/01/2017   ALT 39 01/03/2017   AST 35 01/03/2017   NA 131 (L) 01/07/2017   K 3.9 01/07/2017   CL 100 (L) 01/07/2017   CREATININE 0.87 01/07/2017   BUN 14 01/07/2017   CO2 27 01/07/2017   TSH 2.850 08/27/2015   INR 1.33 01/04/2017   HGBA1C 7.2 (H) 01/03/2017    feels better, nausea improved eating more No air leak from chest tube now Fu chest xray In am  Grace Isaac MD  Beeper [phone] Office [phone] 01/07/2017 7:31 PM

## 2017-01-08 ENCOUNTER — Inpatient Hospital Stay (HOSPITAL_COMMUNITY): Payer: Medicare Other

## 2017-01-08 LAB — BASIC METABOLIC PANEL
Anion gap: 7 (ref 5–15)
BUN: 12 mg/dL (ref 6–20)
CO2: 26 mmol/L (ref 22–32)
Calcium: 8.6 mg/dL — ABNORMAL LOW (ref 8.9–10.3)
Chloride: 99 mmol/L — ABNORMAL LOW (ref 101–111)
Creatinine, Ser: 0.81 mg/dL (ref 0.61–1.24)
GFR calc Af Amer: >60 mL/min (ref >60)
GFR calc non Af Amer: >60 mL/min (ref >60)
Glucose, Bld: 171 mg/dL — ABNORMAL HIGH (ref 65–99)
Potassium: 3.9 mmol/L (ref 3.5–5.1)
Sodium: 132 mmol/L — ABNORMAL LOW (ref 135–145)

## 2017-01-08 LAB — GLUCOSE, CAPILLARY
GLUCOSE-CAPILLARY: 166 mg/dL — AB (ref 65–99)
GLUCOSE-CAPILLARY: 209 mg/dL — AB (ref 65–99)
Glucose-Capillary: 245 mg/dL — ABNORMAL HIGH (ref 65–99)
Glucose-Capillary: 201 mg/dL — ABNORMAL HIGH (ref 65–99)

## 2017-01-08 LAB — CBC
HCT: 29 % — ABNORMAL LOW (ref 39.0–52.0)
Hemoglobin: 9.3 g/dL — ABNORMAL LOW (ref 13.0–17.0)
MCH: 29.6 pg (ref 26.0–34.0)
MCHC: 32.1 g/dL (ref 30.0–36.0)
MCV: 92.4 fL (ref 78.0–100.0)
Platelets: 212 10*3/uL (ref 150–400)
RBC: 3.14 MIL/uL — ABNORMAL LOW (ref 4.22–5.81)
RDW: 13.6 % (ref 11.5–15.5)
WBC: 7.4 10*3/uL (ref 4.0–10.5)

## 2017-01-08 MED ORDER — BISACODYL 10 MG RE SUPP: 10.0000 mg | Freq: Every day | RECTAL | Status: DC | PRN

## 2017-01-08 MED ORDER — ONDANSETRON HCL 4 MG PO TABS: 4.0000 mg | ORAL_TABLET | Freq: Four times a day (QID) | ORAL | Status: DC | PRN

## 2017-01-08 MED ORDER — TRAMADOL HCL 50 MG PO TABS
50.0000 mg | ORAL_TABLET | ORAL | Status: DC | PRN
  Administered 2017-01-08 – 2017-01-09 (×3): 50 mg via ORAL
  Filled 2017-01-08 (×3): qty 1

## 2017-01-08 MED ORDER — SODIUM CHLORIDE 0.9% FLUSH: 3.0000 mL | INTRAVENOUS | Status: DC | PRN

## 2017-01-08 MED ORDER — ONDANSETRON HCL 4 MG/2ML IJ SOLN: 4.0000 mg | Freq: Four times a day (QID) | INTRAMUSCULAR | Status: DC | PRN

## 2017-01-08 MED ORDER — OXYCODONE HCL 5 MG PO TABS: 5.0000 mg | ORAL_TABLET | ORAL | Status: DC | PRN

## 2017-01-08 MED ORDER — BISACODYL 5 MG PO TBEC: 10.0000 mg | DELAYED_RELEASE_TABLET | Freq: Every day | ORAL | Status: DC | PRN

## 2017-01-08 MED ORDER — SODIUM CHLORIDE 0.9 % IV SOLN: 250.0000 mL | INTRAVENOUS | Status: DC | PRN

## 2017-01-08 MED ORDER — INSULIN ASPART 100 UNIT/ML ~~LOC~~ SOLN
0.0000 [IU] | Freq: Three times a day (TID) | SUBCUTANEOUS | Status: DC
  Administered 2017-01-08 (×3): 8 [IU] via SUBCUTANEOUS
  Administered 2017-01-09 (×4): 4 [IU] via SUBCUTANEOUS
  Administered 2017-01-10: 2 [IU] via SUBCUTANEOUS

## 2017-01-08 MED ORDER — PANTOPRAZOLE SODIUM 40 MG PO TBEC
40.0000 mg | DELAYED_RELEASE_TABLET | Freq: Every day | ORAL | Status: DC
  Administered 2017-01-08 – 2017-01-10 (×3): 40 mg via ORAL
  Filled 2017-01-08 (×3): qty 1

## 2017-01-08 MED ORDER — SODIUM CHLORIDE 0.9% FLUSH
3.0000 mL | Freq: Two times a day (BID) | INTRAVENOUS | Status: DC
  Administered 2017-01-08 – 2017-01-09 (×3): 3 mL via INTRAVENOUS

## 2017-01-08 MED ORDER — MOVING RIGHT ALONG BOOK
Freq: Once | Status: AC
  Administered 2017-01-08: 10:00:00
  Filled 2017-01-08: qty 1

## 2017-01-08 MED ORDER — ROSUVASTATIN CALCIUM 10 MG PO TABS
20.0000 mg | ORAL_TABLET | Freq: Every day | ORAL | Status: DC
  Administered 2017-01-08 – 2017-01-09 (×2): 20 mg via ORAL
  Filled 2017-01-08: qty 2
  Filled 2017-01-08: qty 1

## 2017-01-08 NOTE — Progress Notes (Signed)
Pt arrived to 2w from 2h. Pt oriented to room. Vitals obtained. Telemetry box applied and CCMD notified. Pt up in chair with dinner tray. Pt denies needs at this time. Will continue current plan of care.  Grant Fontana BSN, RN

## 2017-01-08 NOTE — Progress Notes (Signed)
Patient ID: Shahid Flori, male   DOB: Jan 04, 1946, 71 y.o.   MRN: [id number] [facility] DAILY ICU PROGRESS NOTE                   [address]            [place] [address]          [phone]   4 Days Post-Op Procedure(s) (LRB): CORONARY ARTERY BYPASS GRAFTING (CABG)X3, ON PUMP, USING LEFT INTERNAL MAMMARY ARTERY AND RIGHT GREATER SAPHENOUS VEIN HARVESTED ENDOSCOPICALLY, LIMA-LAD, SEQ SVG- DIAG 1 -DIAG 2 (N/A) TRANSESOPHAGEAL ECHOCARDIOGRAM (TEE) (N/A)  Total Length of Stay:  LOS: 8 days   Subjective: Feels well, nausea better no gi complain now, discomfort at chest tube site   Objective: Vital signs in last 24 hours: Temp:  [97.6 F (36.4 C)-98.6 F (37 C)] 97.9 F (36.6 C) (05/28 0700) Pulse Rate:  [36-73] 61 (05/28 0700) Cardiac Rhythm: Normal sinus rhythm (05/28 0400) Resp:  [10-26] 11 (05/28 0700) BP: (103-165)/(50-74) 141/63 (05/28 0700) SpO2:  [91 %-100 %] 93 % (05/28 0700) Weight:  [249 lb 1.9 oz (113 kg)] 249 lb 1.9 oz (113 kg) (05/28 0500)  Filed Weights   01/06/17 0600 01/07/17 0630 01/08/17 0500  Weight: 246 lb 4.8 oz (111.7 kg) 248 lb 0.3 oz (112.5 kg) 249 lb 1.9 oz (113 kg)    Weight change: 1 lb 1.6 oz (0.5 kg)   Hemodynamic parameters for last 24 hours:    Intake/Output from previous day: 05/27 0701 - 05/28 0700 In: 2090 [P.O.:1680; I.V.:410] Out: 1880 [Urine:1710; Chest Tube:170]  Intake/Output this shift: No intake/output data recorded.  Current Meds: Scheduled Meds: . acetaminophen  1,000 mg Oral Q6H  . aspirin EC  81 mg Oral Daily  . atorvastatin  80 mg Oral q1800  . bisacodyl  10 mg Oral Daily   Or  . bisacodyl  10 mg Rectal Daily  . carvedilol  25 mg Oral BID WC  . clopidogrel  75 mg Oral Daily  . diltiazem  300 mg Oral Daily  . docusate sodium  200 mg Oral Daily  . enoxaparin (LOVENOX) injection  30 mg Subcutaneous Q24H  . folic acid-pyridoxine-cyancobalamin  1 tablet Oral Daily  . insulin aspart  0-24 Units  Subcutaneous TID AC & HS  . insulin detemir  25 Units Subcutaneous Daily  . iron polysaccharides  150 mg Oral Daily  . losartan  50 mg Oral Daily  . pantoprazole  40 mg Oral Daily  . sodium chloride flush  3 mL Intravenous Q12H   Continuous Infusions: . sodium chloride    . insulin (NOVOLIN-R) infusion Stopped (01/05/17 1200)  . lactated ringers Stopped (01/08/17 0330)  . lactated ringers Stopped (01/04/17 2300)  . nitroGLYCERIN Stopped (01/05/17 1826)   PRN Meds:.aluminum hydroxide-magnesium carbonate, labetalol, metoprolol tartrate, morphine injection, ondansetron (ZOFRAN) IV, oxyCODONE, sodium chloride flush, traMADol  General appearance: alert and cooperative Neurologic: intact Heart: regularly irregular rhythm Lungs: diminished breath sounds bibasilar Abdomen: soft, non-tender; bowel sounds normal; no masses,  no organomegaly Extremities: extremities normal, atraumatic, no cyanosis or edema and Homans sign is negative, no sign of DVT Wound: sternum  Lab Results: CBC: Recent Labs  01/07/17 0445 01/08/17 0215  WBC 10.1 7.4  HGB 9.5* 9.3*  HCT 29.7* 29.0*  PLT 173 212   BMET:  Recent Labs  01/07/17 0445 01/08/17 0215  NA 131* 132*  K 3.9 3.9  CL 100* 99*  CO2 27 26  GLUCOSE 125*  171*  BUN 14 12  CREATININE 0.87 0.81  CALCIUM 8.3* 8.6*    CMET: Lab Results  Component Value Date   WBC 7.4 01/08/2017   HGB 9.3 (L) 01/08/2017   HCT 29.0 (L) 01/08/2017   PLT 212 01/08/2017   GLUCOSE 171 (H) 01/08/2017   CHOL 116 01/01/2017   TRIG 413 (H) 01/01/2017   HDL 26 (L) 01/01/2017   LDLCALC UNABLE TO CALCULATE IF TRIGLYCERIDE OVER 400 mg/dL 01/01/2017   ALT 39 01/03/2017   AST 35 01/03/2017   NA 132 (L) 01/08/2017   K 3.9 01/08/2017   CL 99 (L) 01/08/2017   CREATININE 0.81 01/08/2017   BUN 12 01/08/2017   CO2 26 01/08/2017   TSH 2.850 08/27/2015   INR 1.33 01/04/2017   HGBA1C 7.2 (H) 01/03/2017      PT/INR: No results for input(s): LABPROT, INR in the  last 72 hours. Radiology: Dg Chest Port 1 View  Result Date: 01/08/2017 CLINICAL DATA:  Check chest tube EXAM: PORTABLE CHEST 1 VIEW COMPARISON:  01/08/2017 FINDINGS: Right-sided chest tube is noted in place. Cardiac shadow remains enlarged. Postsurgical changes are seen. No pneumothorax is noted. Mild vascular congestion remains. Some right basilar atelectasis is seen as well. IMPRESSION: No recurrent pneumothorax is seen. Mild right basilar atelectasis is noted. Electronically Signed   By: Inez Catalina M.D.   On: 01/08/2017 07:37   Dg Chest Port 1 View  Result Date: 01/08/2017 CLINICAL DATA:  RIGHT chest pain tonight, assess chest tube. EXAM: PORTABLE CHEST 1 VIEW COMPARISON:  Chest radiograph Jan 07, 2017 FINDINGS: Cardiac silhouette is mildly enlarged, mediastinal silhouette is nonsuspicious, status post median sternotomy for CABG. RIGHT lung base pigtail chest tube, trace residual apical pneumo thorax. Pulmonary vascular congestion without pleural effusion or focal consolidation. Soft tissue planes and included osseous structures are nonsuspicious. IMPRESSION: RIGHT chest tube and trace apical pneumothorax. Stable cardiomegaly and pulmonary vascular congestion. Electronically Signed   By: Elon Alas M.D.   On: 01/08/2017 00:20   Dg Chest Port 1 View  Result Date: 01/07/2017 CLINICAL DATA:  Chest tube placement. EXAM: PORTABLE CHEST 1 VIEW COMPARISON:  01/07/2017 FINDINGS: Interval placement of a right-sided chest tube. Persistent large right pneumothorax. Right perihilar airspace disease likely reflecting atelectasis. No left pneumothorax. Mild left interstitial thickening. No pleural effusion. Stable cardiomegaly. Prior CABG. Right jugular central venous sheath in satisfactory position. IMPRESSION: Interval placement of right-sided chest tube with a persistent large right pneumothorax. Electronically Signed   By: Kathreen Devoid   On: 01/07/2017 09:11     Assessment/Plan: S/P Procedure(s)  (LRB): CORONARY ARTERY BYPASS GRAFTING (CABG)X3, ON PUMP, USING LEFT INTERNAL MAMMARY ARTERY AND RIGHT GREATER SAPHENOUS VEIN HARVESTED ENDOSCOPICALLY, LIMA-LAD, SEQ SVG- DIAG 1 -DIAG 2 (N/A) TRANSESOPHAGEAL ECHOCARDIOGRAM (TEE) (N/A) Mobilize Diuresis Diabetes control Plan for transfer to step-down: see transfer orders Ct to water seal today, likely d/c tomorrow  Poss home wed/thursday  Discussed bp  meds with Dr Lance Sell 01/08/2017 8:28 AM

## 2017-01-08 NOTE — Progress Notes (Signed)
Progress Note  Patient Name: William Orr Date of Encounter: 01/08/2017  Primary Cardiologist: Corky Downs  Subjective   Feels better; no chest pain. Slept with his own CPAP last night.  Inpatient Medications    Scheduled Meds: . acetaminophen  1,000 mg Oral Q6H  . aspirin EC  81 mg Oral Daily  . atorvastatin  80 mg Oral q1800  . bisacodyl  10 mg Oral Daily   Or  . bisacodyl  10 mg Rectal Daily  . carvedilol  25 mg Oral BID WC  . clopidogrel  75 mg Oral Daily  . diltiazem  300 mg Oral Daily  . docusate sodium  200 mg Oral Daily  . enoxaparin (LOVENOX) injection  30 mg Subcutaneous Q24H  . folic acid-pyridoxine-cyancobalamin  1 tablet Oral Daily  . insulin aspart  0-24 Units Subcutaneous TID AC & HS  . insulin detemir  25 Units Subcutaneous Daily  . iron polysaccharides  150 mg Oral Daily  . losartan  50 mg Oral Daily  . pantoprazole  40 mg Oral Daily  . sodium chloride flush  3 mL Intravenous Q12H   Continuous Infusions: . sodium chloride    . insulin (NOVOLIN-R) infusion Stopped (01/05/17 1200)  . lactated ringers Stopped (01/08/17 0330)  . lactated ringers Stopped (01/04/17 2300)  . nitroGLYCERIN Stopped (01/05/17 1826)   PRN Meds: aluminum hydroxide-magnesium carbonate, labetalol, metoprolol tartrate, morphine injection, ondansetron (ZOFRAN) IV, oxyCODONE, sodium chloride flush, traMADol   Vital Signs    Vitals:   01/08/17 0400 01/08/17 0500 01/08/17 0600 01/08/17 0700  BP: 124/63 139/64 (!) 156/61 (!) 141/63  Pulse: 63 (!) 59 62 61  Resp: 19 13 15 11   Temp: 97.6 F (36.4 C)   97.9 F (36.6 C)  TempSrc: Oral   Oral  SpO2: 97% 92% 94% 93%  Weight:  249 lb 1.9 oz (113 kg)    Height:        Intake/Output Summary (Last 24 hours) at 01/08/17 0759 Last data filed at 01/08/17 0600  Gross per 24 hour  Intake             1970 ml  Output             1880 ml  Net               90 ml    I/O since admission: -4683  Filed Weights   01/06/17 0600 01/07/17  0630 01/08/17 0500  Weight: 246 lb 4.8 oz (111.7 kg) 248 lb 0.3 oz (112.5 kg) 249 lb 1.9 oz (113 kg)    Telemetry    NSR at 54 with PVC - Personally Reviewed  ECG    ECG (independently read by me): last 01/05/17: NSR at 80 with frequent PVCs  Physical Exam   BP (!) 141/63   Pulse 61   Temp 97.9 F (36.6 C) (Oral)   Resp 11   Ht 6\' 1"  (1.854 m)   Wt 249 lb 1.9 oz (113 kg)   SpO2 93%   BMI 32.87 kg/m  General: Alert, oriented, no distress.  Skin: normal turgor, no rashes, warm and dry HEENT: Normocephalic, atraumatic. Pupils equal round and reactive to light; sclera anicteric; extraocular muscles intact;  Nose without nasal septal hypertrophy Mouth/Parynx benign; Mallinpatti scale 3 Neck: No JVD, no carotid bruits; normal carotid upstroke Lungs: clear to ausculatation and percussion; no wheezing or rales Chest wall: without tenderness to palpitation; chest tube in place Heart: PMI not displaced, RRR with PVC's,  s1 s2 normal, 1/6 systolic murmur, no diastolic murmur, no rubs, gallops, thrills, or heaves Abdomen: soft, nontender; no hepatosplenomehaly, BS+; abdominal aorta nontender and not dilated by palpation. Back: no CVA tenderness Pulses 2+ Musculoskeletal: full range of motion, normal strength, no joint deformities Extremities: trivial edema; no clubbing cyanosis, Homan's sign negative  Neurologic: grossly nonfocal; Cranial nerves grossly wnl Psychologic: Normal mood and affect     Labs    Chemistry Recent Labs Lab 01/03/17 1528  01/06/17 0345 01/07/17 0445 01/08/17 0215  NA 137  < > 134* 131* 132*  K 4.2  < > 4.1 3.9 3.9  CL 102  < > 99* 100* 99*  CO2 24  < > 27 27 26   GLUCOSE 137*  < > 156* 125* 171*  BUN 8  < > 11 14 12   CREATININE 0.95  < > 0.95 0.87 0.81  CALCIUM 9.9  < > 8.4* 8.3* 8.6*  PROT 7.6  --   --   --   --   ALBUMIN 4.0  --   --   --   --   AST 35  --   --   --   --   ALT 39  --   --   --   --   ALKPHOS 68  --   --   --   --   BILITOT  0.7  --   --   --   --   GFRNONAA >60  < > >60 >60 >60  GFRAA >60  < > >60 >60 >60  ANIONGAP 11  < > 8 4* 7  < > = values in this interval not displayed.   Hematology Recent Labs Lab 01/06/17 0345 01/07/17 0445 01/08/17 0215  WBC 8.8 10.1 7.4  RBC 3.01* 3.23* 3.14*  HGB 8.9* 9.5* 9.3*  HCT 28.1* 29.7* 29.0*  MCV 93.4 92.0 92.4  MCH 29.6 29.4 29.6  MCHC 31.7 32.0 32.1  RDW 13.7 13.4 13.6  PLT 156 173 212    Cardiac EnzymesNo results for input(s): TROPONINI in the last 168 hours. No results for input(s): TROPIPOC in the last 168 hours.   BNPNo results for input(s): BNP, PROBNP in the last 168 hours.   DDimer No results for input(s): DDIMER in the last 168 hours.   Lipid Panel     Component Value Date/Time   CHOL 116 01/01/2017 0042   CHOL 147 08/27/2015 0955   TRIG 413 (H) 01/01/2017 0042   HDL 26 (L) 01/01/2017 0042   HDL 27 (L) 08/27/2015 0955   CHOLHDL 4.5 01/01/2017 0042   VLDL UNABLE TO CALCULATE IF TRIGLYCERIDE OVER 400 mg/dL 01/01/2017 0042   LDLCALC UNABLE TO CALCULATE IF TRIGLYCERIDE OVER 400 mg/dL 01/01/2017 0042   LDLCALC 53 08/27/2015 0955    Radiology    Dg Chest Port 1 View  Result Date: 01/08/2017 CLINICAL DATA:  Check chest tube EXAM: PORTABLE CHEST 1 VIEW COMPARISON:  01/08/2017 FINDINGS: Right-sided chest tube is noted in place. Cardiac shadow remains enlarged. Postsurgical changes are seen. No pneumothorax is noted. Mild vascular congestion remains. Some right basilar atelectasis is seen as well. IMPRESSION: No recurrent pneumothorax is seen. Mild right basilar atelectasis is noted. Electronically Signed   By: Inez Catalina M.D.   On: 01/08/2017 07:37   Dg Chest Port 1 View  Result Date: 01/08/2017 CLINICAL DATA:  RIGHT chest pain tonight, assess chest tube. EXAM: PORTABLE CHEST 1 VIEW COMPARISON:  Chest radiograph Jan 07, 2017 FINDINGS: Cardiac  silhouette is mildly enlarged, mediastinal silhouette is nonsuspicious, status post median sternotomy for  CABG. RIGHT lung base pigtail chest tube, trace residual apical pneumo thorax. Pulmonary vascular congestion without pleural effusion or focal consolidation. Soft tissue planes and included osseous structures are nonsuspicious. IMPRESSION: RIGHT chest tube and trace apical pneumothorax. Stable cardiomegaly and pulmonary vascular congestion. Electronically Signed   By: Elon Alas M.D.   On: 01/08/2017 00:20   Dg Chest Port 1 View  Result Date: 01/07/2017 CLINICAL DATA:  Chest tube placement. EXAM: PORTABLE CHEST 1 VIEW COMPARISON:  01/07/2017 FINDINGS: Interval placement of a right-sided chest tube. Persistent large right pneumothorax. Right perihilar airspace disease likely reflecting atelectasis. No left pneumothorax. Mild left interstitial thickening. No pleural effusion. Stable cardiomegaly. Prior CABG. Right jugular central venous sheath in satisfactory position. IMPRESSION: Interval placement of right-sided chest tube with a persistent large right pneumothorax. Electronically Signed   By: Kathreen Devoid   On: 01/07/2017 09:11   Dg Chest Port 1 View  Result Date: 01/07/2017 CLINICAL DATA:  History given of recent CABG. No shortness of breath is reported. EXAM: PORTABLE CHEST 1 VIEW COMPARISON:  01/06/2017 FINDINGS: All chest tubes and mediastinal tubes have been removed. There is a new large RIGHT pneumothorax, greater than 20%. Minimal RIGHT-to-LEFT shift. Mild atelectasis and/or edema in the RIGHT lung. Mild LEFT lower lobe atelectasis. Swan-Ganz introducer good position. Telemetry leads. Cardiomegaly with median sternotomy and sequelae of CABG. IMPRESSION: Large RIGHT pneumothorax. This is a new finding. Minimal RIGHT-to-LEFT shift. I personally discussed the findings by telephone with patient's nurse at 7:21 a.m., who will contact the referring provider. Electronically Signed   By: Staci Righter M.D.   On: 01/07/2017 07:22    Cardiac Studies   Intra-operative TEE. Result status: Final  result   Left ventricle: Normal cavity size. Concentric hypertrophy of moderate severity. LV systolic function is normal with an EF of 60-65%. No thrombus present. No mass present.  Right ventricle: Normal wall thickness and ejection fraction. Cavity is mildly dilated. No thrombus present. No mass present.  Pulmonic valve: Trace regurgitation     Patient Profile     Mr Howes is a 71 y.o.AA male who is well known to me with h/o HTN, diabetes , hyperlipidemia,OSA,  and PVCs admitted with progressive angina and was found to have complex LAD disease. He underwent successful CABG with LIMA to LAD and SVG to the first and second diagonals on 01/04/2017.  Assessment & Plan    1. Day 4 s/p CABG for complex LAD disease 2. Large right pneumothorax s/p chest tube placement; possibly remove tomorrow per Dr. Servando Snare 3. Frequent PVCs; omn carvedilol 25 mg bid; P 65;  Will monitor , if HR can tolerate may need to increase to 37.5 mg bid, but will not do presently. 4. Essential HTN; 142/70 today 5. Hyperlipidemia; will add rosuvastatin at 20 mg now, may need fenofibrate with TG > 400 and 2 capsules BID of vascepa or lovaza. 6. OSA on CPAP; slept much better last night with CPAP.  Signed, Troy Sine, MD, [facility] 01/08/2017, 7:59 AM

## 2017-01-08 NOTE — Progress Notes (Signed)
Pt ambulated 350 ft with front wheel walker and RN on room air. Pt tolerated well.

## 2017-01-09 ENCOUNTER — Inpatient Hospital Stay (HOSPITAL_COMMUNITY): Payer: Medicare Other

## 2017-01-09 LAB — BASIC METABOLIC PANEL
Anion gap: 9 (ref 5–15)
BUN: 10 mg/dL (ref 6–20)
CO2: 26 mmol/L (ref 22–32)
Calcium: 8.9 mg/dL (ref 8.9–10.3)
Chloride: 101 mmol/L (ref 101–111)
Creatinine, Ser: 0.78 mg/dL (ref 0.61–1.24)
GFR calc Af Amer: >60 mL/min (ref >60)
GFR calc non Af Amer: >60 mL/min (ref >60)
Glucose, Bld: 136 mg/dL — ABNORMAL HIGH (ref 65–99)
Potassium: 3.6 mmol/L (ref 3.5–5.1)
Sodium: 136 mmol/L (ref 135–145)

## 2017-01-09 LAB — GLUCOSE, CAPILLARY
GLUCOSE-CAPILLARY: 174 mg/dL — AB (ref 65–99)
GLUCOSE-CAPILLARY: 183 mg/dL — AB (ref 65–99)
GLUCOSE-CAPILLARY: 169 mg/dL — AB (ref 65–99)
Glucose-Capillary: 162 mg/dL — ABNORMAL HIGH (ref 65–99)

## 2017-01-09 LAB — CBC
HCT: 30.4 % — ABNORMAL LOW (ref 39.0–52.0)
Hemoglobin: 9.9 g/dL — ABNORMAL LOW (ref 13.0–17.0)
MCH: 29.9 pg (ref 26.0–34.0)
MCHC: 32.6 g/dL (ref 30.0–36.0)
MCV: 91.8 fL (ref 78.0–100.0)
Platelets: 268 10*3/uL (ref 150–400)
RBC: 3.31 MIL/uL — ABNORMAL LOW (ref 4.22–5.81)
RDW: 13.8 % (ref 11.5–15.5)
WBC: 5.7 10*3/uL (ref 4.0–10.5)

## 2017-01-09 MED ORDER — LOSARTAN POTASSIUM 50 MG PO TABS
100.0000 mg | ORAL_TABLET | Freq: Every day | ORAL | Status: DC
  Administered 2017-01-09 – 2017-01-10 (×2): 100 mg via ORAL
  Filled 2017-01-09 (×2): qty 2

## 2017-01-09 MED ORDER — DILTIAZEM HCL ER COATED BEADS 180 MG PO CP24
300.0000 mg | ORAL_CAPSULE | Freq: Every day | ORAL | Status: DC
  Administered 2017-01-09: 300 mg via ORAL
  Filled 2017-01-09: qty 1

## 2017-01-09 MED ORDER — ACETAMINOPHEN 325 MG PO TABS
650.0000 mg | ORAL_TABLET | Freq: Four times a day (QID) | ORAL | Status: DC | PRN
  Administered 2017-01-09: 650 mg via ORAL
  Filled 2017-01-09: qty 2

## 2017-01-09 MED ORDER — GLIPIZIDE 5 MG PO TABS
15.0000 mg | ORAL_TABLET | Freq: Every day | ORAL | Status: DC
  Administered 2017-01-10: 15 mg via ORAL
  Filled 2017-01-09: qty 1

## 2017-01-09 MED ORDER — LOSARTAN POTASSIUM 50 MG PO TABS: 100.0000 mg | ORAL_TABLET | Freq: Every day | ORAL | Status: DC

## 2017-01-09 MED ORDER — CARVEDILOL 25 MG PO TABS
25.0000 mg | ORAL_TABLET | Freq: Two times a day (BID) | ORAL | Status: DC
  Administered 2017-01-09 – 2017-01-10 (×3): 25 mg via ORAL
  Filled 2017-01-09 (×2): qty 1

## 2017-01-09 MED ORDER — DILTIAZEM HCL ER BEADS 300 MG PO CP24
300.0000 mg | ORAL_CAPSULE | Freq: Every day | ORAL | Status: DC
  Filled 2017-01-09: qty 1

## 2017-01-09 MED ORDER — METFORMIN HCL 850 MG PO TABS
850.0000 mg | ORAL_TABLET | Freq: Two times a day (BID) | ORAL | Status: DC
  Administered 2017-01-09 – 2017-01-10 (×3): 850 mg via ORAL
  Filled 2017-01-09 (×3): qty 1

## 2017-01-09 MED ORDER — LACTULOSE 10 GM/15ML PO SOLN
20.0000 g | Freq: Every day | ORAL | Status: DC | PRN
  Administered 2017-01-09: 20 g via ORAL
  Filled 2017-01-09: qty 30

## 2017-01-09 NOTE — Progress Notes (Addendum)
[address]       [place] [address]             [phone]           [address]       [place] [address]             [phone]      5 Days Post-Op Procedure(s) (LRB): CORONARY ARTERY BYPASS GRAFTING (CABG)X3, ON PUMP, USING LEFT INTERNAL MAMMARY ARTERY AND RIGHT GREATER SAPHENOUS VEIN HARVESTED ENDOSCOPICALLY, LIMA-LAD, SEQ SVG- DIAG 1 -DIAG 2 (N/A) TRANSESOPHAGEAL ECHOCARDIOGRAM (TEE) (N/A)   Subjective:  Feels pretty good.  Continues to have some mild chest discomfort.  + ambulation   No BM  Objective: Vital signs in last 24 hours: Temp:  [97.8 F (36.6 C)-98.6 F (37 C)] 98.6 F (37 C) (05/29 0544) Pulse Rate:  [59-76] 66 (05/29 0544) Cardiac Rhythm: Normal sinus rhythm;Bundle branch block (05/28 1900) Resp:  [14-24] 18 (05/29 0544) BP: (128-181)/(56-88) 128/66 (05/29 0544) SpO2:  [93 %-97 %] 95 % (05/29 0544) Weight:  [243 lb 4.8 oz (110.4 kg)] 243 lb 4.8 oz (110.4 kg) (05/29 0544)  Intake/Output from previous day: 05/28 0701 - 05/29 0700 In: 480 [P.O.:480] Out: 4400 [Urine:4350; Chest Tube:50]  General appearance: alert, cooperative and no distress Heart: regular rate and rhythm Lungs: clear to auscultation bilaterally Abdomen: soft, non-tender; bowel sounds normal; no masses,  no organomegaly Extremities: edema trace  Wound: clean and dry  Lab Results:  Recent Labs  01/08/17 0215 01/09/17 0332  WBC 7.4 5.7  HGB 9.3* 9.9*  HCT 29.0* 30.4*  PLT 212 268   BMET:  Recent Labs  01/08/17 0215 01/09/17 0332  NA 132* 136  K 3.9 3.6  CL 99* 101  CO2 26 26  GLUCOSE 171* 136*  BUN 12 10  CREATININE 0.81 0.78  CALCIUM 8.6* 8.9    PT/INR: No results for input(s): LABPROT, INR in the last 72 hours. ABG    Component Value Date/Time   PHART 7.358 01/04/2017 1743   HCO3 24.1 01/04/2017 1743   TCO2 26 01/05/2017 1621   ACIDBASEDEF 1.0 01/04/2017 1743   O2SAT 99.0 01/04/2017 1743   CBG (last 3)    Recent Labs  01/08/17 1546 01/08/17 2103 01/09/17 0654  GLUCAP 209* 201* 174*    Assessment/Plan: S/P Procedure(s) (LRB): CORONARY ARTERY BYPASS GRAFTING (CABG)X3, ON PUMP, USING LEFT INTERNAL MAMMARY ARTERY AND RIGHT GREATER SAPHENOUS VEIN HARVESTED ENDOSCOPICALLY, LIMA-LAD, SEQ SVG- DIAG 1 -DIAG 2 (N/A) TRANSESOPHAGEAL ECHOCARDIOGRAM (TEE) (N/A)  1. CV- NSR with PVCs, + HTN- will continue Coreg at 25 mg BID which is home dose, increase Cozaar to 100 mg daily which is home regimen, continue Plavix, ASA for ACS 2. Pulm- chest tube, no air leak, 50 cc output... Will review CXR is no pneumothorax will d/c chest tube today.. Not on oxygen, continue IS 3. Renal-creatinine has been stable, weight is trending down... Not currently on Lasix, discussed need with Dr. [name]  4. LOC constipation, will order Lactulose 5. Expected post operative blood loss anemia, mild Hgb 9.9 6. DM- sugars mildly elevated, will resume home insulin medications, preoperative A1c 7.2 7. Dispo- patient looks good, will increase Cozaar for HTN, restarted home diabetes medications, will d/c EPW, lasix need will be evaluated by Dr. [name].... Likely home tomorrow   LOS: 9 days    BARRETT, ERIN 01/09/2017   I have seen and examined the patient and agree with  the assessment and plan as outlined.  Looks good and reports feeling well.  Ambulating very well w/out assistance.  Appetite marginal but improving.  No BM since surgery but passing flatus.  Hopefully ready for d/c home tomorrow.  [name], MD 01/09/2017 7:59 AM

## 2017-01-09 NOTE — Progress Notes (Signed)
EPW d/c'd per order and per protocol. VSS. Tips intact. Pt educated on need for q6m vitals and bedrest x1h. Call bell and phone within reach. Will continue to monitor.

## 2017-01-09 NOTE — Care Management Important Message (Signed)
Important Message  Patient Details  Name: William Orr MRN: [id number] Date of Birth: 04-22-1946   Medicare Important Message Given:  Yes    Nathen May 01/09/2017, 12:19 PM

## 2017-01-09 NOTE — Progress Notes (Signed)
CT d/c'd per order and per protocol. Pt tolerated well. Occlusive sterile dressing applied. Pt educated to let RN know if dressing felt wet. Call bell and phone within reach. Will continue to monitor.

## 2017-01-09 NOTE — Progress Notes (Signed)
CARDIAC REHAB PHASE I   PRE:  Rate/Rhythm: 74 SR PVCs  BP:  Supine: 176/81  Sitting:   Standing:    SaO2: 96%RA  MODE:  Ambulation: 320 ft   POST:  Rate/Rhythm: 86 SR PVCs  BP:  Supine:   Sitting:   Standing: 176/72   SaO2: 96%RA 0835-0905 Pt walked 320 ft on RA with rolling walker and minimal asst. Would like rolling walker for home use. Tolerated well. To recliner after walk. Frequent PVSc.   Graylon Good, RN BSN  01/09/2017 9:01 AM

## 2017-01-10 ENCOUNTER — Inpatient Hospital Stay (HOSPITAL_COMMUNITY): Payer: Medicare Other

## 2017-01-10 LAB — GLUCOSE, CAPILLARY: GLUCOSE-CAPILLARY: 157 mg/dL — AB (ref 65–99)

## 2017-01-10 MED ORDER — CLOPIDOGREL BISULFATE 75 MG PO TABS: 75.0000 mg | ORAL_TABLET | Freq: Every day | ORAL | 3 refills | Status: DC

## 2017-01-10 MED ORDER — ASPIRIN 81 MG PO TBEC: 81.0000 mg | DELAYED_RELEASE_TABLET | Freq: Every day | ORAL | Status: AC

## 2017-01-10 MED ORDER — TRAMADOL HCL 50 MG PO TABS: 50.0000 mg | ORAL_TABLET | ORAL | 0 refills | Status: DC | PRN

## 2017-01-10 MED ORDER — DILTIAZEM HCL ER COATED BEADS 180 MG PO CP24
300.0000 mg | ORAL_CAPSULE | Freq: Every day | ORAL | Status: DC
  Administered 2017-01-10: 300 mg via ORAL
  Filled 2017-01-10: qty 1

## 2017-01-10 MED ORDER — ROSUVASTATIN CALCIUM 20 MG PO TABS: 20.0000 mg | ORAL_TABLET | Freq: Every day | ORAL | 3 refills | Status: DC

## 2017-01-10 MED ORDER — ACETAMINOPHEN 325 MG PO TABS: 650.0000 mg | ORAL_TABLET | Freq: Four times a day (QID) | ORAL | Status: DC | PRN

## 2017-01-10 MED ORDER — DILTIAZEM HCL ER COATED BEADS 180 MG PO CP24: 360.0000 mg | ORAL_CAPSULE | Freq: Every day | ORAL | Status: DC

## 2017-01-10 NOTE — Progress Notes (Signed)
The patient is being discharged, I agree with BP management. We will arrange for an outpatient follow up with Dr Claiborne Billings.  Ena Dawley, MD 01/10/2017

## 2017-01-10 NOTE — Progress Notes (Signed)
Referral made to [facility] for RW to be delivered to room prior to discharge.

## 2017-01-10 NOTE — Progress Notes (Signed)
CT sutures d/c'd per order and per protocol. Painted with tincture and steri-strips applied.

## 2017-01-10 NOTE — Progress Notes (Signed)
      [address]       [place] [address]             [phone]      6 Days Post-Op Procedure(s) (LRB): CORONARY ARTERY BYPASS GRAFTING (CABG)X3, ON PUMP, USING LEFT INTERNAL MAMMARY ARTERY AND RIGHT GREATER SAPHENOUS VEIN HARVESTED ENDOSCOPICALLY, LIMA-LAD, SEQ SVG- DIAG 1 -DIAG 2 (N/A) TRANSESOPHAGEAL ECHOCARDIOGRAM (TEE) (N/A)   Subjective:  Patient complains of being sweaty this morning.  He continues to have some incisional discomfort which is better after chest tube removal, he denies nausea and vomiting. + ambulation  + BM  Objective: Vital signs in last 24 hours: Temp:  [98.1 F (36.7 C)-98.2 F (36.8 C)] 98.1 F (36.7 C) (05/30 0451) Pulse Rate:  [69-70] 69 (05/30 0451) Cardiac Rhythm: Normal sinus rhythm (05/29 1924) Resp:  [18] 18 (05/30 0451) BP: (123-177)/(52-58) 123/52 (05/30 0451) SpO2:  [92 %-93 %] 93 % (05/30 0451) Weight:  [243 lb 12.8 oz (110.6 kg)] 243 lb 12.8 oz (110.6 kg) (05/30 0451)  Intake/Output from previous day: 05/29 0701 - 05/30 0700 In: 720 [P.O.:720] Out: 1200 [Urine:1200]  General appearance: alert, cooperative and no distress Heart: regular rate and rhythm Lungs: diminished breath sounds bibasilar Abdomen: soft, non-tender; bowel sounds normal; no masses,  no organomegaly Extremities: edema trace Wound: clean and dry  Lab Results:  Recent Labs  01/08/17 0215 01/09/17 0332  WBC 7.4 5.7  HGB 9.3* 9.9*  HCT 29.0* 30.4*  PLT 212 268   BMET:  Recent Labs  01/08/17 0215 01/09/17 0332  NA 132* 136  K 3.9 3.6  CL 99* 101  CO2 26 26  GLUCOSE 171* 136*  BUN 12 10  CREATININE 0.81 0.78  CALCIUM 8.6* 8.9    PT/INR: No results for input(s): LABPROT, INR in the last 72 hours. ABG    Component Value Date/Time   PHART 7.358 01/04/2017 1743   HCO3 24.1 01/04/2017 1743   TCO2 26 01/05/2017 1621   ACIDBASEDEF 1.0 01/04/2017 1743   O2SAT 99.0 01/04/2017 1743   CBG (last 3)   Recent Labs  01/09/17 1609  01/09/17 2050 01/10/17 0620  GLUCAP 169* 162* 157*    Assessment/Plan: S/P Procedure(s) (LRB): CORONARY ARTERY BYPASS GRAFTING (CABG)X3, ON PUMP, USING LEFT INTERNAL MAMMARY ARTERY AND RIGHT GREATER SAPHENOUS VEIN HARVESTED ENDOSCOPICALLY, LIMA-LAD, SEQ SVG- DIAG 1 -DIAG 2 (N/A) TRANSESOPHAGEAL ECHOCARDIOGRAM (TEE) (N/A)  1. CV- NSR with PVC, + HTN- continue Coreg, Cozaar, increase Cardizem to help with additional BP control 2. Pulm- no acute issues, continue IS 3. Renal- creatinine, weight is stable 4. DM- sugars controlled, continue home regimen 5. Dispo- patient stable, increase Cardizem for additional BP control, will plan to d/c home today   LOS: 9 days    Keslie Gritz 01/10/2017

## 2017-01-10 NOTE — Progress Notes (Signed)
[id number] Education completed with pt who voiced understanding. Discussed CRP 2 and will refer to [facility]. Discussed heart healthy and counting carbs- gave handouts. Reviewed sternal precautions and IS. Put on discharge video for him to view. To chair. Discussed walking for ex. Graylon Good RN BSN 01/10/2017 10:02 AM

## 2017-01-15 DIAGNOSIS — Z79899 Other long term (current) drug therapy: Secondary | ICD-10-CM | POA: Diagnosis not present

## 2017-01-15 DIAGNOSIS — R0789 Other chest pain: Secondary | ICD-10-CM | POA: Diagnosis not present

## 2017-01-15 DIAGNOSIS — E7800 Pure hypercholesterolemia, unspecified: Secondary | ICD-10-CM | POA: Diagnosis not present

## 2017-01-15 DIAGNOSIS — E119 Type 2 diabetes mellitus without complications: Secondary | ICD-10-CM | POA: Diagnosis not present

## 2017-01-15 DIAGNOSIS — Z7984 Long term (current) use of oral hypoglycemic drugs: Secondary | ICD-10-CM | POA: Diagnosis not present

## 2017-01-15 DIAGNOSIS — I10 Essential (primary) hypertension: Secondary | ICD-10-CM | POA: Diagnosis not present

## 2017-01-15 DIAGNOSIS — Z951 Presence of aortocoronary bypass graft: Secondary | ICD-10-CM | POA: Diagnosis not present

## 2017-01-17 DIAGNOSIS — E119 Type 2 diabetes mellitus without complications: Secondary | ICD-10-CM | POA: Diagnosis not present

## 2017-01-17 DIAGNOSIS — E7800 Pure hypercholesterolemia, unspecified: Secondary | ICD-10-CM | POA: Diagnosis not present

## 2017-01-17 DIAGNOSIS — I10 Essential (primary) hypertension: Secondary | ICD-10-CM | POA: Diagnosis not present

## 2017-01-17 DIAGNOSIS — M79604 Pain in right leg: Secondary | ICD-10-CM | POA: Diagnosis not present

## 2017-01-17 DIAGNOSIS — Z79899 Other long term (current) drug therapy: Secondary | ICD-10-CM | POA: Diagnosis not present

## 2017-01-17 DIAGNOSIS — Z7984 Long term (current) use of oral hypoglycemic drugs: Secondary | ICD-10-CM | POA: Diagnosis not present

## 2017-01-18 ENCOUNTER — Encounter: Payer: Self-pay | Admitting: Physician Assistant

## 2017-01-18 ENCOUNTER — Ambulatory Visit (INDEPENDENT_AMBULATORY_CARE_PROVIDER_SITE_OTHER): Payer: Medicare Other | Admitting: Physician Assistant

## 2017-01-18 VITALS — BP 112/58 | HR 56 | Ht 74.0 in | Wt 238.0 lb

## 2017-01-18 DIAGNOSIS — E785 Hyperlipidemia, unspecified: Secondary | ICD-10-CM | POA: Diagnosis not present

## 2017-01-18 DIAGNOSIS — Z79899 Other long term (current) drug therapy: Secondary | ICD-10-CM

## 2017-01-18 DIAGNOSIS — E7800 Pure hypercholesterolemia, unspecified: Secondary | ICD-10-CM

## 2017-01-18 DIAGNOSIS — I25810 Atherosclerosis of coronary artery bypass graft(s) without angina pectoris: Secondary | ICD-10-CM

## 2017-01-18 DIAGNOSIS — I10 Essential (primary) hypertension: Secondary | ICD-10-CM

## 2017-01-18 DIAGNOSIS — E119 Type 2 diabetes mellitus without complications: Secondary | ICD-10-CM | POA: Diagnosis not present

## 2017-01-18 MED ORDER — SIMVASTATIN 20 MG PO TABS: 20.0000 mg | ORAL_TABLET | Freq: Every day | ORAL | 5 refills | Status: DC

## 2017-01-18 MED ORDER — OMEGA-3-ACID ETHYL ESTERS 1 G PO CAPS: 1.0000 g | ORAL_CAPSULE | Freq: Two times a day (BID) | ORAL | 5 refills | Status: DC

## 2017-01-18 NOTE — Progress Notes (Signed)
Cardiology Office Note    Date:  01/18/2017   ID:  William Orr, DOB March 12, 1946, MRN [id number]  PCP:  [name], MD  Cardiologist:  [name]   Chief Complaint  Patient presents with  . Follow-up    Post CABG. Seen for [name]    History of Present Illness:  William Orr is a 71 y.o. male with PMH of HTN, HLD, DM II and OSA on CPAP who presents today after recently diagnosed with CAD s/p CABG. He had an exercise nuclear stress test in [place] in 2014 for atypical chest pain which revealed normal perfusion with EF 61%. He was last seen by [name] on 12/31/2015, one year follow-up was recommended. He woke up in the morning of 12/31/2016 was burning sensation in the chest. He went to [facility] and initial troponin was mildly elevated. He was subsequently transferred to [facility], troponin peaked at 1.03. Lipid panel showed triglyceride was 413, LDL was unable to be calculated. Echocardiogram obtained on 01/01/2017 showed EF 26-83%, grade 2 diastolic dysfunction. He underwent cardiac catheterization on 01/02/2017, this revealed multifocal high grade disease involving the proximal and mid LAD and also bifurcation, 80-85% ostial to proximal LAD lesion, 50-70% D2 lesion. No significant disease in left circumflex and RCA. Cardiothoracic surgery was consulted. Carotid Doppler obtained on 01/03/2017 showed 40-45% left ICA stenosis, 1-39% right ICA stenosis. He was seen by Dr. [name] and eventually underwent LIMA to LAD and sequential SVG to D1 and D2 on 01/04/2017. Intraoperative TEE continued to show EF 60-65%, trace pulmonic regurgitation.  Patient presents today for follow-up. Since his discharge, he has been to [facility] ED twice. The first time for chest pain. EKG was negative according to the patient. He says he had a twinge in his chest that is different following the previous angina. He also went back to ED for leg cramps, which he attributed to the Crestor. He has since  discontinued Crestor. I went over his recent panel with him, we will start on Lovaza 1000 mg twice a day. I will also add Zocor 20 mg daily. It is very possible he will have myalgia again was Zocor as well. I plan to obtain a fasting lipid panel and LFTs in 6 weeks. I will also refer him to our lipid clinic in 6 weeks as well. He can follow-up with [name] in 2-3 months. Overall I think he is doing very well, he can have the previously scheduled family reunion near the end of June at which time he should have recovered further. As far as his driving ability, I will defer to cardiothoracic surgery to decide. He will also start cardiac rehabilitation nurse at the end of this month as well.   Past Medical History:  Diagnosis Date  . Diabetes mellitus without complication ([place])   . History of hiatal hernia   . Hyperlipidemia   . Hypertension   . S/P CABG x 3 01/04/2017   LIMA to LAD, Sequential SVG to D1-D2, EVH via right thigh    Past Surgical History:  Procedure Laterality Date  . CORONARY ARTERY BYPASS GRAFT N/A 01/04/2017   Procedure: CORONARY ARTERY BYPASS GRAFTING (CABG)X3, ON PUMP, USING LEFT INTERNAL MAMMARY ARTERY AND RIGHT GREATER SAPHENOUS VEIN HARVESTED ENDOSCOPICALLY, LIMA-LAD, SEQ SVG- DIAG 1 -DIAG 2;  Surgeon: [name], MD;  Location: [place] [place];  Service: Open Heart Surgery;  Laterality: N/A;  . KNEE ARTHROSCOPY Bilateral   . LEFT HEART CATH AND CORONARY ANGIOGRAPHY  N/A 01/02/2017   Procedure: Left Heart Cath and Coronary Angiography;  Surgeon: [name], MD;  Location: [facility];  Service: Cardiovascular;  Laterality: N/A;  . TEE WITHOUT CARDIOVERSION N/A 01/04/2017   Procedure: TRANSESOPHAGEAL ECHOCARDIOGRAM (TEE);  Surgeon: [name], MD;  Location: [place];  Service: Open Heart Surgery;  Laterality: N/A;    Current Medications: Outpatient Medications Prior to Visit  Medication Sig Dispense Refill  . acetaminophen (TYLENOL) 325 MG tablet Take 2 tablets  (650 mg total) by mouth every 6 (six) hours as needed for mild pain or moderate pain.    . Alpha-Lipoic Acid 600 MG CAPS Take 600 mg by mouth daily.     [name] aspirin EC 81 MG EC tablet Take 1 tablet (81 mg total) by mouth daily.    . carvedilol (COREG) 25 MG tablet Take 25 mg by mouth 2 (two) times daily.     . Cholecalciferol (VITAMIN D-3) 5000 UNITS TABS Take 10,000 Units by mouth daily.     . clopidogrel (PLAVIX) 75 MG tablet Take 1 tablet (75 mg total) by mouth daily. 30 tablet 3  . diclofenac sodium (VOLTAREN) 1 % GEL Apply 1 application topically 4 (four) times daily as needed (pain).    [name] diltiazem (TIAZAC) 300 MG 24 hr capsule Take 300 mg by mouth at bedtime.     . fluticasone (FLONASE) 50 MCG/ACT nasal spray Place 1 spray into both nostrils daily as needed (seasonal allergies).     [name] glipiZIDE (GLUCOTROL) 10 MG tablet Take 15 mg by mouth 2 (two) times daily.     [name] losartan (COZAAR) 100 MG tablet Take 100 mg by mouth daily.    . metFORMIN (GLUCOPHAGE) 850 MG tablet Take 850 mg by mouth 2 (two) times daily with a meal.     . Multiple Vitamin (MULTIVITAMIN WITH MINERALS) TABS tablet Take 2 tablets by mouth daily.    [name] OVER THE COUNTER MEDICATION Take 1 tablet by mouth 2 (two) times daily. Super Beta Prostate    . OVER THE COUNTER MEDICATION Take 2 capsules by mouth daily. Krill Oil with CoQ10    . ranitidine (ZANTAC) 150 MG tablet Take 150 mg by mouth daily before breakfast.    . sodium chloride (OCEAN) 0.65 % SOLN nasal spray Place 1 spray into both nostrils 2 (two) times daily as needed for congestion.    . traMADol (ULTRAM) 50 MG tablet Take 1-2 tablets (50-100 mg total) by mouth every 4 (four) hours as needed for moderate pain. 30 tablet 0  . rosuvastatin (CRESTOR) 20 MG tablet Take 1 tablet (20 mg total) by mouth daily at 6 PM. 30 tablet 3   No facility-administered medications prior to visit.      Allergies:   Lisinopril   Social History   Social History  . Marital status:  Married    Spouse name: N/A  . Number of children: N/A  . Years of education: N/A   Social History Main Topics  . Smoking status: Former Smoker    Types: Pipe  . Smokeless tobacco: Never Used     Comment: quit smoking pipe 30 years ago.  . Alcohol use No  . Drug use: No  . Sexual activity: Not Currently   Other Topics Concern  . None   Social History Narrative  . None     Family History:  The patient's family history includes Arthritis in his brother; Cancer - Other in his brother; Cancer - Prostate  in his father; Diabetes in his brother and mother; Diabetes type II in his son; Rheum arthritis in his brother and sister.   ROS:   Please see the history of present illness.    ROS All other systems reviewed and are negative.   PHYSICAL EXAM:   VS:  BP (!) 112/58   Pulse (!) 56   Ht 6\' 2"  (1.88 m)   Wt 238 lb (108 kg)   BMI 30.56 kg/m    GEN: Well nourished, well developed, in no acute distress  HEENT: normal  Neck: no JVD, carotid bruits, or masses Cardiac: RRR; no murmurs, rubs, or gallops,no edema  Respiratory:  clear to auscultation bilaterally, normal work of breathing GI: soft, nontender, nondistended, + BS MS: no deformity or atrophy  Skin: warm and dry, no rash Neuro:  Alert and Oriented x 3, Strength and sensation are intact Psych: euthymic mood, full affect  Wt Readings from Last 3 Encounters:  01/18/17 238 lb (108 kg)  01/10/17 243 lb 12.8 oz (110.6 kg)  12/31/15 250 lb 9.6 oz (113.7 kg)      Studies/Labs Reviewed:   EKG:  EKG is not ordered today.   Recent Labs: 01/03/2017: ALT 39 01/05/2017: Magnesium 1.9 01/09/2017: BUN 10; Creatinine, Ser 0.78; Hemoglobin 9.9; Platelets 268; Potassium 3.6; Sodium 136   Lipid Panel    Component Value Date/Time   CHOL 116 01/01/2017 0042   CHOL 147 08/27/2015 0955   TRIG 413 (H) 01/01/2017 0042   HDL 26 (L) 01/01/2017 0042   HDL 27 (L) 08/27/2015 0955   CHOLHDL 4.5 01/01/2017 0042   VLDL UNABLE TO CALCULATE  IF TRIGLYCERIDE OVER 400 mg/dL 01/01/2017 0042   LDLCALC UNABLE TO CALCULATE IF TRIGLYCERIDE OVER 400 mg/dL 01/01/2017 0042   LDLCALC 53 08/27/2015 0955    Additional studies/ records that were reviewed today include:   Echo 01/01/2017 LV EF: 55% -   60%  Study Conclusions  - Left ventricle: The cavity size was normal. There was mild focal   basal hypertrophy of the septum. Systolic function was normal.   The estimated ejection fraction was in the range of 55% to 60%.   Wall motion was normal; there were no regional wall motion   abnormalities. Features are consistent with a pseudonormal left   ventricular filling pattern, with concomitant abnormal relaxation   and increased filling pressure (grade 2 diastolic dysfunction). - Left atrium: The atrium was moderately dilated.   Cath 01/02/2017 Conclusion    Unstable angina pectoris/non-ST elevation MI  Multifocal high-grade disease involving the proximal and mid LAD (tandem lesions). The LAD gives origin to 2 large diagonals, the first of which contains ostial to proximal 80-85% stenosis and forms a [place] 011 bifurcation lesion with the proximal LAD. The second larger diagonal contains 50-70% mid stenosis.  Circumflex is widely patent without significant stenosis.  Right coronary is dominant with a large PDA which contains luminal irregularities but no high-grade obstruction.  Normal left ventricular function. Left ventricular end-diastolic pressure is upper normal.  RECOMMENDATION:   CABG with LIMA to LAD and diagonals versus higher risk multilesion PCI. Have notified [name] (his primary cardiologist) and also asked for TCTS consult    Carotid doppler 01/03/2017 - The vertebral arteries appear patent with antegrade flow. - Right 1-39% ICA stenosis. Left 40-59% ICA stenosis, based on   velocity.   CABG by Dr. [name] 01/04/2017 CORONARY ARTERY BYPASS GRAFTING x 3 -LIMA to LAD -SEQ SVG to DIAGONAL 1 and DIAGONAL  2   Intraoperative TEE 01/04/2017   Left ventricle: Normal cavity size. Concentric hypertrophy of moderate severity. LV systolic function is normal with an EF of 60-65%. No thrombus present. No mass present.  Right ventricle: Normal wall thickness and ejection fraction. Cavity is mildly dilated. No thrombus present. No mass present.  Pulmonic valve: Trace regurgitation.   ASSESSMENT:    1. Coronary artery disease involving coronary bypass graft of native heart without angina pectoris   2. Pure hypercholesterolemia   3. Hyperlipidemia with target LDL less than 70   4. Encounter for long-term (current) use of medications   5. Essential hypertension   6. Controlled type 2 diabetes mellitus without complication, without long-term current use of insulin (HCC)      PLAN:  In order of problems listed above:  1. CAD s/p 3 vessel CABG with LIMA to LAD, sequential SVG to D1 and D2. Recent cath revealed normal left circumflex and RCA. No significant chest discomfort on today's visit. He has been started on aspirin and Plavix.  2. Hyperlipidemia: His triglyceride was over 300. He has discontinued his current Crestor due to leg cramps. I have started him on a trial of Zocor and Lovenox. I have also referred him to our lipid clinic. He will need a fasting lipid panel and LFT in 6 weeks.  3. Hypertension: Blood pressure well controlled on carvedilol, diltiazem and losartan.  4. DM 2: Continue glipizide.    Medication Adjustments/Labs and Tests Ordered: Current medicines are reviewed at length with the patient today.  Concerns regarding medicines are outlined above.  Medication changes, Labs and Tests ordered today are listed in the Patient Instructions below. Patient Instructions  Medication Instructions:   STOP crestor STOP generic fish oil  START zocor 20mg  ONCE DAILY AT SUPPER START lovaza 1g TWICE DAILY  Labwork:   Fasting lipid and liver function studies in 6 weeks  ([facility])   Testing/Procedures:  none  Follow-Up:  6 weeks with Lipid Clinic  2-3 months with [name]    If you need a refill on your cardiac medications before your next appointment, please call your pharmacy.      Hilbert Corrigan, [place]  01/18/2017 [facility] [place], [place], [place]  [address] Phone: [phone]; Fax: [phone]

## 2017-01-18 NOTE — Patient Instructions (Signed)
Medication Instructions:   STOP crestor STOP generic fish oil  START zocor 20mg  ONCE DAILY AT SUPPER START lovaza 1g TWICE DAILY  Labwork:   Fasting lipid and liver function studies in 6 weeks ([facility])   Testing/Procedures:  none  Follow-Up:  6 weeks with Lipid Clinic  2-3 months with [name]    If you need a refill on your cardiac medications before your next appointment, please call your pharmacy.

## 2017-01-26 ENCOUNTER — Emergency Department (HOSPITAL_COMMUNITY)
Admission: EM | Admit: 2017-01-26 | Discharge: 2017-01-27 | Disposition: A | Discharge status: Home / Self Care | Payer: Medicare Other | Attending: Emergency Medicine | Admitting: Emergency Medicine

## 2017-01-26 ENCOUNTER — Encounter (HOSPITAL_COMMUNITY): Payer: Self-pay

## 2017-01-26 DIAGNOSIS — E119 Type 2 diabetes mellitus without complications: Secondary | ICD-10-CM | POA: Insufficient documentation

## 2017-01-26 DIAGNOSIS — I2510 Atherosclerotic heart disease of native coronary artery without angina pectoris: Secondary | ICD-10-CM | POA: Insufficient documentation

## 2017-01-26 DIAGNOSIS — Z79899 Other long term (current) drug therapy: Secondary | ICD-10-CM | POA: Diagnosis not present

## 2017-01-26 DIAGNOSIS — Z7984 Long term (current) use of oral hypoglycemic drugs: Secondary | ICD-10-CM | POA: Insufficient documentation

## 2017-01-26 DIAGNOSIS — M79604 Pain in right leg: Secondary | ICD-10-CM | POA: Insufficient documentation

## 2017-01-26 DIAGNOSIS — Z6830 Body mass index (BMI) 30.0-30.9, adult: Secondary | ICD-10-CM | POA: Diagnosis not present

## 2017-01-26 DIAGNOSIS — G4730 Sleep apnea, unspecified: Secondary | ICD-10-CM | POA: Diagnosis not present

## 2017-01-26 DIAGNOSIS — Z713 Dietary counseling and surveillance: Secondary | ICD-10-CM | POA: Diagnosis not present

## 2017-01-26 DIAGNOSIS — E785 Hyperlipidemia, unspecified: Secondary | ICD-10-CM | POA: Diagnosis not present

## 2017-01-26 DIAGNOSIS — Z951 Presence of aortocoronary bypass graft: Secondary | ICD-10-CM | POA: Insufficient documentation

## 2017-01-26 DIAGNOSIS — Z7982 Long term (current) use of aspirin: Secondary | ICD-10-CM | POA: Diagnosis not present

## 2017-01-26 DIAGNOSIS — I10 Essential (primary) hypertension: Secondary | ICD-10-CM | POA: Insufficient documentation

## 2017-01-26 DIAGNOSIS — Z87891 Personal history of nicotine dependence: Secondary | ICD-10-CM | POA: Insufficient documentation

## 2017-01-26 DIAGNOSIS — E7800 Pure hypercholesterolemia, unspecified: Secondary | ICD-10-CM | POA: Diagnosis not present

## 2017-01-26 DIAGNOSIS — M79606 Pain in leg, unspecified: Secondary | ICD-10-CM | POA: Diagnosis not present

## 2017-01-26 DIAGNOSIS — Z7902 Long term (current) use of antithrombotics/antiplatelets: Secondary | ICD-10-CM | POA: Diagnosis not present

## 2017-01-26 DIAGNOSIS — E1165 Type 2 diabetes mellitus with hyperglycemia: Secondary | ICD-10-CM | POA: Diagnosis not present

## 2017-01-26 DIAGNOSIS — M79651 Pain in right thigh: Secondary | ICD-10-CM | POA: Diagnosis not present

## 2017-01-26 NOTE — ED Triage Notes (Signed)
Pt complaining of increased pain and swelling to R medial knee. Pt states had heart bypass surgery 24th May. Pt denies any chest pain, SOB or cough. Pt denies any fevers or chills. Pt with some tenderness to incision site but, no drainage, no redness.

## 2017-01-27 ENCOUNTER — Ambulatory Visit (HOSPITAL_BASED_OUTPATIENT_CLINIC_OR_DEPARTMENT_OTHER)
Admission: RE | Admit: 2017-01-27 | Discharge: 2017-01-27 | Disposition: A | Discharge status: Home / Self Care | Payer: Medicare Other | Source: Ambulatory Visit | Attending: Emergency Medicine | Admitting: Emergency Medicine

## 2017-01-27 DIAGNOSIS — M79604 Pain in right leg: Secondary | ICD-10-CM | POA: Diagnosis not present

## 2017-01-27 DIAGNOSIS — M79609 Pain in unspecified limb: Secondary | ICD-10-CM | POA: Diagnosis not present

## 2017-01-27 DIAGNOSIS — M7989 Other specified soft tissue disorders: Secondary | ICD-10-CM

## 2017-01-27 LAB — HEPATIC FUNCTION PANEL
ALK PHOS: 80 IU/L (ref 39–117)
ALT: 32 IU/L (ref 0–44)
AST: 25 IU/L (ref 0–40)
Albumin: 4.3 g/dL (ref 3.6–4.8)
Bilirubin Total: <0.2 mg/dL (ref 0.0–1.2)
Bilirubin, Direct: 0.09 mg/dL (ref 0.00–0.40)
Total Protein: 7.4 g/dL (ref 6.0–8.5)

## 2017-01-27 LAB — LIPID PANEL
Chol/HDL Ratio: 2.9 ratio (ref 0.0–5.0)
Cholesterol, Total: 87 mg/dL — ABNORMAL LOW (ref 100–199)
HDL: 30 mg/dL — AB (ref >39)
LDL Calculated: 34 mg/dL (ref 0–99)
Triglycerides: 117 mg/dL (ref 0–149)
VLDL CHOLESTEROL CAL: 23 mg/dL (ref 5–40)

## 2017-01-27 NOTE — Discharge Instructions (Signed)
PLEASE RETURN TO ER AND TELL THEM YOU NEED ULTRASOUND FOR A BLOOD CLOT IN YOUR RIGHT LEG HAVE A GOOD NIGHT

## 2017-01-27 NOTE — Progress Notes (Signed)
Triglyceride significantly improved. Lowered from 413 down to 117. HDL (good cholesterol) still low. But LDL (bad cholesterol) and total cholesterol are well controlled

## 2017-01-27 NOTE — ED Provider Notes (Signed)
[facility] DEPT Provider Note   CSN: [id number] Arrival date & time: 01/26/17  1900  By signing my name below, I, Jaquelyn Bitter., attest that this documentation has been prepared under the direction and in the presence of Ripley Fraise, MD. Electronically signed: Jaquelyn Bitter., ED Scribe. 01/27/17. 12:31 AM.   History   Chief Complaint Chief Complaint  Patient presents with  . Post-op Problem  Patient gave verbal permission to utilize photo for medical documentation only The image was not stored on any personal device   HPI William Orr is a 71 y.o. male with recent hx of triple bypass sx (01/04/17) who presents to the Emergency Department complaining of constant, worsening R knee pain with onset x1 day. He reportedly developed R leg swelling and pain in the past x1 day that he describes as sharp. Pt was seen by his PCP today and was referred to the ED. He states that he developed the pain in the past while taking Crestor and Zocor so he discontinued use. He states that he now has numbness in the R thigh. Pt reports chest tenderness that he describes as pinching from recent surgery. He denies any modifying factors. Pt denies fever, vomiting, SOB, abdominal pain, back pain, blood in stool, hematemesis. Pt denies recent injury, fall or trauma. He denies recent long distance travel, hx of DVT.    The history is provided by the patient. No language interpreter was used.    Past Medical History:  Diagnosis Date  . Diabetes mellitus without complication ([place])   . History of hiatal hernia   . Hyperlipidemia   . Hypertension   . S/P CABG x 3 01/04/2017   LIMA to LAD, Sequential SVG to D1-D2, EVH via right thigh    Patient Active Problem List   Diagnosis Date Noted  . S/P CABG x 3 01/04/2017  . Coronary artery disease involving native coronary artery of native heart with unstable angina pectoris ([place])   . Pure hypercholesterolemia   . Chest pain 12/31/2016   . Evaluate for OSA (obstructive sleep apnea) 10/19/2013  . Hiatal hernia 10/19/2013  . Atypical chest pain 09/15/2013  . DM2 (diabetes mellitus, type 2) ([place]) 09/15/2013  . HTN (hypertension) 09/15/2013  . Hyperlipidemia with target LDL less than 70 09/15/2013    Past Surgical History:  Procedure Laterality Date  . CORONARY ARTERY BYPASS GRAFT N/A 01/04/2017   Procedure: CORONARY ARTERY BYPASS GRAFTING (CABG)X3, ON PUMP, USING LEFT INTERNAL MAMMARY ARTERY AND RIGHT GREATER SAPHENOUS VEIN HARVESTED ENDOSCOPICALLY, LIMA-LAD, SEQ SVG- DIAG 1 -DIAG 2;  Surgeon: [name], MD;  Location: [facility];  Service: Open Heart Surgery;  Laterality: N/A;  . KNEE ARTHROSCOPY Bilateral   . LEFT HEART CATH AND CORONARY ANGIOGRAPHY N/A 01/02/2017   Procedure: Left Heart Cath and Coronary Angiography;  Surgeon: [name], MD;  Location: [facility];  Service: Cardiovascular;  Laterality: N/A;  . TEE WITHOUT CARDIOVERSION N/A 01/04/2017   Procedure: TRANSESOPHAGEAL ECHOCARDIOGRAM (TEE);  Surgeon: [name], MD;  Location: [place] [facility];  Service: Open Heart Surgery;  Laterality: N/A;       Home Medications    Prior to Admission medications   Medication Sig Start Date End Date Taking? Authorizing Provider  acetaminophen (TYLENOL) 325 MG tablet Take 2 tablets (650 mg total) by mouth every 6 (six) hours as needed for mild pain or moderate pain. 01/10/17  Yes Barrett, Erin R, PA-C  Alpha-Lipoic Acid 600 MG CAPS Take 600  mg by mouth daily.    Yes [provider]  aspirin EC 81 MG EC tablet Take 1 tablet (81 mg total) by mouth daily. 01/10/17  Yes Barrett, Erin R, PA-C  carvedilol (COREG) 25 MG tablet Take 25 mg by mouth 2 (two) times daily.  08/22/13  Yes [provider]  Cholecalciferol (VITAMIN D-3) 5000 UNITS TABS Take 10,000 Units by mouth daily.    Yes [provider]  clopidogrel (PLAVIX) 75 MG tablet Take 1 tablet (75 mg total) by mouth daily. 01/10/17  Yes Barrett,  Erin R, PA-C  diltiazem (TIAZAC) 300 MG 24 hr capsule Take 300 mg by mouth at bedtime.    Yes [provider]  fluticasone (FLONASE) 50 MCG/ACT nasal spray Place 1 spray into both nostrils daily as needed (seasonal allergies).  08/11/13  Yes [provider]  glipiZIDE (GLUCOTROL) 10 MG tablet Take 15 mg by mouth 2 (two) times daily.  02/22/14  Yes [provider]  losartan (COZAAR) 100 MG tablet Take 100 mg by mouth daily.   Yes [provider]  metFORMIN (GLUCOPHAGE) 850 MG tablet Take 850 mg by mouth 2 (two) times daily with a meal.  08/19/13  Yes [provider]  Multiple Vitamin (MULTIVITAMIN WITH MINERALS) TABS tablet Take 2 tablets by mouth daily.   Yes [provider]  NONFORMULARY OR COMPOUNDED ITEM Apply topically 4 (four) times daily as needed (for pain). Voltaren/Lidocaine Gel   Yes [provider]  omega-3 acid ethyl esters (LOVAZA) 1 g capsule Take 1 capsule (1 g total) by mouth 2 (two) times daily. 01/18/17  Yes Meng, Isaac Laud, PA  OVER THE COUNTER MEDICATION Take 1 tablet by mouth 2 (two) times daily. Super Beta Prostate   Yes [provider]  ranitidine (ZANTAC) 150 MG tablet Take 150 mg by mouth daily before breakfast.   Yes [provider]  sodium chloride (OCEAN) 0.65 % SOLN nasal spray Place 1 spray into both nostrils 2 (two) times daily as needed for congestion.   Yes [provider]  traMADol (ULTRAM) 50 MG tablet Take 1-2 tablets (50-100 mg total) by mouth every 4 (four) hours as needed for moderate pain. 01/10/17  Yes Barrett, Erin R, PA-C  simvastatin (ZOCOR) 20 MG tablet Take 1 tablet (20 mg total) by mouth at bedtime. Patient not taking: Reported on 01/27/2017 01/18/17 04/18/17  Almyra Deforest, PA    Family History Family History  Problem Relation Age of Onset  . Diabetes Mother   . Cancer - Prostate Father   . Rheum arthritis Sister   . Diabetes Brother   . Arthritis Brother   . Cancer - Other  Brother        pancreatic  . Rheum arthritis Brother   . Diabetes type II Son     Social History Social History  Substance Use Topics  . Smoking status: Former Smoker    Types: Pipe  . Smokeless tobacco: Never Used     Comment: quit smoking pipe 30 years ago.  . Alcohol use No     Allergies   Lisinopril   Review of Systems Review of Systems  All other systems reviewed and are negative.     Physical Exam Updated Vital Signs BP (!) 147/70 (BP Location: Right Arm)   Pulse 74   Temp 98.6 F (37 C) (Oral)   Resp 18   SpO2 100%   Physical Exam  CONSTITUTIONAL: Well developed/well nourished HEAD: Normocephalic/atraumatic EYES: EOMI/PERRL ENMT: Mucous membranes  moist NECK: supple no meningeal signs SPINE/BACK:entire spine nontender CV: S1/S2 noted, no murmurs/rubs/gallops noted LUNGS: Lungs are clear to auscultation bilaterally, no apparent distress ABDOMEN: soft, nontender, no rebound or guarding, bowel sounds noted throughout abdomen GU:no cva tenderness NEURO: Pt is awake/alert/appropriate, moves all extremitiesx4.  No facial droop.  No focal weakness in the R lower extremity. EXTREMITIES: pulses normal/equal, full ROM, mild tenderness along R medial thigh, no erythema or fluctuance or drainage SKIN: warm, color normal, well-healed sternotomy scar PSYCH: no abnormalities of mood noted, alert and oriented to situation     ED Treatments / Results   DIAGNOSTIC STUDIES: Oxygen Saturation is 100% on RA, normal by my interpretation.   COORDINATION OF CARE: 12:32 AM-Discussed next steps with pt. Pt verbalized understanding and is agreeable with the plan.    Labs (all labs ordered are listed, but only abnormal results are displayed) Labs Reviewed - No data to display  EKG  EKG Interpretation None       Radiology No results found.  Procedures Procedures (including critical care time)  Medications Ordered in ED Medications - No data to  display   Initial Impression / Assessment and Plan / ED Course  I have reviewed the triage vital signs and the nursing notes.      Pt stable Concern for DVT Will order for next day DVT study Pt denies active CP/SOB No signs of infectious etiology No acute vascular occlusion He is already on ASA/plavix, will defer any anticoagulation at this time   Final Clinical Impressions(s) / ED Diagnoses   Final diagnoses:  Pain in right leg    New Prescriptions New Prescriptions   No medications on file   I personally performed the services described in this documentation, which was scribed in my presence. The recorded information has been reviewed and is accurate.       Ripley Fraise, MD 01/27/17 [phone]

## 2017-01-27 NOTE — Progress Notes (Signed)
VASCULAR LAB PRELIMINARY  PRELIMINARY  PRELIMINARY  PRELIMINARY  Right lower extremity venous duplex completed.    Preliminary report:  There is no DVT or SVT noted in the right lower extremity.  Fluid noted at GSV harvest site.   Denman Pichardo, RVT 01/27/2017, 9:36 AM

## 2017-02-02 ENCOUNTER — Other Ambulatory Visit: Payer: Self-pay | Admitting: Thoracic Surgery (Cardiothoracic Vascular Surgery)

## 2017-02-02 DIAGNOSIS — Z951 Presence of aortocoronary bypass graft: Secondary | ICD-10-CM

## 2017-02-05 ENCOUNTER — Ambulatory Visit: Payer: Medicare Other | Admitting: Thoracic Surgery (Cardiothoracic Vascular Surgery)

## 2017-02-05 ENCOUNTER — Ambulatory Visit
Admission: RE | Admit: 2017-02-05 | Discharge: 2017-02-05 | Disposition: A | Discharge status: Home / Self Care | Payer: Medicare Other | Source: Ambulatory Visit | Attending: Thoracic Surgery (Cardiothoracic Vascular Surgery) | Admitting: Thoracic Surgery (Cardiothoracic Vascular Surgery)

## 2017-02-05 ENCOUNTER — Ambulatory Visit (INDEPENDENT_AMBULATORY_CARE_PROVIDER_SITE_OTHER): Payer: Medicare Other | Admitting: Physician Assistant

## 2017-02-05 VITALS — BP 137/68 | HR 71 | Resp 20 | Ht 74.0 in | Wt 237.0 lb

## 2017-02-05 DIAGNOSIS — Z951 Presence of aortocoronary bypass graft: Secondary | ICD-10-CM

## 2017-02-05 DIAGNOSIS — M79651 Pain in right thigh: Secondary | ICD-10-CM | POA: Diagnosis not present

## 2017-02-05 DIAGNOSIS — I25810 Atherosclerosis of coronary artery bypass graft(s) without angina pectoris: Secondary | ICD-10-CM | POA: Diagnosis not present

## 2017-02-05 NOTE — Progress Notes (Signed)
[address]       [place] [address]             [phone]      William Orr is a 71 y.o. male patient status post coronary bypass grafting 3 on 01/04/2017.     1. S/P CABG x 3    Past Medical History:  Diagnosis Date  . Diabetes mellitus without complication ([place])   . History of hiatal hernia   . Hyperlipidemia   . Hypertension   . S/P CABG x 3 01/04/2017   LIMA to LAD, Sequential SVG to D1-D2, EVH via right thigh   No past surgical history pertinent negatives on file. Scheduled Meds: Current Outpatient Prescriptions on File Prior to Visit  Medication Sig Dispense Refill  . acetaminophen (TYLENOL) 325 MG tablet Take 2 tablets (650 mg total) by mouth every 6 (six) hours as needed for mild pain or moderate pain.    . Alpha-Lipoic Acid 600 MG CAPS Take 600 mg by mouth daily.     [name] aspirin EC 81 MG EC tablet Take 1 tablet (81 mg total) by mouth daily.    . carvedilol (COREG) 25 MG tablet Take 25 mg by mouth 2 (two) times daily.     . Cholecalciferol (VITAMIN D-3) 5000 UNITS TABS Take 10,000 Units by mouth daily.     . clopidogrel (PLAVIX) 75 MG tablet Take 1 tablet (75 mg total) by mouth daily. 30 tablet 3  . diltiazem (TIAZAC) 300 MG 24 hr capsule Take 300 mg by mouth at bedtime.     . fluticasone (FLONASE) 50 MCG/ACT nasal spray Place 1 spray into both nostrils daily as needed (seasonal allergies).     [name] glipiZIDE (GLUCOTROL) 10 MG tablet Take 15 mg by mouth 2 (two) times daily.     [name] losartan (COZAAR) 100 MG tablet Take 100 mg by mouth daily.    . metFORMIN (GLUCOPHAGE) 850 MG tablet Take 850 mg by mouth 2 (two) times daily with a meal.     . Multiple Vitamin (MULTIVITAMIN WITH MINERALS) TABS tablet Take 2 tablets by mouth daily.    . NONFORMULARY OR COMPOUNDED ITEM Apply topically 4 (four) times daily as needed (for pain). Voltaren/Lidocaine Gel    . omega-3 acid ethyl esters (LOVAZA) 1 g capsule Take 1 capsule (1 g total) by mouth 2 (two) times  daily. 60 capsule 5  . OVER THE COUNTER MEDICATION Take 1 tablet by mouth 2 (two) times daily. Super Beta Prostate    . ranitidine (ZANTAC) 150 MG tablet Take 150 mg by mouth daily before breakfast.    . sodium chloride (OCEAN) 0.65 % SOLN nasal spray Place 1 spray into both nostrils 2 (two) times daily as needed for congestion.    . traMADol (ULTRAM) 50 MG tablet Take 1-2 tablets (50-100 mg total) by mouth every 4 (four) hours as needed for moderate pain. 30 tablet 0   No current facility-administered medications on file prior to visit.     Allergies  Allergen Reactions  . Lisinopril Cough    Blood pressure 137/68, pulse 71, resp. rate 20, height 6\' 2"  (1.88 m), weight 107.5 kg (237 lb), SpO2 96 %.  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 Today he is doing quite well. His main concern is burning on his right outer thigh. He actually went to the emergency department for the pain. At the emergency department the physician  thought that it was muscular but no particular treatment was provided.  Objective  Cor: RRR, no murmur Pulm: CTA bilaterally Abd: soft, nontender Ext: no edema Wound: clean and dry without signs of infection. There is a palpable cord where the vein was harvest which I explained can be normal and will heal on its own.   CLINICAL DATA:  Status post CABG 01/04/2017.  No chest complaints.  EXAM: CHEST  2 VIEW  COMPARISON:  None.  FINDINGS: There is no focal parenchymal opacity. There is no pleural effusion or pneumothorax. The heart and mediastinal contours are unremarkable. There are changes from recent CABG.  The osseous structures are unremarkable.  IMPRESSION: No active cardiopulmonary disease.   Electronically Signed   By: Kathreen Devoid   On: 02/05/2017 14:20   Assessment & Plan: Patient presents today for a routine postop visit. Overall he is doing quite well. He is able to get around  without pain. He shares that he has had some numbness and pain on his outer thigh. He went to the emergency department and a told him it was likely muscular but does not provide any therapy. Today I gave the patient some IT band stretches and asked him to address it with his primary care doctor whom he is seeing next week. His EVH site incision is healing quite well. There are no signs of infection. There is no drainage or erythema. He does have a palpable cord which I addressed today during our visit. He did go and get an ultrasound per his primary care physician which was negative for DVT. He has had some numbness on his left chest wall and occasionally sharp pain. I explained that this is most likely due to nerve damage and pain which showed resolve over time. I explained he is able to drive as long as he is no longer requiring narcotic pain medication. I suggested sticking to a 5-10 pound weight limit with his upper body until he starts cardiac rehabilitation in 2 weeks. He is to walk several times a day and increase his distance and time. He is visiting the dentist in the next couple days then I suggested revealing whether or not he is to be on antibiotics before and after cleanings now that he's had open heart surgery. He is to follow up in our office in 2 months with Dr. [name]. I also suggested bringing up the outer thigh burning and pain sensation to his primary care doctor next week if the stretches are not helping. He also stopped taking his statin due to his leg pain. His primary care doctor is working with him for another cholesterol medication. I suggested heat therapy and an Ace bandage for his palpable cord above the [facility] site. He is encouraged to call the office if he has questions or concerns  Elgie Collard 02/05/2017

## 2017-02-05 NOTE — Addendum Note (Signed)
Addended by: Elnita Maxwell R on: 02/05/2017 05:00 PM   Modules accepted: Orders

## 2017-02-05 NOTE — Patient Instructions (Addendum)
Make every effort to keep your diabetes under very tight control.  Follow up closely with your primary care physician or endocrinologist and strive to keep their hemoglobin A1c levels as low as possible, preferably near or below 6.0.  The long term benefits of strict control of diabetes are far reaching and critically important for your overall health and survival.  Make every effort to stay physically active, get some type of exercise on a regular basis, and stick to a "heart healthy diet".  The long term benefits for regular exercise and a healthy diet are critically important to your overall health and wellbeing.  Endocarditis is a potentially serious infection of heart valves or inside lining of the heart.  It occurs more commonly in patients with diseased heart valves (such as patient's with aortic or mitral valve disease) and in patients who have undergone heart valve repair or replacement.  Certain surgical and dental procedures may put you at risk, such as dental cleaning, other dental procedures, or any surgery involving the respiratory, urinary, gastrointestinal tract, gallbladder or prostate gland.   To minimize your chances for developing endocarditis, maintain good oral health and seek prompt medical attention for any infections involving the mouth, teeth, gums, skin or urinary tract.    Always notify your doctor or dentist about your underlying heart valve condition before having any invasive procedures. You will need to take antibiotics before certain procedures, including all routine dental cleanings or other dental procedures.  Your cardiologist or dentist should prescribe these antibiotics for you to be taken ahead of time.   You may return to driving an automobile as long as you are no longer requiring oral narcotic pain relievers during the daytime.  It would be wise to start driving only short distances during the daylight and gradually increase from there as you feel  comfortable.  Follow-up in 2 months. Please call the office with questions or concerns.

## 2017-02-13 DIAGNOSIS — M9901 Segmental and somatic dysfunction of cervical region: Secondary | ICD-10-CM | POA: Diagnosis not present

## 2017-02-13 DIAGNOSIS — M9902 Segmental and somatic dysfunction of thoracic region: Secondary | ICD-10-CM | POA: Diagnosis not present

## 2017-02-13 DIAGNOSIS — M9903 Segmental and somatic dysfunction of lumbar region: Secondary | ICD-10-CM | POA: Diagnosis not present

## 2017-02-15 DIAGNOSIS — I10 Essential (primary) hypertension: Secondary | ICD-10-CM | POA: Diagnosis not present

## 2017-02-15 DIAGNOSIS — M79675 Pain in left toe(s): Secondary | ICD-10-CM | POA: Diagnosis not present

## 2017-02-15 DIAGNOSIS — Z299 Encounter for prophylactic measures, unspecified: Secondary | ICD-10-CM | POA: Diagnosis not present

## 2017-02-15 DIAGNOSIS — I2510 Atherosclerotic heart disease of native coronary artery without angina pectoris: Secondary | ICD-10-CM | POA: Diagnosis not present

## 2017-02-15 DIAGNOSIS — R3915 Urgency of urination: Secondary | ICD-10-CM | POA: Diagnosis not present

## 2017-02-15 DIAGNOSIS — I493 Ventricular premature depolarization: Secondary | ICD-10-CM | POA: Diagnosis not present

## 2017-02-15 DIAGNOSIS — Z6830 Body mass index (BMI) 30.0-30.9, adult: Secondary | ICD-10-CM | POA: Diagnosis not present

## 2017-02-15 DIAGNOSIS — G4730 Sleep apnea, unspecified: Secondary | ICD-10-CM | POA: Diagnosis not present

## 2017-02-15 DIAGNOSIS — E1165 Type 2 diabetes mellitus with hyperglycemia: Secondary | ICD-10-CM | POA: Diagnosis not present

## 2017-02-15 DIAGNOSIS — E7800 Pure hypercholesterolemia, unspecified: Secondary | ICD-10-CM | POA: Diagnosis not present

## 2017-02-27 ENCOUNTER — Telehealth: Payer: Self-pay | Admitting: Cardiovascular Disease

## 2017-02-27 DIAGNOSIS — E785 Hyperlipidemia, unspecified: Secondary | ICD-10-CM

## 2017-02-27 NOTE — Telephone Encounter (Signed)
Spoke with patient.  He has appointment for Friday in [facility] lipid clinic.  In May triglycerides were > 400.  He had labs drawn several about a month later, had been on fish oil for just a few days, and trigs were down to 117.    Advised patient that we will continue with his current medications (he apparently bought OTC fish oil rather than taking Lovaza) and repeat labs in mid-August.  From there we can determine if he needs Lipid clinic appointment.  Patient voiced understanding.

## 2017-02-27 NOTE — Telephone Encounter (Signed)
Pt wants to know if he need lab work before his appt on Friday&-20-18)?

## 2017-02-28 NOTE — Telephone Encounter (Signed)
Lab order mailed to patient.

## 2017-03-01 DIAGNOSIS — E7800 Pure hypercholesterolemia, unspecified: Secondary | ICD-10-CM | POA: Diagnosis not present

## 2017-03-01 DIAGNOSIS — E119 Type 2 diabetes mellitus without complications: Secondary | ICD-10-CM | POA: Diagnosis not present

## 2017-03-01 DIAGNOSIS — I10 Essential (primary) hypertension: Secondary | ICD-10-CM | POA: Diagnosis not present

## 2017-03-05 ENCOUNTER — Encounter: Payer: Self-pay | Admitting: *Deleted

## 2017-03-05 NOTE — Progress Notes (Signed)
Per his request, I faxed a referral to begin [place] [facility] to [facility]. A referral was originally made to [facility], but he said [facility] was more convenient to him. All info requested was signed by Dr. [name] and faxed.

## 2017-03-13 DIAGNOSIS — E669 Obesity, unspecified: Secondary | ICD-10-CM | POA: Diagnosis not present

## 2017-03-13 DIAGNOSIS — E119 Type 2 diabetes mellitus without complications: Secondary | ICD-10-CM | POA: Diagnosis not present

## 2017-03-13 DIAGNOSIS — I10 Essential (primary) hypertension: Secondary | ICD-10-CM | POA: Diagnosis not present

## 2017-03-13 DIAGNOSIS — E7800 Pure hypercholesterolemia, unspecified: Secondary | ICD-10-CM | POA: Diagnosis not present

## 2017-03-13 DIAGNOSIS — Z6831 Body mass index (BMI) 31.0-31.9, adult: Secondary | ICD-10-CM | POA: Diagnosis not present

## 2017-03-13 DIAGNOSIS — Z951 Presence of aortocoronary bypass graft: Secondary | ICD-10-CM | POA: Diagnosis not present

## 2017-03-13 DIAGNOSIS — I2510 Atherosclerotic heart disease of native coronary artery without angina pectoris: Secondary | ICD-10-CM | POA: Diagnosis not present

## 2017-03-13 DIAGNOSIS — E785 Hyperlipidemia, unspecified: Secondary | ICD-10-CM | POA: Diagnosis not present

## 2017-03-15 DIAGNOSIS — I2510 Atherosclerotic heart disease of native coronary artery without angina pectoris: Secondary | ICD-10-CM | POA: Diagnosis not present

## 2017-03-15 DIAGNOSIS — Z951 Presence of aortocoronary bypass graft: Secondary | ICD-10-CM | POA: Diagnosis not present

## 2017-03-19 DIAGNOSIS — E1165 Type 2 diabetes mellitus with hyperglycemia: Secondary | ICD-10-CM | POA: Diagnosis not present

## 2017-03-19 DIAGNOSIS — M7021 Olecranon bursitis, right elbow: Secondary | ICD-10-CM | POA: Diagnosis not present

## 2017-03-19 DIAGNOSIS — G4730 Sleep apnea, unspecified: Secondary | ICD-10-CM | POA: Diagnosis not present

## 2017-03-19 DIAGNOSIS — Z6831 Body mass index (BMI) 31.0-31.9, adult: Secondary | ICD-10-CM | POA: Diagnosis not present

## 2017-03-19 DIAGNOSIS — Z299 Encounter for prophylactic measures, unspecified: Secondary | ICD-10-CM | POA: Diagnosis not present

## 2017-03-19 DIAGNOSIS — Z713 Dietary counseling and surveillance: Secondary | ICD-10-CM | POA: Diagnosis not present

## 2017-03-19 DIAGNOSIS — I10 Essential (primary) hypertension: Secondary | ICD-10-CM | POA: Diagnosis not present

## 2017-03-19 DIAGNOSIS — E7800 Pure hypercholesterolemia, unspecified: Secondary | ICD-10-CM | POA: Diagnosis not present

## 2017-03-20 DIAGNOSIS — I10 Essential (primary) hypertension: Secondary | ICD-10-CM | POA: Diagnosis not present

## 2017-03-20 DIAGNOSIS — Z951 Presence of aortocoronary bypass graft: Secondary | ICD-10-CM | POA: Diagnosis not present

## 2017-03-20 DIAGNOSIS — E119 Type 2 diabetes mellitus without complications: Secondary | ICD-10-CM | POA: Diagnosis not present

## 2017-03-20 DIAGNOSIS — E7800 Pure hypercholesterolemia, unspecified: Secondary | ICD-10-CM | POA: Diagnosis not present

## 2017-03-20 DIAGNOSIS — I2510 Atherosclerotic heart disease of native coronary artery without angina pectoris: Secondary | ICD-10-CM | POA: Diagnosis not present

## 2017-03-22 DIAGNOSIS — Z951 Presence of aortocoronary bypass graft: Secondary | ICD-10-CM | POA: Diagnosis not present

## 2017-03-22 DIAGNOSIS — I2510 Atherosclerotic heart disease of native coronary artery without angina pectoris: Secondary | ICD-10-CM | POA: Diagnosis not present

## 2017-03-27 DIAGNOSIS — Z951 Presence of aortocoronary bypass graft: Secondary | ICD-10-CM | POA: Diagnosis not present

## 2017-03-27 DIAGNOSIS — I2510 Atherosclerotic heart disease of native coronary artery without angina pectoris: Secondary | ICD-10-CM | POA: Diagnosis not present

## 2017-03-29 DIAGNOSIS — L609 Nail disorder, unspecified: Secondary | ICD-10-CM | POA: Diagnosis not present

## 2017-03-29 DIAGNOSIS — E1140 Type 2 diabetes mellitus with diabetic neuropathy, unspecified: Secondary | ICD-10-CM | POA: Diagnosis not present

## 2017-03-29 DIAGNOSIS — I2510 Atherosclerotic heart disease of native coronary artery without angina pectoris: Secondary | ICD-10-CM | POA: Diagnosis not present

## 2017-03-29 DIAGNOSIS — Z951 Presence of aortocoronary bypass graft: Secondary | ICD-10-CM | POA: Diagnosis not present

## 2017-03-29 DIAGNOSIS — L110 Acquired keratosis follicularis: Secondary | ICD-10-CM | POA: Diagnosis not present

## 2017-03-30 DIAGNOSIS — M9903 Segmental and somatic dysfunction of lumbar region: Secondary | ICD-10-CM | POA: Diagnosis not present

## 2017-03-30 DIAGNOSIS — M9901 Segmental and somatic dysfunction of cervical region: Secondary | ICD-10-CM | POA: Diagnosis not present

## 2017-03-30 DIAGNOSIS — M9902 Segmental and somatic dysfunction of thoracic region: Secondary | ICD-10-CM | POA: Diagnosis not present

## 2017-04-02 DIAGNOSIS — Z951 Presence of aortocoronary bypass graft: Secondary | ICD-10-CM | POA: Diagnosis not present

## 2017-04-02 DIAGNOSIS — I2510 Atherosclerotic heart disease of native coronary artery without angina pectoris: Secondary | ICD-10-CM | POA: Diagnosis not present

## 2017-04-04 DIAGNOSIS — I2510 Atherosclerotic heart disease of native coronary artery without angina pectoris: Secondary | ICD-10-CM | POA: Diagnosis not present

## 2017-04-04 DIAGNOSIS — Z951 Presence of aortocoronary bypass graft: Secondary | ICD-10-CM | POA: Diagnosis not present

## 2017-04-05 DIAGNOSIS — I2510 Atherosclerotic heart disease of native coronary artery without angina pectoris: Secondary | ICD-10-CM | POA: Diagnosis not present

## 2017-04-05 DIAGNOSIS — Z951 Presence of aortocoronary bypass graft: Secondary | ICD-10-CM | POA: Diagnosis not present

## 2017-04-09 ENCOUNTER — Ambulatory Visit (INDEPENDENT_AMBULATORY_CARE_PROVIDER_SITE_OTHER): Payer: Self-pay | Admitting: Thoracic Surgery (Cardiothoracic Vascular Surgery)

## 2017-04-09 ENCOUNTER — Encounter: Payer: Self-pay | Admitting: Thoracic Surgery (Cardiothoracic Vascular Surgery)

## 2017-04-09 VITALS — BP 134/74 | HR 58 | Resp 16 | Ht 74.0 in | Wt 240.0 lb

## 2017-04-09 DIAGNOSIS — Z951 Presence of aortocoronary bypass graft: Secondary | ICD-10-CM

## 2017-04-09 NOTE — Patient Instructions (Addendum)
Continue all previous medications without any changes at this time.  It would be reasonable to stop taking Plavix in 3 months.  Discuss this with your cardiologist  You may resume unrestricted physical activity without any particular limitations at this time.  Make every effort to keep your diabetes under very tight control.  Follow up closely with your primary care physician or endocrinologist and strive to keep their hemoglobin A1c levels as low as possible, preferably near or below 6.0.  The long term benefits of strict control of diabetes are far reaching and critically important for your overall health and survival.  Make every effort to stay physically active, get some type of exercise on a regular basis, and stick to a "heart healthy diet".  The long term benefits for regular exercise and a healthy diet are critically important to your overall health and wellbeing.

## 2017-04-09 NOTE — Progress Notes (Signed)
[address]       [place] [address]             [phone]     CARDIOTHORACIC SURGERY OFFICE NOTE  Referring Provider is William Crome, MD  Primary Cardiologist is William Sine, MD PCP is William Blitz, MD   HPI:  Patient is a 71 year old male with history of hypertension, hyperlipidemia, type 2 diabetes mellitus, obesity, and obstructive sleep apnea on CPAP who returns to the office today for routine follow-up status post coronary artery bypass grafting x3 on 01/04/2017 for severe multivessel coronary artery disease with unstable angina pectoris.  The patient's early postoperative recovery in the hospital was uneventful and he was discharged home on the fifth postoperative day.  Since hospital discharge she has been seen in follow-up by William Orr at [facility] on 01/18/2017 and more recently in our office on 02/05/2017.  He returns to our office for routine follow-up today. He reports that he is doing very well. He has participating in the outpatient cardiac rehabilitation program, which he enjoys. He thinks this is helped him a lot and he has made good progress. His exercise tolerance continues to improve. He no longer has any symptoms of exertional chest pain or chest tightness.  The soreness in his chest has resolved. Overall he is pleased with his progress.   Current Outpatient Prescriptions  Medication Sig Dispense Refill  . acetaminophen (TYLENOL) 325 MG tablet Take 2 tablets (650 mg total) by mouth every 6 (six) hours as needed for mild pain or moderate pain.    . Alpha-Lipoic Acid 600 MG CAPS Take 600 mg by mouth daily.     [name] aspirin EC 81 MG EC tablet Take 1 tablet (81 mg total) by mouth daily.    . carvedilol (COREG) 25 MG tablet Take 25 mg by mouth 2 (two) times daily.     . Cholecalciferol (VITAMIN D-3) 5000 UNITS TABS Take 10,000 Units by mouth daily.     . clopidogrel (PLAVIX) 75 MG tablet Take 1 tablet (75 mg total) by mouth daily. 30 tablet  3  . diltiazem (TIAZAC) 300 MG 24 hr capsule Take 300 mg by mouth at bedtime.     . fluticasone (FLONASE) 50 MCG/ACT nasal spray Place 1 spray into both nostrils daily as needed (seasonal allergies).     [name] glipiZIDE (GLUCOTROL) 10 MG tablet Take 15 mg by mouth 2 (two) times daily.     [name] losartan (COZAAR) 100 MG tablet Take 100 mg by mouth daily.    . metFORMIN (GLUCOPHAGE) 850 MG tablet Take 850 mg by mouth 2 (two) times daily with a meal.     . Multiple Vitamin (MULTIVITAMIN WITH MINERALS) TABS tablet Take 2 tablets by mouth daily.    . NONFORMULARY OR COMPOUNDED ITEM Apply topically 4 (four) times daily as needed (for pain). Voltaren/Lidocaine Gel    . omega-3 acid ethyl esters (LOVAZA) 1 g capsule Take 1 capsule (1 g total) by mouth 2 (two) times daily. 60 capsule 5  . OVER THE COUNTER MEDICATION Take 1 tablet by mouth 2 (two) times daily. Super Beta Prostate    . ranitidine (ZANTAC) 150 MG tablet Take 150 mg by mouth daily before breakfast.    . sodium chloride (OCEAN) 0.65 % SOLN nasal spray Place 1 spray into both nostrils 2 (two) times daily as needed for congestion.     No current facility-administered medications for this visit.  Physical Exam:   BP 134/74 (BP Location: Right Arm, Patient Position: Sitting, Cuff Size: Large)   Pulse (!) 58   Resp 16   Ht 6\' 2"  (1.88 m)   Wt 240 lb (108.9 kg)   SpO2 98% Comment: ON RA  BMI 30.81 kg/m   General:  Well-appearing  Chest:   Clear to auscultation  CV:   Regular rate and rhythm  Incisions:  Completely healed, sternum is stable  Abdomen:  Soft and nontender  Extremities:  Warm and well-perfused  Diagnostic Tests:  n/a   Impression:  Patient is doing very well approximately 3 months status post coronary artery bypass grafting for unstable angina  Plan:  We have not recommended any changes to the patient's current medications. I advised the patient that it might be reasonable to consider stopping Plavix in 3 months. I  have encouraged the patient to continue to gradually increase his physical activity without any particular limitations at this time. We discussed the importance of long term tight control diabetes, regular exercise, and a heart healthy diet. All of his questions have been addressed. The patient will continue to follow-up with Dr. Claiborne Orr. He will return to our office next spring for routine follow-up approximately one year after his surgery. He will call and return sooner should specific problems or questions arise.    William Orr. William Manns, MD 04/09/2017 1:26 PM

## 2017-04-11 DIAGNOSIS — Z951 Presence of aortocoronary bypass graft: Secondary | ICD-10-CM | POA: Diagnosis not present

## 2017-04-11 DIAGNOSIS — I2510 Atherosclerotic heart disease of native coronary artery without angina pectoris: Secondary | ICD-10-CM | POA: Diagnosis not present

## 2017-04-13 DIAGNOSIS — I2510 Atherosclerotic heart disease of native coronary artery without angina pectoris: Secondary | ICD-10-CM | POA: Diagnosis not present

## 2017-04-13 DIAGNOSIS — Z951 Presence of aortocoronary bypass graft: Secondary | ICD-10-CM | POA: Diagnosis not present

## 2017-04-19 DIAGNOSIS — Z951 Presence of aortocoronary bypass graft: Secondary | ICD-10-CM | POA: Diagnosis not present

## 2017-04-19 DIAGNOSIS — I2510 Atherosclerotic heart disease of native coronary artery without angina pectoris: Secondary | ICD-10-CM | POA: Diagnosis not present

## 2017-04-20 DIAGNOSIS — I2510 Atherosclerotic heart disease of native coronary artery without angina pectoris: Secondary | ICD-10-CM | POA: Diagnosis not present

## 2017-04-20 DIAGNOSIS — Z951 Presence of aortocoronary bypass graft: Secondary | ICD-10-CM | POA: Diagnosis not present

## 2017-04-23 DIAGNOSIS — Z951 Presence of aortocoronary bypass graft: Secondary | ICD-10-CM | POA: Diagnosis not present

## 2017-04-23 DIAGNOSIS — I2510 Atherosclerotic heart disease of native coronary artery without angina pectoris: Secondary | ICD-10-CM | POA: Diagnosis not present

## 2017-04-24 DIAGNOSIS — E785 Hyperlipidemia, unspecified: Secondary | ICD-10-CM | POA: Diagnosis not present

## 2017-04-25 DIAGNOSIS — I2510 Atherosclerotic heart disease of native coronary artery without angina pectoris: Secondary | ICD-10-CM | POA: Diagnosis not present

## 2017-04-25 DIAGNOSIS — Z951 Presence of aortocoronary bypass graft: Secondary | ICD-10-CM | POA: Diagnosis not present

## 2017-04-25 LAB — LIPID PANEL
CHOLESTEROL TOTAL: 160 mg/dL (ref 100–199)
Chol/HDL Ratio: 5.5 ratio — ABNORMAL HIGH (ref 0.0–5.0)
HDL: 29 mg/dL — ABNORMAL LOW (ref >39)
LDL Calculated: 63 mg/dL (ref 0–99)
TRIGLYCERIDES: 340 mg/dL — AB (ref 0–149)
VLDL Cholesterol Cal: 68 mg/dL — ABNORMAL HIGH (ref 5–40)

## 2017-04-27 DIAGNOSIS — Z951 Presence of aortocoronary bypass graft: Secondary | ICD-10-CM | POA: Diagnosis not present

## 2017-04-27 DIAGNOSIS — I2510 Atherosclerotic heart disease of native coronary artery without angina pectoris: Secondary | ICD-10-CM | POA: Diagnosis not present

## 2017-04-30 DIAGNOSIS — Z951 Presence of aortocoronary bypass graft: Secondary | ICD-10-CM | POA: Diagnosis not present

## 2017-04-30 DIAGNOSIS — I2510 Atherosclerotic heart disease of native coronary artery without angina pectoris: Secondary | ICD-10-CM | POA: Diagnosis not present

## 2017-05-02 DIAGNOSIS — I2510 Atherosclerotic heart disease of native coronary artery without angina pectoris: Secondary | ICD-10-CM | POA: Diagnosis not present

## 2017-05-02 DIAGNOSIS — Z951 Presence of aortocoronary bypass graft: Secondary | ICD-10-CM | POA: Diagnosis not present

## 2017-05-04 ENCOUNTER — Encounter: Payer: Self-pay | Admitting: Cardiovascular Disease

## 2017-05-04 ENCOUNTER — Ambulatory Visit (INDEPENDENT_AMBULATORY_CARE_PROVIDER_SITE_OTHER): Payer: Medicare Other | Admitting: Cardiovascular Disease

## 2017-05-04 VITALS — BP 154/79 | HR 60 | Ht 74.0 in | Wt 246.0 lb

## 2017-05-04 DIAGNOSIS — E785 Hyperlipidemia, unspecified: Secondary | ICD-10-CM | POA: Diagnosis not present

## 2017-05-04 DIAGNOSIS — I10 Essential (primary) hypertension: Secondary | ICD-10-CM

## 2017-05-04 DIAGNOSIS — E118 Type 2 diabetes mellitus with unspecified complications: Secondary | ICD-10-CM | POA: Diagnosis not present

## 2017-05-04 DIAGNOSIS — Z9989 Dependence on other enabling machines and devices: Secondary | ICD-10-CM

## 2017-05-04 DIAGNOSIS — I25810 Atherosclerosis of coronary artery bypass graft(s) without angina pectoris: Secondary | ICD-10-CM | POA: Diagnosis not present

## 2017-05-04 DIAGNOSIS — Z79899 Other long term (current) drug therapy: Secondary | ICD-10-CM | POA: Diagnosis not present

## 2017-05-04 DIAGNOSIS — E782 Mixed hyperlipidemia: Secondary | ICD-10-CM | POA: Diagnosis not present

## 2017-05-04 DIAGNOSIS — E669 Obesity, unspecified: Secondary | ICD-10-CM

## 2017-05-04 DIAGNOSIS — Z951 Presence of aortocoronary bypass graft: Secondary | ICD-10-CM | POA: Diagnosis not present

## 2017-05-04 DIAGNOSIS — I2510 Atherosclerotic heart disease of native coronary artery without angina pectoris: Secondary | ICD-10-CM | POA: Diagnosis not present

## 2017-05-04 DIAGNOSIS — G4733 Obstructive sleep apnea (adult) (pediatric): Secondary | ICD-10-CM

## 2017-05-04 MED ORDER — FENOFIBRATE 145 MG PO TABS: 145.0000 mg | ORAL_TABLET | Freq: Every day | ORAL | 3 refills | Status: DC

## 2017-05-04 MED ORDER — CLOPIDOGREL BISULFATE 75 MG PO TABS: 75.0000 mg | ORAL_TABLET | Freq: Every day | ORAL | 3 refills | Status: DC

## 2017-05-04 NOTE — Patient Instructions (Signed)
Medication Instructions:  START fenofibrate 145mg  (1 tablet) daily-rx sent to pharmacy  Labwork: Please return for FASTING  labs in 3 MONTHS.  Our in office lab hours are Monday-Friday 8:30-4:30, closed for lunch 12:30-2 pm.  No appointment needed.  Follow-Up: Your physician wants you to follow-up in: 4 MONTHS with [name].  You will receive a reminder letter in the mail two months in advance. If you don't receive a letter, please call our office to schedule the follow-up appointment.   Any Other Special Instructions Will Be Listed Below (If Applicable).     If you need a refill on your cardiac medications before your next appointment, please call your pharmacy.

## 2017-05-04 NOTE — Progress Notes (Signed)
Patient ID: William Orr, male   DOB: 05/14/1946, 71 y.o.   MRN: [id number]    Primary M.D.: Dr.A Lora Havens  HPI: William Orr is a 71 year old African American male who presents to the office for a 4 month follow-up cardiology following his recent CABG revascularization surgery.  William Orr  has a long history of hypertension,  type 2 diabetes mellitus, and hyperlipidemia. He  underwent an exercise nuclear study in [place] for atypical chest pain which revealed normal perfusion with an ejection fraction of 61%. There was no evidence for ischemia and he had normal wall motion. He was hypertensive throughout the study.  He has been maintained on blood pressure medicines consisting of diltiazem 300 mg daily, carvedilol 25 mg twice a day, and losartan HCT 100/25 mg. He previously, his had taken simvastatin 20 mg for hyperlipidemia  but self discontinued this.  Recently, he has been taking over-the-counter supplements with fair amount of omega-3 fatty acids for elevated triglycerides.[name] He takes Glucophage 850 mg in the morning and 2 tablets at night.   He has had some instances of atypical, probable musculoskeletal chest pain which improved with nonsteroidal therapy.  He underwent a 2-D echo Doppler study on 09/25/2013 and this revealed an ejection fraction of 60-65% with mild focal basal hypertrophy of the septum had normal diastolic parameters. There was moderate thickening of the trileaflet aortic valve consistent with aortic valve sclerosis. He had mild left atrial dilatation. His right ventricle was upper normal to mildly dilated.  Due to my concerns that he may have significant obstructive sleep apnea he underwent a diagnostic polysomnogram in March 2015 which revealed mild sleep apnea overall, with an AHI of 5.8, but he had moderate sleep apnea during REM sleep with an AHI of 25/hr.  He also had significant oxygen desaturation to 84% with non-REM sleep and 76% with REM sleep and had evidence  for very loud snoring.  His periodic limb movement index was 6.7 per hour with 9.5 per hour to arousal.  He underwent CPAP titration and 11 cm pressure was recommended.  He has a ResMed AirSense10 AutoSet.  A download from 03/11/2014 through 04/09/2014 revealed that he is meeting Medicare compliance with 100% days of the device usage.  97% of the time was used at greater than 4 hours.  With his current setting, AHI is excellent at 1.0.  His were index was 0.4.  He's using a fullface mask.  His DME company is [employer] in [place], [place].  Subjectively, since initiating therapy.  He feels markedly improved.  He denies daytime sleepiness.  He denies breakthrough snoring.  His sleep is more restorative.  He has more energy.  He denies hypersomnolence.  Over the last year, he has continued to use CPAP but states there are occasional nights where he falls asleep before putting on his CPAP unit.  He does not sleep as well most nights and oftentimes wakes up several more times to go to the bathroom. Recently, he has noticed that he may experience some increased palpitations in the morning.  He has not been able to correlate if this is related to potential night sweats.   A download was obtained from 09/27/2015 through 12/25/2015.  This showed 77% of days of use with 69% of the days greater than 4 hours.  He was averaging 5 hours and 42 minutes of CPAP use.  His AHI at 11 cm water pressure remained excellent at 0.7.    A 2-D echo Doppler study revealed  an EF of 60-65%.  There was grade 2 diastolic dysfunction.  There was aortic valve sclerosis without stenosis or regurgitation.  There was suggestion that there was mildly reduced RV function.  Since I last saw him, he was admitted to [facility] with unstable angina and underwent catheterization on 01/02/2017 in the setting of a non-ST segment MI.  He was found to have multifocal high-grade disease with complex bifurcation stenosis of his proximal LAD and  diagonal.  He underwent CABG revascularization surgery by Dr. [name] on 01/04/2017 and had a LIMA placed to his distal LAD, vein graft to the first diagonal vessel, and vein graft to the second diagonal vessel.  He was seen in follow-up by Almyra Deforest, PAC.  He had significant hypertriglyceridemia and was started on low vase and due to myalgias with Crestor.  His statin therapy was changed to simvastatin, which he has tolerated.  He completed 8 weeks of cardiac rehabilitation with his last exercise this morning.  Typically his blood pressure at cardiac rehabilitation systolically is in the 536U to 130s.  He denies any recurrent chest pain or palpitations.  He continues to use CPAP with 100% compliance with [employer] as his DME company.  He's not had a recent download.  He presents for evaluation.  Past Medical History:  Diagnosis Date  . Diabetes mellitus without complication ([place])   . History of hiatal hernia   . Hyperlipidemia   . Hypertension   . S/P CABG x 3 01/04/2017   LIMA to LAD, Sequential SVG to D1-D2, EVH via right thigh    Past Surgical History:  Procedure Laterality Date  . CORONARY ARTERY BYPASS GRAFT N/A 01/04/2017   Procedure: CORONARY ARTERY BYPASS GRAFTING (CABG)X3, ON PUMP, USING LEFT INTERNAL MAMMARY ARTERY AND RIGHT GREATER SAPHENOUS VEIN HARVESTED ENDOSCOPICALLY, LIMA-LAD, SEQ SVG- DIAG 1 -DIAG 2;  Surgeon: [name], MD;  Location: [place];  Service: Open Heart Surgery;  Laterality: N/A;  . KNEE ARTHROSCOPY Bilateral   . LEFT HEART CATH AND CORONARY ANGIOGRAPHY N/A 01/02/2017   Procedure: Left Heart Cath and Coronary Angiography;  Surgeon: [name], MD;  Location: [place] [facility];  Service: Cardiovascular;  Laterality: N/A;  . TEE WITHOUT CARDIOVERSION N/A 01/04/2017   Procedure: TRANSESOPHAGEAL ECHOCARDIOGRAM (TEE);  Surgeon: [name], MD;  Location: [place];  Service: Open Heart Surgery;  Laterality: N/A;    Allergies  Allergen Reactions  . Lisinopril  Cough    Current Outpatient Prescriptions  Medication Sig Dispense Refill  . acetaminophen (TYLENOL) 650 MG CR tablet Take 650 mg by mouth every 8 (eight) hours as needed for pain.    . Alpha-Lipoic Acid 600 MG CAPS Take 600 mg by mouth daily.     [name] aspirin EC 81 MG EC tablet Take 1 tablet (81 mg total) by mouth daily.    . carvedilol (COREG) 25 MG tablet Take 25 mg by mouth 2 (two) times daily.     . Cholecalciferol (VITAMIN D-3) 5000 UNITS TABS Take 10,000 Units by mouth daily.     . clopidogrel (PLAVIX) 75 MG tablet Take 1 tablet (75 mg total) by mouth daily. 30 tablet 3  . diltiazem (TIAZAC) 300 MG 24 hr capsule Take 300 mg by mouth at bedtime.     . fluticasone (FLONASE) 50 MCG/ACT nasal spray Place 1 spray into both nostrils daily as needed (seasonal allergies).     [name] glipiZIDE (GLUCOTROL) 10 MG tablet Take 15 mg by mouth 2 (two) times  daily.     . losartan (COZAAR) 100 MG tablet Take 100 mg by mouth daily.    . metFORMIN (GLUCOPHAGE) 850 MG tablet Take 850 mg by mouth 2 (two) times daily with a meal.     . Multiple Vitamin (MULTIVITAMIN WITH MINERALS) TABS tablet Take 2 tablets by mouth daily.    . NONFORMULARY OR COMPOUNDED ITEM Apply topically 4 (four) times daily as needed (for pain). Voltaren/Lidocaine Gel    . Omega-3 Fatty Acids (FISH OIL) 1000 MG CAPS Take 2 capsules by mouth 2 (two) times daily.    [name] OVER THE COUNTER MEDICATION Take 1 tablet by mouth 2 (two) times daily. Super Beta Prostate    . ranitidine (ZANTAC) 150 MG tablet Take 150 mg by mouth daily before breakfast.    . sodium chloride (OCEAN) 0.65 % SOLN nasal spray Place 1 spray into both nostrils 2 (two) times daily as needed for congestion.    . fenofibrate (TRICOR) 145 MG tablet Take 1 tablet (145 mg total) by mouth daily. 90 tablet 3   No current facility-administered medications for this visit.     Social history is notable he is married for 45 years. He has 2 children and 3 grandchildren. 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 He is a BS degree. There is no history of tobacco use. There is no alcohol use.  Family History  Problem Relation Age of Onset  . Diabetes Mother   . Cancer - Prostate Father   . Rheum arthritis Sister   . Diabetes Brother   . Arthritis Brother   . Cancer - Other Brother        pancreatic  . Rheum arthritis Brother   . Diabetes type II Son    ROS General: Negative; No fevers, chills, or night sweats;  HEENT: Negative; No changes in vision or hearing, sinus congestion, difficulty swallowing Pulmonary: Negative; No cough, wheezing, shortness of breath, hemoptysis Cardiovascular: Occasional atypical chest pain; No presyncope, syncope, palpitations GI: Positive for hiatal hernia; No nausea, vomiting, diarrhea, or abdominal pain GU: Negative; No dysuria, hematuria, or difficulty voiding Musculoskeletal: Negative; no myalgias, joint pain, or weakness Hematologic/Oncology: Negative; no easy bruising, bleeding Endocrine: Positive for diabetes; no heat/cold intolerance; Neuro: Negative; no changes in balance, headaches Skin: Negative; No rashes or skin lesions Psychiatric: Negative; No behavioral problems, depression Sleep: since starting CPAP therapy no snoring, daytime sleepiness, hypersomnolence, bruxism, restless legs, hypnogognic hallucinations, no cataplexy Other comprehensive 14 point system review is negative.   PE BP (!) 154/79   Pulse 60   Ht '6\' 2"'$  (1.88 m)   Wt 246 lb (111.6 kg)   BMI 31.58 kg/m    Wt Readings from Last 3 Encounters:  05/04/17 246 lb (111.6 kg)  04/09/17 240 lb (108.9 kg)  02/05/17 237 lb (107.5 kg)   General: Alert, oriented, no distress.  Skin: normal turgor, no rashes, warm and dry HEENT: Normocephalic, atraumatic. Pupils equal round and reactive to light; sclera anicteric; extraocular muscles intact;  Nose without nasal septal hypertrophy Mouth/Parynx  benign; Mallinpatti scale 3 Neck: No JVD, no carotid bruits; normal carotid upstroke Lungs: clear to ausculatation and percussion; no wheezing or rales Chest wall: without tenderness to palpitation Heart: PMI not displaced, RRR, s1 s2 normal, 1/6 systolic murmur, no diastolic murmur, no rubs, gallops, thrills, or heaves Abdomen: soft, nontender; no hepatosplenomehaly, BS+; abdominal aorta nontender and not dilated by palpation. Back: no CVA  tenderness Pulses 2+ Musculoskeletal: full range of motion, normal strength, no joint deformities Extremities: no clubbing cyanosis or edema, Homan's sign negative  Neurologic: grossly nonfocal; Cranial nerves grossly wnl Psychologic: Normal mood and affect   ECG (independently read by me): Sinus rhythm at 60 bpm with first-degree AV block.  Small Q wave in lead 3.  Nonspecific T changes.  PR interval 220 ms.  May 2018 ECG (independently read by me): Sinus bradycardia 52 bpm.  Q wave in lead 3.  Normal intervals  January 2017 ECG (independently read by me):  Normal sinus rhythm at 62 with an isolated PVC.  QTC 410 ms.  First degree AV block with a PR interval 226.  Previous ECG from 09/15/13 (independently read by me): Sinus rhythm at 67 beats per minute with borderline first-degree block with PR interval 212 ms.   LABS:  BMP Latest Ref Rng & Units 01/09/2017 01/08/2017 01/07/2017  Glucose 65 - 99 mg/dL 136(H) 171(H) 125(H)  BUN 6 - 20 mg/dL '10 12 14  '$ Creatinine 0.61 - 1.24 mg/dL 0.78 0.81 0.87  BUN/Creat Ratio 10 - 22 - - -  Sodium 135 - 145 mmol/L 136 132(L) 131(L)  Potassium 3.5 - 5.1 mmol/L 3.6 3.9 3.9  Chloride 101 - 111 mmol/L 101 99(L) 100(L)  CO2 22 - 32 mmol/L '26 26 27  '$ Calcium 8.9 - 10.3 mg/dL 8.9 8.6(L) 8.3(L)   Hepatic Function Latest Ref Rng & Units 01/26/2017 01/03/2017 08/27/2015  Total Protein 6.0 - 8.5 g/dL 7.4 7.6 7.1  Albumin 3.6 - 4.8 g/dL 4.3 4.0 4.3  AST 0 - 40 IU/L 25 35 17  ALT 0 - 44 IU/L 32 39 16  Alk Phosphatase 39 - 117  IU/L 80 68 63  Total Bilirubin 0.0 - 1.2 mg/dL <0.2 0.7 0.3  Bilirubin, Direct 0.00 - 0.40 mg/dL 0.09 - -   CBC Latest Ref Rng & Units 01/09/2017 01/08/2017 01/07/2017  WBC 4.0 - 10.5 K/uL 5.7 7.4 10.1  Hemoglobin 13.0 - 17.0 g/dL 9.9(L) 9.3(L) 9.5(L)  Hematocrit 39.0 - 52.0 % 30.4(L) 29.0(L) 29.7(L)  Platelets 150 - 400 K/uL 268 212 173   Lab Results  Component Value Date   MCV 91.8 01/09/2017   MCV 92.4 01/08/2017   MCV 92.0 01/07/2017   Lab Results  Component Value Date   TSH 2.850 08/27/2015   Lab Results  Component Value Date   HGBA1C 7.2 (H) 01/03/2017   Lipid Panel     Component Value Date/Time   CHOL 160 04/24/2017 1044   TRIG 340 (H) 04/24/2017 1044   HDL 29 (L) 04/24/2017 1044   CHOLHDL 5.5 (H) 04/24/2017 1044   CHOLHDL 4.5 01/01/2017 0042   VLDL UNABLE TO CALCULATE IF TRIGLYCERIDE OVER 400 mg/dL 01/01/2017 0042   LDLCALC 63 04/24/2017 1044     RADIOLOGY: No results found.  IMPRESSION:  1. Coronary artery disease involving coronary bypass graft of native heart without angina pectoris   2. Essential hypertension   3. Hyperlipidemia with target LDL less than 70   4. Mixed hyperlipidemia   5. Medication management   6. Type 2 diabetes mellitus with complication, without long-term current use of insulin (HCC)   7. OSA on CPAP   8. Mild obesity     ASSESSMENT AND PLAN: William Orr is a 71 year old African American male who has a history of long-standing  hypertension and diabetes mellitus. There is a family history for coronary artery disease with his mother dying from myocardial infarction and who  also diabetes mellitus at age 36.  A prior nuclear study had revealed normal perfusion and  An echo Doppler study which revealed normal systolic function without regional wall motion abnormalities.  There was grade 2 diastolic dysfunction.  Abnormal tissue Doppler suggesting elevation of ventricular filling pressures.  He developed new onset unstable angina leading  to urgent catheterization on 01/02/2017.  He was found to have a complex bifurcation stenosis with a [place] 011 lesion.  He ultimately underwent CABG surgery with LIMA to his LAD and vein grafts to 2 diagonal vessels.  He has tolerated surgical revascularization well.  He is completing cardiac rehabilitation.  Blood pressure during cardiac rehabilitation he states has consistently been in the 120s to 130s and was so this morning.  On examining the office today his blood pressure was elevated, but he was seen in the afternoon right after having lunch that most likely had increased sodium content.  For this reason, I will continue his regimen with losartan 100 mg daily, diltiazem 300 mg daily, and carvedilol 25 mg twice a day for blood pressure control.  He will monitor his blood pressure closely and if increase he may require the addition of low-dose diuretic therapy.  He is now taking OmaX3 .  For his omega-3 fatty acids, which includes 1.125 g of EPA and 275 mg of DHEA.  Follow-up lipid study last week.  Continue to show elevated triglycerides at 340, improved from 413.  HDL was low at 29, VLDL increased at 68, and LDL 63.  I am adding fenofibrate 145 mg to his regimen.  Apparently has had issues with statins resulted in myalgias.  He is diabetic on metformin, glipizide therapy.  There are no CHF symptoms.  With reference to his obstructive sleep apnea and CPAP therapy, I reviewed his last download. AHI was excellent at 0.7, at his fixed 11 cm water pressure.  I discussed optimal sleep duration.  A new download will be obtained.  He has a history of a hiatal hernia.  His GERD is controlled with omeprazole.  We discussed weight loss and since I last saw him.  His BMI has improved to 31.58.  3 months, he will undergo repeat laboratory.  I will see him in 4 months for reevaluation. Time spent: 25 minutes Troy Sine, MD, [facility] 05/06/2017 8:50 AM

## 2017-05-07 DIAGNOSIS — E7800 Pure hypercholesterolemia, unspecified: Secondary | ICD-10-CM | POA: Diagnosis not present

## 2017-05-07 DIAGNOSIS — E119 Type 2 diabetes mellitus without complications: Secondary | ICD-10-CM | POA: Diagnosis not present

## 2017-05-07 DIAGNOSIS — I10 Essential (primary) hypertension: Secondary | ICD-10-CM | POA: Diagnosis not present

## 2017-05-09 DIAGNOSIS — M9902 Segmental and somatic dysfunction of thoracic region: Secondary | ICD-10-CM | POA: Diagnosis not present

## 2017-05-09 DIAGNOSIS — M9903 Segmental and somatic dysfunction of lumbar region: Secondary | ICD-10-CM | POA: Diagnosis not present

## 2017-05-09 DIAGNOSIS — M62838 Other muscle spasm: Secondary | ICD-10-CM | POA: Diagnosis not present

## 2017-05-09 DIAGNOSIS — M9901 Segmental and somatic dysfunction of cervical region: Secondary | ICD-10-CM | POA: Diagnosis not present

## 2017-05-11 DIAGNOSIS — M62838 Other muscle spasm: Secondary | ICD-10-CM | POA: Diagnosis not present

## 2017-05-11 DIAGNOSIS — M9903 Segmental and somatic dysfunction of lumbar region: Secondary | ICD-10-CM | POA: Diagnosis not present

## 2017-05-11 DIAGNOSIS — M9901 Segmental and somatic dysfunction of cervical region: Secondary | ICD-10-CM | POA: Diagnosis not present

## 2017-05-11 DIAGNOSIS — M9902 Segmental and somatic dysfunction of thoracic region: Secondary | ICD-10-CM | POA: Diagnosis not present

## 2017-05-16 DIAGNOSIS — M9901 Segmental and somatic dysfunction of cervical region: Secondary | ICD-10-CM | POA: Diagnosis not present

## 2017-05-16 DIAGNOSIS — M9902 Segmental and somatic dysfunction of thoracic region: Secondary | ICD-10-CM | POA: Diagnosis not present

## 2017-05-16 DIAGNOSIS — M9903 Segmental and somatic dysfunction of lumbar region: Secondary | ICD-10-CM | POA: Diagnosis not present

## 2017-05-16 DIAGNOSIS — M62838 Other muscle spasm: Secondary | ICD-10-CM | POA: Diagnosis not present

## 2017-05-17 DIAGNOSIS — M9901 Segmental and somatic dysfunction of cervical region: Secondary | ICD-10-CM | POA: Diagnosis not present

## 2017-05-17 DIAGNOSIS — M9902 Segmental and somatic dysfunction of thoracic region: Secondary | ICD-10-CM | POA: Diagnosis not present

## 2017-05-17 DIAGNOSIS — M9903 Segmental and somatic dysfunction of lumbar region: Secondary | ICD-10-CM | POA: Diagnosis not present

## 2017-05-17 DIAGNOSIS — M62838 Other muscle spasm: Secondary | ICD-10-CM | POA: Diagnosis not present

## 2017-05-21 DIAGNOSIS — M9903 Segmental and somatic dysfunction of lumbar region: Secondary | ICD-10-CM | POA: Diagnosis not present

## 2017-05-21 DIAGNOSIS — M62838 Other muscle spasm: Secondary | ICD-10-CM | POA: Diagnosis not present

## 2017-05-21 DIAGNOSIS — M9902 Segmental and somatic dysfunction of thoracic region: Secondary | ICD-10-CM | POA: Diagnosis not present

## 2017-05-21 DIAGNOSIS — H2513 Age-related nuclear cataract, bilateral: Secondary | ICD-10-CM | POA: Diagnosis not present

## 2017-05-21 DIAGNOSIS — E1165 Type 2 diabetes mellitus with hyperglycemia: Secondary | ICD-10-CM | POA: Diagnosis not present

## 2017-05-25 DIAGNOSIS — Z23 Encounter for immunization: Secondary | ICD-10-CM | POA: Diagnosis not present

## 2017-05-25 DIAGNOSIS — Z6832 Body mass index (BMI) 32.0-32.9, adult: Secondary | ICD-10-CM | POA: Diagnosis not present

## 2017-05-25 DIAGNOSIS — I10 Essential (primary) hypertension: Secondary | ICD-10-CM | POA: Diagnosis not present

## 2017-05-25 DIAGNOSIS — E1165 Type 2 diabetes mellitus with hyperglycemia: Secondary | ICD-10-CM | POA: Diagnosis not present

## 2017-05-25 DIAGNOSIS — I2510 Atherosclerotic heart disease of native coronary artery without angina pectoris: Secondary | ICD-10-CM | POA: Diagnosis not present

## 2017-05-25 DIAGNOSIS — Z299 Encounter for prophylactic measures, unspecified: Secondary | ICD-10-CM | POA: Diagnosis not present

## 2017-06-07 DIAGNOSIS — E7800 Pure hypercholesterolemia, unspecified: Secondary | ICD-10-CM | POA: Diagnosis not present

## 2017-06-07 DIAGNOSIS — I10 Essential (primary) hypertension: Secondary | ICD-10-CM | POA: Diagnosis not present

## 2017-06-07 DIAGNOSIS — E119 Type 2 diabetes mellitus without complications: Secondary | ICD-10-CM | POA: Diagnosis not present

## 2017-06-14 DIAGNOSIS — L110 Acquired keratosis follicularis: Secondary | ICD-10-CM | POA: Diagnosis not present

## 2017-06-14 DIAGNOSIS — L609 Nail disorder, unspecified: Secondary | ICD-10-CM | POA: Diagnosis not present

## 2017-06-14 DIAGNOSIS — E1140 Type 2 diabetes mellitus with diabetic neuropathy, unspecified: Secondary | ICD-10-CM | POA: Diagnosis not present

## 2017-06-22 DIAGNOSIS — I10 Essential (primary) hypertension: Secondary | ICD-10-CM | POA: Diagnosis not present

## 2017-06-22 DIAGNOSIS — E7800 Pure hypercholesterolemia, unspecified: Secondary | ICD-10-CM | POA: Diagnosis not present

## 2017-06-22 DIAGNOSIS — E119 Type 2 diabetes mellitus without complications: Secondary | ICD-10-CM | POA: Diagnosis not present

## 2017-06-25 DIAGNOSIS — M9901 Segmental and somatic dysfunction of cervical region: Secondary | ICD-10-CM | POA: Diagnosis not present

## 2017-06-25 DIAGNOSIS — M62838 Other muscle spasm: Secondary | ICD-10-CM | POA: Diagnosis not present

## 2017-06-25 DIAGNOSIS — M9902 Segmental and somatic dysfunction of thoracic region: Secondary | ICD-10-CM | POA: Diagnosis not present

## 2017-06-25 DIAGNOSIS — M9903 Segmental and somatic dysfunction of lumbar region: Secondary | ICD-10-CM | POA: Diagnosis not present

## 2017-06-27 DIAGNOSIS — M9901 Segmental and somatic dysfunction of cervical region: Secondary | ICD-10-CM | POA: Diagnosis not present

## 2017-06-27 DIAGNOSIS — M9902 Segmental and somatic dysfunction of thoracic region: Secondary | ICD-10-CM | POA: Diagnosis not present

## 2017-06-27 DIAGNOSIS — M9903 Segmental and somatic dysfunction of lumbar region: Secondary | ICD-10-CM | POA: Diagnosis not present

## 2017-06-27 DIAGNOSIS — M62838 Other muscle spasm: Secondary | ICD-10-CM | POA: Diagnosis not present

## 2017-07-02 DIAGNOSIS — M9902 Segmental and somatic dysfunction of thoracic region: Secondary | ICD-10-CM | POA: Diagnosis not present

## 2017-07-02 DIAGNOSIS — M9903 Segmental and somatic dysfunction of lumbar region: Secondary | ICD-10-CM | POA: Diagnosis not present

## 2017-07-02 DIAGNOSIS — M9901 Segmental and somatic dysfunction of cervical region: Secondary | ICD-10-CM | POA: Diagnosis not present

## 2017-07-02 DIAGNOSIS — M62838 Other muscle spasm: Secondary | ICD-10-CM | POA: Diagnosis not present

## 2017-07-04 DIAGNOSIS — M9903 Segmental and somatic dysfunction of lumbar region: Secondary | ICD-10-CM | POA: Diagnosis not present

## 2017-07-04 DIAGNOSIS — M9902 Segmental and somatic dysfunction of thoracic region: Secondary | ICD-10-CM | POA: Diagnosis not present

## 2017-07-04 DIAGNOSIS — M62838 Other muscle spasm: Secondary | ICD-10-CM | POA: Diagnosis not present

## 2017-07-04 DIAGNOSIS — M9901 Segmental and somatic dysfunction of cervical region: Secondary | ICD-10-CM | POA: Diagnosis not present

## 2017-07-06 DIAGNOSIS — I10 Essential (primary) hypertension: Secondary | ICD-10-CM | POA: Diagnosis not present

## 2017-07-06 DIAGNOSIS — I2510 Atherosclerotic heart disease of native coronary artery without angina pectoris: Secondary | ICD-10-CM | POA: Diagnosis not present

## 2017-07-06 DIAGNOSIS — Z6832 Body mass index (BMI) 32.0-32.9, adult: Secondary | ICD-10-CM | POA: Diagnosis not present

## 2017-07-06 DIAGNOSIS — E7800 Pure hypercholesterolemia, unspecified: Secondary | ICD-10-CM | POA: Diagnosis not present

## 2017-07-06 DIAGNOSIS — E1165 Type 2 diabetes mellitus with hyperglycemia: Secondary | ICD-10-CM | POA: Diagnosis not present

## 2017-07-06 DIAGNOSIS — Z299 Encounter for prophylactic measures, unspecified: Secondary | ICD-10-CM | POA: Diagnosis not present

## 2017-07-09 DIAGNOSIS — M62838 Other muscle spasm: Secondary | ICD-10-CM | POA: Diagnosis not present

## 2017-07-09 DIAGNOSIS — M9903 Segmental and somatic dysfunction of lumbar region: Secondary | ICD-10-CM | POA: Diagnosis not present

## 2017-07-09 DIAGNOSIS — M9901 Segmental and somatic dysfunction of cervical region: Secondary | ICD-10-CM | POA: Diagnosis not present

## 2017-07-09 DIAGNOSIS — M9902 Segmental and somatic dysfunction of thoracic region: Secondary | ICD-10-CM | POA: Diagnosis not present

## 2017-07-16 DIAGNOSIS — M9903 Segmental and somatic dysfunction of lumbar region: Secondary | ICD-10-CM | POA: Diagnosis not present

## 2017-07-16 DIAGNOSIS — M9901 Segmental and somatic dysfunction of cervical region: Secondary | ICD-10-CM | POA: Diagnosis not present

## 2017-07-16 DIAGNOSIS — M9902 Segmental and somatic dysfunction of thoracic region: Secondary | ICD-10-CM | POA: Diagnosis not present

## 2017-07-16 DIAGNOSIS — M62838 Other muscle spasm: Secondary | ICD-10-CM | POA: Diagnosis not present

## 2017-07-25 DIAGNOSIS — M62838 Other muscle spasm: Secondary | ICD-10-CM | POA: Diagnosis not present

## 2017-07-25 DIAGNOSIS — I10 Essential (primary) hypertension: Secondary | ICD-10-CM | POA: Diagnosis not present

## 2017-07-25 DIAGNOSIS — E7800 Pure hypercholesterolemia, unspecified: Secondary | ICD-10-CM | POA: Diagnosis not present

## 2017-07-25 DIAGNOSIS — E119 Type 2 diabetes mellitus without complications: Secondary | ICD-10-CM | POA: Diagnosis not present

## 2017-07-25 DIAGNOSIS — M9903 Segmental and somatic dysfunction of lumbar region: Secondary | ICD-10-CM | POA: Diagnosis not present

## 2017-07-25 DIAGNOSIS — M9902 Segmental and somatic dysfunction of thoracic region: Secondary | ICD-10-CM | POA: Diagnosis not present

## 2017-07-26 ENCOUNTER — Other Ambulatory Visit: Payer: Self-pay | Admitting: Cardiovascular Disease

## 2017-07-26 MED ORDER — FENOFIBRATE 145 MG PO TABS: 145.0000 mg | ORAL_TABLET | Freq: Every day | ORAL | 3 refills | Status: DC

## 2017-07-26 NOTE — Telephone Encounter (Signed)
°*  STAT* If patient is at the pharmacy, call can be transferred to refill team.   1. Which medications need to be refilled? (please list name of each medication and dose if known) need a new prescription-changing pharmacy-Fenofibrate  2. Which pharmacy/location (including street and city if local pharmacy) is medication to be sent to?[facility]-Fax [phone]  3. Do they need a 30 day or 90 day supply? 30 and refills

## 2017-07-26 NOTE — Telephone Encounter (Signed)
Spoke with pt letting him know rx will have to be take to the [place] office, rx print out and put in Dr Evette Georges box to be sign and mail out to pt home.[name]

## 2017-08-01 DIAGNOSIS — M9903 Segmental and somatic dysfunction of lumbar region: Secondary | ICD-10-CM | POA: Diagnosis not present

## 2017-08-01 DIAGNOSIS — M9902 Segmental and somatic dysfunction of thoracic region: Secondary | ICD-10-CM | POA: Diagnosis not present

## 2017-08-01 DIAGNOSIS — M62838 Other muscle spasm: Secondary | ICD-10-CM | POA: Diagnosis not present

## 2017-08-02 ENCOUNTER — Telehealth: Payer: Self-pay | Admitting: Cardiovascular Disease

## 2017-08-02 NOTE — Telephone Encounter (Signed)
New message   Patient calling because he was told a prescription for fenofibrate (TRICOR) 145 MG tablet was mailed to him last week. He has not received as of today. Please confirm script was mailed to patient home address. Advised pt if he gets prescription in the mail today, please call and let staff know.

## 2017-08-02 NOTE — Telephone Encounter (Signed)
Spoke to patient. He needs med renewal through [place], hence request for printed prescription.  Informed him Rx was printed 1 week ago, I cannot confirm this was mailed out as I didn't have documentation to verify that. I did affirm that his address in system is correct, as listed. Informed patient I would route to [facility], Dr. Evette Georges nurse, to see if she remembers this being mailed. Informed him that often our mail takes several business days to be delivered. He voices understanding. He still has a few days worth of this medication and didn't want to run out. He understands we can send a local rx electronically if he needs and that he may call back if script not received by weekend. Pt voiced thanks for call.

## 2017-08-06 NOTE — Telephone Encounter (Signed)
Rx was left in Dr. Evette Georges box to sign and mail.  This was signed by [name] and mailed last week.     Called and left message (ok per DPR) to make patient aware.

## 2017-08-15 DIAGNOSIS — E7800 Pure hypercholesterolemia, unspecified: Secondary | ICD-10-CM | POA: Diagnosis not present

## 2017-08-15 DIAGNOSIS — M9903 Segmental and somatic dysfunction of lumbar region: Secondary | ICD-10-CM | POA: Diagnosis not present

## 2017-08-15 DIAGNOSIS — E119 Type 2 diabetes mellitus without complications: Secondary | ICD-10-CM | POA: Diagnosis not present

## 2017-08-15 DIAGNOSIS — M62838 Other muscle spasm: Secondary | ICD-10-CM | POA: Diagnosis not present

## 2017-08-15 DIAGNOSIS — I10 Essential (primary) hypertension: Secondary | ICD-10-CM | POA: Diagnosis not present

## 2017-08-15 DIAGNOSIS — M9902 Segmental and somatic dysfunction of thoracic region: Secondary | ICD-10-CM | POA: Diagnosis not present

## 2017-08-21 DIAGNOSIS — M9903 Segmental and somatic dysfunction of lumbar region: Secondary | ICD-10-CM | POA: Diagnosis not present

## 2017-08-21 DIAGNOSIS — M9902 Segmental and somatic dysfunction of thoracic region: Secondary | ICD-10-CM | POA: Diagnosis not present

## 2017-08-21 DIAGNOSIS — M62838 Other muscle spasm: Secondary | ICD-10-CM | POA: Diagnosis not present

## 2017-08-30 DIAGNOSIS — L609 Nail disorder, unspecified: Secondary | ICD-10-CM | POA: Diagnosis not present

## 2017-08-30 DIAGNOSIS — L110 Acquired keratosis follicularis: Secondary | ICD-10-CM | POA: Diagnosis not present

## 2017-08-30 DIAGNOSIS — E1140 Type 2 diabetes mellitus with diabetic neuropathy, unspecified: Secondary | ICD-10-CM | POA: Diagnosis not present

## 2017-08-31 DIAGNOSIS — I10 Essential (primary) hypertension: Secondary | ICD-10-CM | POA: Diagnosis not present

## 2017-08-31 DIAGNOSIS — R109 Unspecified abdominal pain: Secondary | ICD-10-CM | POA: Diagnosis not present

## 2017-08-31 DIAGNOSIS — G4730 Sleep apnea, unspecified: Secondary | ICD-10-CM | POA: Diagnosis not present

## 2017-08-31 DIAGNOSIS — E1165 Type 2 diabetes mellitus with hyperglycemia: Secondary | ICD-10-CM | POA: Diagnosis not present

## 2017-08-31 DIAGNOSIS — E7800 Pure hypercholesterolemia, unspecified: Secondary | ICD-10-CM | POA: Diagnosis not present

## 2017-08-31 DIAGNOSIS — K5732 Diverticulitis of large intestine without perforation or abscess without bleeding: Secondary | ICD-10-CM | POA: Diagnosis not present

## 2017-08-31 DIAGNOSIS — Z789 Other specified health status: Secondary | ICD-10-CM | POA: Diagnosis not present

## 2017-08-31 DIAGNOSIS — Z299 Encounter for prophylactic measures, unspecified: Secondary | ICD-10-CM | POA: Diagnosis not present

## 2017-09-10 ENCOUNTER — Other Ambulatory Visit: Payer: Self-pay | Admitting: Cardiovascular Disease

## 2017-09-10 DIAGNOSIS — E785 Hyperlipidemia, unspecified: Secondary | ICD-10-CM | POA: Diagnosis not present

## 2017-09-10 MED ORDER — CLOPIDOGREL BISULFATE 75 MG PO TABS: 75.0000 mg | ORAL_TABLET | Freq: Every day | ORAL | 3 refills | Status: DC

## 2017-09-11 ENCOUNTER — Telehealth: Payer: Self-pay | Admitting: Cardiovascular Disease

## 2017-09-11 LAB — LIPID PANEL
CHOL/HDL RATIO: 3.8 ratio (ref 0.0–5.0)
Cholesterol, Total: 133 mg/dL (ref 100–199)
HDL: 35 mg/dL — ABNORMAL LOW (ref >39)
LDL Calculated: 80 mg/dL (ref 0–99)
TRIGLYCERIDES: 92 mg/dL (ref 0–149)
VLDL Cholesterol Cal: 18 mg/dL (ref 5–40)

## 2017-09-11 NOTE — Telephone Encounter (Signed)
New Message:    Pt wants to know if you have received paperwork from [facility]?He says it is in regards to Medicare Audit for his C-Pap.

## 2017-09-11 NOTE — Telephone Encounter (Signed)
Spoke to patient. Patient states he has 3 issue 1) [employer] was sending information FOR  SUPPLIES FOR C-PAP  - informed patient office has not received  - fax number given again -- [phone]. 2) patient states the [facility] has faxed request for more information on the patient needing to take fenofibrate.  ( need office note?)  phone [phone]  [facility]   Contact jennifer richardson np  , fax [phone] ( RN SPOKE [place] FAXED LAST OFFICE NOTE) 3) patient also states he had lab done at [facility] yesterday in [place]. Labs have not been placed in computer.  Phone [phone] will contact  Have lab faxed

## 2017-09-11 NOTE — Telephone Encounter (Signed)
Informed patient .  Office note was faxed to [place].  Office received information from [place] home [facility]- dealing with cpap  Awaiting on lab results from [facility]  Patient verbalized understanding

## 2017-09-14 ENCOUNTER — Other Ambulatory Visit: Payer: Self-pay | Admitting: *Deleted

## 2017-09-14 MED ORDER — CLOPIDOGREL BISULFATE 75 MG PO TABS: 75.0000 mg | ORAL_TABLET | Freq: Every day | ORAL | 0 refills | Status: DC

## 2017-09-18 ENCOUNTER — Encounter: Payer: Self-pay | Admitting: Cardiovascular Disease

## 2017-09-18 ENCOUNTER — Ambulatory Visit (INDEPENDENT_AMBULATORY_CARE_PROVIDER_SITE_OTHER): Payer: Medicare Other | Admitting: Cardiovascular Disease

## 2017-09-18 VITALS — BP 130/66 | HR 51 | Resp 16 | Ht 74.0 in | Wt 242.4 lb

## 2017-09-18 DIAGNOSIS — E669 Obesity, unspecified: Secondary | ICD-10-CM | POA: Diagnosis not present

## 2017-09-18 DIAGNOSIS — I25810 Atherosclerosis of coronary artery bypass graft(s) without angina pectoris: Secondary | ICD-10-CM | POA: Diagnosis not present

## 2017-09-18 DIAGNOSIS — E118 Type 2 diabetes mellitus with unspecified complications: Secondary | ICD-10-CM

## 2017-09-18 DIAGNOSIS — Z9989 Dependence on other enabling machines and devices: Secondary | ICD-10-CM | POA: Diagnosis not present

## 2017-09-18 DIAGNOSIS — I10 Essential (primary) hypertension: Secondary | ICD-10-CM | POA: Diagnosis not present

## 2017-09-18 DIAGNOSIS — E782 Mixed hyperlipidemia: Secondary | ICD-10-CM

## 2017-09-18 DIAGNOSIS — G4733 Obstructive sleep apnea (adult) (pediatric): Secondary | ICD-10-CM

## 2017-09-18 MED ORDER — CARVEDILOL 25 MG PO TABS: ORAL_TABLET | ORAL | 3 refills | Status: DC

## 2017-09-18 NOTE — Progress Notes (Signed)
Patient ID: William Orr, male   DOB: 1945-09-08, 72 y.o.   MRN: [id number]    Primary M.D.: Dr.A Lora Havens  HPI: William Orr is a 72 year old African American male who presents to the office for a 5 month follow-up cardiology evaluation.  William Orr  has a long history of hypertension,  type 2 diabetes mellitus, and hyperlipidemia. He  underwent an exercise nuclear study in [place] for atypical chest pain which revealed normal perfusion with an ejection fraction of 61%. There was no evidence for ischemia and he had normal wall motion. He was hypertensive throughout the study.  He has been maintained on blood pressure medicines consisting of diltiazem 300 mg daily, carvedilol 25 mg twice a day, and losartan HCT 100/25 mg. He previously, his had taken simvastatin 20 mg for hyperlipidemia  but self discontinued this.  Recently, he has been taking over-the-counter supplements with fair amount of omega-3 fatty acids for elevated triglycerides.[name] He takes Glucophage 850 mg in the morning and 2 tablets at night.   He has had some instances of atypical, probable musculoskeletal chest pain which improved with nonsteroidal therapy.  He underwent a 2-D echo Doppler study on 09/25/2013 and this revealed an ejection fraction of 60-65% with mild focal basal hypertrophy of the septum had normal diastolic parameters. There was moderate thickening of the trileaflet aortic valve consistent with aortic valve sclerosis. He had mild left atrial dilatation. His right ventricle was upper normal to mildly dilated.  Due to my concerns that he may have significant obstructive sleep apnea he underwent a diagnostic polysomnogram in March 2015 which revealed mild sleep apnea overall, with an AHI of 5.8, but he had moderate sleep apnea during REM sleep with an AHI of 25/hr.  He also had significant oxygen desaturation to 84% with non-REM sleep and 76% with REM sleep and had evidence for very loud snoring.  His periodic limb  movement index was 6.7 per hour with 9.5 per hour to arousal.  He underwent CPAP titration and 11 cm pressure was recommended.  He has a ResMed AirSense10 AutoSet.  A download from 03/11/2014 through 04/09/2014 revealed that he is meeting Medicare compliance with 100% days of the device usage.  97% of the time was used at greater than 4 hours.  With his current setting, AHI is excellent at 1.0.  His were index was 0.4.  He's using a fullface mask.  His DME company is [employer] in [place], [place].  Subjectively, since initiating therapy.  He feels markedly improved.  He denies daytime sleepiness.  He denies breakthrough snoring.  His sleep is more restorative.  He has more energy.  He denies hypersomnolence.  Over the last year, he has continued to use CPAP but states there are occasional nights where he falls asleep before putting on his CPAP unit.  He does not sleep as well most nights and oftentimes wakes up several more times to go to the bathroom. Recently, he has noticed that he may experience some increased palpitations in the morning.  He has not been able to correlate if this is related to potential night sweats.   A download was obtained from 09/27/2015 through 12/25/2015.  This showed 77% of days of use with 69% of the days greater than 4 hours.  He was averaging 5 hours and 42 minutes of CPAP use.  His AHI at 11 cm water pressure remained excellent at 0.7.    A 2-D echo Doppler study revealed an EF of 60-65%.  There was grade 2 diastolic dysfunction.  There was aortic valve sclerosis without stenosis or regurgitation.  There was suggestion that there was mildly reduced RV function.  Since I last saw him, he was admitted to [facility] with unstable angina and underwent catheterization on 01/02/2017 in the setting of a non-ST segment MI.  He was found to have multifocal high-grade disease with complex bifurcation stenosis of his proximal LAD and diagonal.  He underwent CABG revascularization  surgery by Dr. [name] on 01/04/2017 and had a LIMA placed to his distal LAD, vein graft to the first diagonal vessel, and vein graft to the second diagonal vessel.  He was seen in follow-up by William Orr, William Orr.  He had significant hypertriglyceridemia and was started on lovaza and due to myalgias with Crestor statin therapy was changed to simvastatin, which he has tolerated.  He completed 8 weeks of cardiac rehabilitation with his last exercise this morning.  Typically his blood pressure at cardiac rehabilitation systolically is in the 846N to 130s.  He denies any recurrent chest pain or palpitations.  He continues to use CPAP with 100% compliance with [employer] as his DME company.  He's not had a recent download.    When I saw him in 72 September 2018.  His blood pressure was elevated on losartan 100 mg, diltiazem 3 mg, and carvedilol 25 Mill grams twice a day.  He was taken [place] X3 omega-3 fatty acid, which included 1.125 g of EPA and to 75 mg of DHA.  Lipid studies were improved.  Her triglycerides were still elevated at 340, down from 413.  Fenofibrate 145 to his medical regimen.  Over the past several months, he has felt well.  He's been without chest pain.  He uses CPAP with 100% compliance.  He denies chest pain, PND, orthopnea.  He saw Dr. Brigitte Pulse and his hemoglobin A1c was 6.0.  He is now is off statins due to statin intolerance.  He presents for reevaluation  Past Medical History:  Diagnosis Date  . Diabetes mellitus without complication ([place])   . History of hiatal hernia   . Hyperlipidemia   . Hypertension   . S/P CABG x 3 01/04/2017   LIMA to LAD, Sequential SVG to D1-D2, EVH via right thigh    Past Surgical History:  Procedure Laterality Date  . CORONARY ARTERY BYPASS GRAFT N/A 01/04/2017   Procedure: CORONARY ARTERY BYPASS GRAFTING (CABG)X3, ON PUMP, USING LEFT INTERNAL MAMMARY ARTERY AND RIGHT GREATER SAPHENOUS VEIN HARVESTED ENDOSCOPICALLY, LIMA-LAD, SEQ SVG- DIAG 1 -DIAG 2;  Surgeon: William Alberts, MD;  Location: [place];  Service: Open Heart Surgery;  Laterality: N/A;  . KNEE ARTHROSCOPY Bilateral   . LEFT HEART CATH AND CORONARY ANGIOGRAPHY N/A 01/02/2017   Procedure: Left Heart Cath and Coronary Angiography;  Surgeon: [name], MD;  Location: [place] [facility];  Service: Cardiovascular;  Laterality: N/A;  . TEE WITHOUT CARDIOVERSION N/A 01/04/2017   Procedure: TRANSESOPHAGEAL ECHOCARDIOGRAM (TEE);  Surgeon: William Alberts, MD;  Location: [facility];  Service: Open Heart Surgery;  Laterality: N/A;    Allergies  Allergen Reactions  . Lisinopril Cough    Current Outpatient Medications  Medication Sig Dispense Refill  . acetaminophen (TYLENOL) 650 MG CR tablet Take 650 mg by mouth every 8 (eight) hours as needed for pain.    [name] aspirin EC 81 MG EC tablet Take 1 tablet (81 mg total) by mouth daily.    . carvedilol (COREG) 25 MG tablet Take  1 tablet (25 mg total) by mouth every morning AND 0.5 tablets (12.5 mg total) at bedtime. 135 tablet 3  . Cholecalciferol (VITAMIN D-3) 5000 UNITS TABS Take 10,000 Units by mouth daily.     . clopidogrel (PLAVIX) 75 MG tablet Take 1 tablet (75 mg total) by mouth daily. 90 tablet 0  . diltiazem (TIAZAC) 300 MG 24 hr capsule Take 300 mg by mouth at bedtime.     . fenofibrate (TRICOR) 145 MG tablet Take 1 tablet (145 mg total) by mouth daily. 90 tablet 3  . glipiZIDE (GLUCOTROL) 10 MG tablet Take 15 mg by mouth 2 (two) times daily.     [name] losartan (COZAAR) 100 MG tablet Take 100 mg by mouth daily.    . metFORMIN (GLUCOPHAGE) 850 MG tablet Take 850 mg by mouth 2 (two) times daily with a meal.     . Multiple Vitamin (MULTIVITAMIN WITH MINERALS) TABS tablet Take 2 tablets by mouth daily.    . Omega-3 Fatty Acids (FISH OIL PO) Take 2 capsules by mouth daily. 1550 mg    . OVER THE COUNTER MEDICATION Use as directed 1 capsule in the mouth or throat 3 (three) times daily. Total Restore    . OVER THE COUNTER MEDICATION Use as directed 2 capsules in  the mouth or throat daily. BP Optimizer    . OVER THE COUNTER MEDICATION Use as directed 1 capsule in the mouth or throat daily. Longevity Activator w/ Resveratrol    . ranitidine (ZANTAC) 150 MG tablet Take 150 mg by mouth daily before breakfast.    . sodium chloride (OCEAN) 0.65 % SOLN nasal spray Place 1 spray into both nostrils 2 (two) times daily as needed for congestion.    . vitamin B-12 (CYANOCOBALAMIN) 1000 MCG tablet Take 1,000 mcg by mouth daily.    . fluticasone (FLONASE) 50 MCG/ACT nasal spray Place 1 spray into both nostrils daily as needed (seasonal allergies).      No current facility-administered medications for this visit.     Social history is notable he is married for 45 years. He has 2 children and 3 grandchildren. 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 There is no history of tobacco use. There is no alcohol use.  Family History  Problem Relation Age of Onset  . Diabetes Mother   . Cancer - Prostate Father   . Rheum arthritis Sister   . Diabetes Brother   . Arthritis Brother   . Cancer - Other Brother        pancreatic  . Rheum arthritis Brother   . Diabetes type II Son    ROS General: Negative; No fevers, chills, or night sweats;  HEENT: Negative; No changes in vision or hearing, sinus congestion, difficulty swallowing Pulmonary: Negative; No cough, wheezing, shortness of breath, hemoptysis Cardiovascular: Occasional atypical chest pain; No presyncope, syncope, palpitations GI: Positive for hiatal hernia; No nausea, vomiting, diarrhea, or abdominal pain GU: Negative; No dysuria, hematuria, or difficulty voiding Musculoskeletal: Negative; no myalgias, joint pain, or weakness Hematologic/Oncology: Negative; no easy bruising, bleeding Endocrine: Positive for diabetes; no heat/cold intolerance; Neuro: Negative; no changes in balance, headaches Skin: Negative; No rashes or  skin lesions Psychiatric: Negative; No behavioral problems, depression Sleep: since starting CPAP therapy no snoring, daytime sleepiness, hypersomnolence, bruxism, restless legs, hypnogognic hallucinations, no cataplexy Other comprehensive 14 point system review is negative.   PE BP 130/66  Pulse (!) 51   Resp 16   Ht '6\' 2"'$  (1.88 m)   Wt 242 lb 6.4 oz (110 kg)   SpO2 97%   BMI 31.12 kg/m    Wt Readings from Last 3 Encounters:  09/18/17 242 lb 6.4 oz (110 kg)  05/04/17 246 lb (111.6 kg)  04/09/17 240 lb (108.9 kg)   General: Alert, oriented, no distress.  Skin: normal turgor, no rashes, warm and dry HEENT: Normocephalic, atraumatic. Pupils equal round and reactive to light; sclera anicteric; extraocular muscles intact;  Nose without nasal septal hypertrophy Mouth/Parynx benign; Mallinpatti scale 3 Neck: No JVD, no carotid bruits; normal carotid upstroke Lungs: clear to ausculatation and percussion; no wheezing or rales Chest wall: without tenderness to palpitation Heart: PMI not displaced, RRR, s1 s2 normal, 1/6 systolic murmur, no diastolic murmur, no rubs, gallops, thrills, or heaves Abdomen: soft, nontender; no hepatosplenomehaly, BS+; abdominal aorta nontender and not dilated by palpation. Back: no CVA tenderness Pulses 2+ Musculoskeletal: full range of motion, normal strength, no joint deformities Extremities: no clubbing cyanosis or edema, Homan's sign negative  Neurologic: grossly nonfocal; Cranial nerves grossly wnl Psychologic: Normal mood and affect   ECG (independently read by me): Sinus bradycardia 52 bpm with mild sinus arrhythmia.  First-degree AV block with a PR interval at 218 ms.  QS complex V1 V2.  September 2018 ECG (independently read by me): Sinus rhythm at 60 bpm with first-degree AV block.  Small Q wave in lead 3.  Nonspecific T changes.  PR interval 220 ms.  May 2018 ECG (independently read by me): Sinus bradycardia 52 bpm.  Q wave in lead 3.  Normal  intervals  January 2017 ECG (independently read by me):  Normal sinus rhythm at 62 with an isolated PVC.  QTC 410 ms.  First degree AV block with a PR interval 226.  Previous ECG from 09/15/13 (independently read by me): Sinus rhythm at 67 beats per minute with borderline first-degree block with PR interval 212 ms.   LABS:  BMP Latest Ref Rng & Units 01/09/2017 01/08/2017 01/07/2017  Glucose 65 - 99 mg/dL 136(H) 171(H) 125(H)  BUN 6 - 20 mg/dL '10 12 14  '$ Creatinine 0.61 - 1.24 mg/dL 0.78 0.81 0.87  BUN/Creat Ratio 10 - 22 - - -  Sodium 135 - 145 mmol/L 136 132(L) 131(L)  Potassium 3.5 - 5.1 mmol/L 3.6 3.9 3.9  Chloride 101 - 111 mmol/L 101 99(L) 100(L)  CO2 22 - 32 mmol/L '26 26 27  '$ Calcium 8.9 - 10.3 mg/dL 8.9 8.6(L) 8.3(L)   Hepatic Function Latest Ref Rng & Units 01/26/2017 01/03/2017 08/27/2015  Total Protein 6.0 - 8.5 g/dL 7.4 7.6 7.1  Albumin 3.6 - 4.8 g/dL 4.3 4.0 4.3  AST 0 - 40 IU/L 25 35 17  ALT 0 - 44 IU/L 32 39 16  Alk Phosphatase 39 - 117 IU/L 80 68 63  Total Bilirubin 0.0 - 1.2 mg/dL <0.2 0.7 0.3  Bilirubin, Direct 0.00 - 0.40 mg/dL 0.09 - -   CBC Latest Ref Rng & Units 01/09/2017 01/08/2017 01/07/2017  WBC 4.0 - 10.5 K/uL 5.7 7.4 10.1  Hemoglobin 13.0 - 17.0 g/dL 9.9(L) 9.3(L) 9.5(L)  Hematocrit 39.0 - 52.0 % 30.4(L) 29.0(L) 29.7(L)  Platelets 150 - 400 K/uL 268 212 173   Lab Results  Component Value Date   MCV 91.8 01/09/2017   MCV 92.4 01/08/2017   MCV 92.0 01/07/2017   Lab Results  Component Value Date   TSH 2.850 08/27/2015  Lab Results  Component Value Date   HGBA1C 7.2 (H) 01/03/2017   Lipid Panel     Component Value Date/Time   CHOL 133 09/10/2017 0944   TRIG 92 09/10/2017 0944   HDL 35 (L) 09/10/2017 0944   CHOLHDL 3.8 09/10/2017 0944   CHOLHDL 4.5 01/01/2017 0042   VLDL UNABLE TO CALCULATE IF TRIGLYCERIDE OVER 400 mg/dL 01/01/2017 0042   LDLCALC 80 09/10/2017 0944     RADIOLOGY: No results found.  IMPRESSION:  1. Essential hypertension    2. Coronary artery disease involving coronary bypass graft of native heart without angina pectoris   3. Mixed hyperlipidemia   4. OSA on CPAP   5. Type 2 diabetes mellitus with complication, without long-term current use of insulin ([place])   6. Mild obesity     ASSESSMENT AND PLAN: Mr. Birks is a 72 year old African American male who has a history of long-standing  hypertension and diabetes mellitus. There is a family history for coronary artery disease with his mother dying from myocardial infarction and who also diabetes mellitus at age 68.  A prior nuclear study had revealed normal perfusion and  An echo Doppler study which revealed normal systolic function without regional wall motion abnormalities.  There was grade 2 diastolic dysfunction.  Abnormal tissue Doppler suggesting elevation of ventricular filling pressures.  He developed new onset unstable angina leading to urgent catheterization on 01/02/2017.  He was found to have a complex bifurcation stenosis with a [place] 011 lesion.  He ultimately underwent CABG surgery with LIMA to his LAD and vein grafts to 2 diagonal vessels.  He has tolerated surgical revascularization well.  He is completing cardiac rehabilitation.  When I last saw on his blood pressure remained mildly elevated.  Presently, blood pressure is stable and on repeat by me was 122/68, on carvedilol 25 g twice a day, diltiazem 300 mg daily, and losartan 100 mg.  He has significant mixed hyperlipidemia.  Apparently he has not been able to tolerate statins and is now on fenofibrate 145 mg in addition to omega-3 fatty acid.  I'm adding Zetia 10 mg to his medical regimen with target LDL less than 70.  His if he is unable to reach target.  We will consider initiation of Repatha per Fourier trial data with improved cardiovascular outcomes.  His resting pulse is now 51, and he does note some fatigue.  I would use carvedilol and he will take 25 Mill grams in the morning but will change his  evening dose to just 12.5 mg daily.  He has obstructive sleep apnea and admits to 100% CPAP use.  He is sleeping well.  His sleep is restorative.  He denies daytime sleepiness.  A residual snoring.  He is diabetic on glipizide and metformin.  He continues on aspirin and Plavix for dual antiplatelet therapy without bleeding.  I will see him in 6 months for reevaluation or sooner from's arise.    Time spent: 25 minutes William Sine, MD, [facility] 09/20/2017 6:29 PM

## 2017-09-18 NOTE — Patient Instructions (Signed)
Medication Instructions:  DECREASE carvedilol (Coreg) to 25 mg in the AM and 12.5 mg in the PM  Follow-Up: Your physician wants you to follow-up in: 6 months with [name].  You will receive a reminder letter in the mail two months in advance. If you don't receive a letter, please call our office to schedule the follow-up appointment.   Any Other Special Instructions Will Be Listed Below (If Applicable).     If you need a refill on your cardiac medications before your next appointment, please call your pharmacy.

## 2017-09-20 ENCOUNTER — Encounter: Payer: Self-pay | Admitting: Cardiovascular Disease

## 2017-10-01 DIAGNOSIS — E7800 Pure hypercholesterolemia, unspecified: Secondary | ICD-10-CM | POA: Diagnosis not present

## 2017-10-01 DIAGNOSIS — E119 Type 2 diabetes mellitus without complications: Secondary | ICD-10-CM | POA: Diagnosis not present

## 2017-10-01 DIAGNOSIS — I10 Essential (primary) hypertension: Secondary | ICD-10-CM | POA: Diagnosis not present

## 2017-10-09 DIAGNOSIS — I2510 Atherosclerotic heart disease of native coronary artery without angina pectoris: Secondary | ICD-10-CM | POA: Diagnosis not present

## 2017-10-09 DIAGNOSIS — Z0000 Encounter for general adult medical examination without abnormal findings: Secondary | ICD-10-CM | POA: Diagnosis not present

## 2017-10-09 DIAGNOSIS — Z79899 Other long term (current) drug therapy: Secondary | ICD-10-CM | POA: Diagnosis not present

## 2017-10-09 DIAGNOSIS — Z1339 Encounter for screening examination for other mental health and behavioral disorders: Secondary | ICD-10-CM | POA: Diagnosis not present

## 2017-10-09 DIAGNOSIS — Z1211 Encounter for screening for malignant neoplasm of colon: Secondary | ICD-10-CM | POA: Diagnosis not present

## 2017-10-09 DIAGNOSIS — I10 Essential (primary) hypertension: Secondary | ICD-10-CM | POA: Diagnosis not present

## 2017-10-09 DIAGNOSIS — Z6831 Body mass index (BMI) 31.0-31.9, adult: Secondary | ICD-10-CM | POA: Diagnosis not present

## 2017-10-09 DIAGNOSIS — Z299 Encounter for prophylactic measures, unspecified: Secondary | ICD-10-CM | POA: Diagnosis not present

## 2017-10-09 DIAGNOSIS — E1165 Type 2 diabetes mellitus with hyperglycemia: Secondary | ICD-10-CM | POA: Diagnosis not present

## 2017-10-09 DIAGNOSIS — Z125 Encounter for screening for malignant neoplasm of prostate: Secondary | ICD-10-CM | POA: Diagnosis not present

## 2017-10-09 DIAGNOSIS — R5383 Other fatigue: Secondary | ICD-10-CM | POA: Diagnosis not present

## 2017-10-09 DIAGNOSIS — N400 Enlarged prostate without lower urinary tract symptoms: Secondary | ICD-10-CM | POA: Diagnosis not present

## 2017-10-09 DIAGNOSIS — Z1331 Encounter for screening for depression: Secondary | ICD-10-CM | POA: Diagnosis not present

## 2017-10-09 DIAGNOSIS — Z7189 Other specified counseling: Secondary | ICD-10-CM | POA: Diagnosis not present

## 2017-10-23 DIAGNOSIS — E7800 Pure hypercholesterolemia, unspecified: Secondary | ICD-10-CM | POA: Diagnosis not present

## 2017-10-23 DIAGNOSIS — E119 Type 2 diabetes mellitus without complications: Secondary | ICD-10-CM | POA: Diagnosis not present

## 2017-10-23 DIAGNOSIS — I10 Essential (primary) hypertension: Secondary | ICD-10-CM | POA: Diagnosis not present

## 2017-12-07 DIAGNOSIS — Z299 Encounter for prophylactic measures, unspecified: Secondary | ICD-10-CM | POA: Diagnosis not present

## 2017-12-07 DIAGNOSIS — J309 Allergic rhinitis, unspecified: Secondary | ICD-10-CM | POA: Diagnosis not present

## 2017-12-07 DIAGNOSIS — I10 Essential (primary) hypertension: Secondary | ICD-10-CM | POA: Diagnosis not present

## 2017-12-07 DIAGNOSIS — I493 Ventricular premature depolarization: Secondary | ICD-10-CM | POA: Diagnosis not present

## 2017-12-07 DIAGNOSIS — Z6832 Body mass index (BMI) 32.0-32.9, adult: Secondary | ICD-10-CM | POA: Diagnosis not present

## 2017-12-07 DIAGNOSIS — E1165 Type 2 diabetes mellitus with hyperglycemia: Secondary | ICD-10-CM | POA: Diagnosis not present

## 2017-12-18 DIAGNOSIS — H9313 Tinnitus, bilateral: Secondary | ICD-10-CM | POA: Diagnosis not present

## 2017-12-18 DIAGNOSIS — J302 Other seasonal allergic rhinitis: Secondary | ICD-10-CM | POA: Diagnosis not present

## 2017-12-18 DIAGNOSIS — H903 Sensorineural hearing loss, bilateral: Secondary | ICD-10-CM | POA: Diagnosis not present

## 2017-12-28 DIAGNOSIS — E119 Type 2 diabetes mellitus without complications: Secondary | ICD-10-CM | POA: Diagnosis not present

## 2017-12-28 DIAGNOSIS — E7800 Pure hypercholesterolemia, unspecified: Secondary | ICD-10-CM | POA: Diagnosis not present

## 2017-12-28 DIAGNOSIS — I10 Essential (primary) hypertension: Secondary | ICD-10-CM | POA: Diagnosis not present

## 2018-01-02 DIAGNOSIS — L609 Nail disorder, unspecified: Secondary | ICD-10-CM | POA: Diagnosis not present

## 2018-01-02 DIAGNOSIS — E1140 Type 2 diabetes mellitus with diabetic neuropathy, unspecified: Secondary | ICD-10-CM | POA: Diagnosis not present

## 2018-01-02 DIAGNOSIS — L110 Acquired keratosis follicularis: Secondary | ICD-10-CM | POA: Diagnosis not present

## 2018-01-14 ENCOUNTER — Ambulatory Visit: Payer: Medicare Other | Admitting: Thoracic Surgery (Cardiothoracic Vascular Surgery)

## 2018-01-14 DIAGNOSIS — M62838 Other muscle spasm: Secondary | ICD-10-CM | POA: Diagnosis not present

## 2018-01-14 DIAGNOSIS — M9902 Segmental and somatic dysfunction of thoracic region: Secondary | ICD-10-CM | POA: Diagnosis not present

## 2018-01-14 DIAGNOSIS — M9901 Segmental and somatic dysfunction of cervical region: Secondary | ICD-10-CM | POA: Diagnosis not present

## 2018-01-14 DIAGNOSIS — M9903 Segmental and somatic dysfunction of lumbar region: Secondary | ICD-10-CM | POA: Diagnosis not present

## 2018-01-18 DIAGNOSIS — E1165 Type 2 diabetes mellitus with hyperglycemia: Secondary | ICD-10-CM | POA: Diagnosis not present

## 2018-01-18 DIAGNOSIS — M9901 Segmental and somatic dysfunction of cervical region: Secondary | ICD-10-CM | POA: Diagnosis not present

## 2018-01-18 DIAGNOSIS — N400 Enlarged prostate without lower urinary tract symptoms: Secondary | ICD-10-CM | POA: Diagnosis not present

## 2018-01-18 DIAGNOSIS — Z299 Encounter for prophylactic measures, unspecified: Secondary | ICD-10-CM | POA: Diagnosis not present

## 2018-01-18 DIAGNOSIS — M62838 Other muscle spasm: Secondary | ICD-10-CM | POA: Diagnosis not present

## 2018-01-18 DIAGNOSIS — M9902 Segmental and somatic dysfunction of thoracic region: Secondary | ICD-10-CM | POA: Diagnosis not present

## 2018-01-18 DIAGNOSIS — I10 Essential (primary) hypertension: Secondary | ICD-10-CM | POA: Diagnosis not present

## 2018-01-18 DIAGNOSIS — Z6832 Body mass index (BMI) 32.0-32.9, adult: Secondary | ICD-10-CM | POA: Diagnosis not present

## 2018-01-18 DIAGNOSIS — G4730 Sleep apnea, unspecified: Secondary | ICD-10-CM | POA: Diagnosis not present

## 2018-01-18 DIAGNOSIS — E7800 Pure hypercholesterolemia, unspecified: Secondary | ICD-10-CM | POA: Diagnosis not present

## 2018-01-18 DIAGNOSIS — M9903 Segmental and somatic dysfunction of lumbar region: Secondary | ICD-10-CM | POA: Diagnosis not present

## 2018-01-23 DIAGNOSIS — M62838 Other muscle spasm: Secondary | ICD-10-CM | POA: Diagnosis not present

## 2018-01-23 DIAGNOSIS — M9903 Segmental and somatic dysfunction of lumbar region: Secondary | ICD-10-CM | POA: Diagnosis not present

## 2018-01-23 DIAGNOSIS — M9902 Segmental and somatic dysfunction of thoracic region: Secondary | ICD-10-CM | POA: Diagnosis not present

## 2018-01-23 DIAGNOSIS — M9901 Segmental and somatic dysfunction of cervical region: Secondary | ICD-10-CM | POA: Diagnosis not present

## 2018-01-28 DIAGNOSIS — M62838 Other muscle spasm: Secondary | ICD-10-CM | POA: Diagnosis not present

## 2018-01-28 DIAGNOSIS — M9901 Segmental and somatic dysfunction of cervical region: Secondary | ICD-10-CM | POA: Diagnosis not present

## 2018-01-28 DIAGNOSIS — M9902 Segmental and somatic dysfunction of thoracic region: Secondary | ICD-10-CM | POA: Diagnosis not present

## 2018-01-28 DIAGNOSIS — M9903 Segmental and somatic dysfunction of lumbar region: Secondary | ICD-10-CM | POA: Diagnosis not present

## 2018-02-11 ENCOUNTER — Encounter: Payer: Self-pay | Admitting: Thoracic Surgery (Cardiothoracic Vascular Surgery)

## 2018-02-11 ENCOUNTER — Ambulatory Visit (INDEPENDENT_AMBULATORY_CARE_PROVIDER_SITE_OTHER): Payer: Medicare Other | Admitting: Thoracic Surgery (Cardiothoracic Vascular Surgery)

## 2018-02-11 ENCOUNTER — Other Ambulatory Visit: Payer: Self-pay

## 2018-02-11 VITALS — BP 120/67 | HR 48 | Resp 16 | Ht 74.0 in | Wt 248.0 lb

## 2018-02-11 DIAGNOSIS — I25810 Atherosclerosis of coronary artery bypass graft(s) without angina pectoris: Secondary | ICD-10-CM | POA: Diagnosis not present

## 2018-02-11 DIAGNOSIS — Z951 Presence of aortocoronary bypass graft: Secondary | ICD-10-CM | POA: Diagnosis not present

## 2018-02-11 NOTE — Progress Notes (Signed)
[address]       [place] [address]             [phone]     CARDIOTHORACIC SURGERY OFFICE NOTE  Referring Provider is [name], MD  Primary Cardiologist is Troy Sine, MD PCP is [name], MD   HPI:  Patient is a 72 year old male with history of coronary artery disease, hypertension, hyperlipidemia, type 2 diabetes mellitus, and obstructive sleep apnea who returns to the office today for routine follow-up more than 1 year status post coronary artery bypass grafting x3 on Jan 04, 2017 for severe multivessel coronary artery disease with unstable angina pectoris.  The patient was last here in 2018 at which time he was doing well.  Since then he has done from a cardiac standpoint.  He enrolled and participated in the outpatient cardiac rehab program.  After he completed the program he admits that he basically quit exercising on a regular basis.  He was last seen in follow-up by [name] in office on September 18, 2017 at which time he was doing well.  He was notably intolerant of statins and has been taking other medications.  He remains on dual antiplatelet therapy.  He is on multiple medications for hypertension.  He states that he was recently placed on Jardiance for diabetes.  This has been associated with multiple side effects including a chronic cough, malaise, mild vague tightness across his chest, and a generalized rash in his groin.  He stopped taking Jardiance several days ago.  The rash is improved.  Other symptoms remain but are slowly improving.  He denies any exertional chest pain or chest tightness similar to that which he had prior to his surgery last year.  He denies exertional shortness of breath.  He admits that he is not exercising regularly.  His diabetes is been under fairly good control with recent hemoglobin A1c measured only 6.2.   Current Outpatient Medications  Medication Sig Dispense Refill  . acetaminophen (TYLENOL) 650 MG CR  tablet Take 650 mg by mouth every 8 (eight) hours as needed for pain.    [name] amLODipine (NORVASC) 2.5 MG tablet Take 2.5 mg by mouth daily.    [name] aspirin EC 81 MG EC tablet Take 1 tablet (81 mg total) by mouth daily.    . carvedilol (COREG) 25 MG tablet Take 1 tablet (25 mg total) by mouth every morning AND 0.5 tablets (12.5 mg total) at bedtime. 135 tablet 3  . Cholecalciferol (VITAMIN D-3) 5000 UNITS TABS Take 10,000 Units by mouth daily.     . clopidogrel (PLAVIX) 75 MG tablet Take 1 tablet (75 mg total) by mouth daily. 90 tablet 0  . diltiazem (TIAZAC) 300 MG 24 hr capsule Take 300 mg by mouth at bedtime.     . fenofibrate (TRICOR) 145 MG tablet Take 1 tablet (145 mg total) by mouth daily. 90 tablet 3  . fluticasone (FLONASE) 50 MCG/ACT nasal spray Place 1 spray into both nostrils daily as needed (seasonal allergies).     [name] glipiZIDE (GLUCOTROL) 10 MG tablet Take 15 mg by mouth daily before breakfast.     . losartan (COZAAR) 100 MG tablet Take 100 mg by mouth daily.    . metFORMIN (GLUCOPHAGE) 850 MG tablet Take 850 mg by mouth 2 (two) times daily with a meal.     . Multiple Vitamin (MULTIVITAMIN WITH MINERALS) TABS tablet Take 2 tablets by mouth daily.    [name]  Omega-3 Fatty Acids (FISH OIL PO) Take 2 capsules by mouth daily. 1550 mg    . OVER THE COUNTER MEDICATION Use as directed 1 capsule in the mouth or throat 3 (three) times daily. Total Restore    . OVER THE COUNTER MEDICATION Use as directed 2 capsules in the mouth or throat daily. BP Optimizer    . OVER THE COUNTER MEDICATION Use as directed 1 capsule in the mouth or throat daily. Longevity Activator w/ Resveratrol    . ranitidine (ZANTAC) 150 MG tablet Take 150 mg by mouth daily before breakfast.    . sodium chloride (OCEAN) 0.65 % SOLN nasal spray Place 1 spray into both nostrils 2 (two) times daily as needed for congestion.    . vitamin B-12 (CYANOCOBALAMIN) 1000 MCG tablet Take 1,000 mcg by mouth daily.     No current  facility-administered medications for this visit.       Physical Exam:   BP 120/67 (BP Location: Left Arm, Patient Position: Sitting, Cuff Size: Large)   Pulse (!) 48   Resp 16   Ht 6\' 2"  (1.88 m)   Wt 248 lb (112.5 kg)   SpO2 95% Comment: ON RA  BMI 31.84 kg/m   General:  Well appearing  Chest:   clear  CV:   RRR  Incisions:  Completely healed  Abdomen:  Soft, non-distended, non-tender  Extremities:  Warm, well-perfused   Diagnostic Tests:  n/a   Impression:  Patient is doing very well from a cardiac standpoint more than 1 year status post coronary artery bypass grafting  Plan:  We have not recommended any change the patient's current medications.  I have suggested that the patient follow-up with his primary care physician in the near future if this recent symptoms that seem to be related to Jardiance do not resolve within the next couple of weeks.  I have encouraged the patient to find a way to get some type of exercise on a regular basis.  We have discussed the long-term benefits of regular exercise, heart healthy diet, and tight glycemic management.  All of his questions have been addressed.  In the future he will call and return to see [place] as needed.  I spent in excess of 15 minutes during the conduct of this office consultation and >50% of this time involved direct face-to-face encounter with the patient for counseling and/or coordination of their care.    Valentina Gu. [name], MD 02/11/2018 12:19 PM

## 2018-02-11 NOTE — Patient Instructions (Signed)
Continue all previous medications without any changes at this time  Make every effort to stay physically active, get some type of exercise on a regular basis, and stick to a "heart healthy diet".  The long term benefits for regular exercise and a healthy diet are critically important to your overall health and wellbeing.  Make every effort to keep your diabetes under very tight control.  Follow up closely with your primary care physician or endocrinologist and strive to keep their hemoglobin A1c levels as low as possible, preferably near or below 6.0.  The long term benefits of strict control of diabetes are far reaching and critically important for your overall health and survival.  

## 2018-02-13 DIAGNOSIS — Z299 Encounter for prophylactic measures, unspecified: Secondary | ICD-10-CM | POA: Diagnosis not present

## 2018-02-13 DIAGNOSIS — Z713 Dietary counseling and surveillance: Secondary | ICD-10-CM | POA: Diagnosis not present

## 2018-02-13 DIAGNOSIS — Z6831 Body mass index (BMI) 31.0-31.9, adult: Secondary | ICD-10-CM | POA: Diagnosis not present

## 2018-02-13 DIAGNOSIS — E1165 Type 2 diabetes mellitus with hyperglycemia: Secondary | ICD-10-CM | POA: Diagnosis not present

## 2018-02-13 DIAGNOSIS — I10 Essential (primary) hypertension: Secondary | ICD-10-CM | POA: Diagnosis not present

## 2018-02-15 DIAGNOSIS — E7800 Pure hypercholesterolemia, unspecified: Secondary | ICD-10-CM | POA: Diagnosis not present

## 2018-02-15 DIAGNOSIS — I10 Essential (primary) hypertension: Secondary | ICD-10-CM | POA: Diagnosis not present

## 2018-02-15 DIAGNOSIS — E119 Type 2 diabetes mellitus without complications: Secondary | ICD-10-CM | POA: Diagnosis not present

## 2018-03-09 DIAGNOSIS — E7800 Pure hypercholesterolemia, unspecified: Secondary | ICD-10-CM | POA: Diagnosis not present

## 2018-03-09 DIAGNOSIS — I10 Essential (primary) hypertension: Secondary | ICD-10-CM | POA: Diagnosis not present

## 2018-03-09 DIAGNOSIS — L03113 Cellulitis of right upper limb: Secondary | ICD-10-CM | POA: Diagnosis not present

## 2018-03-09 DIAGNOSIS — W57XXXA Bitten or stung by nonvenomous insect and other nonvenomous arthropods, initial encounter: Secondary | ICD-10-CM | POA: Diagnosis not present

## 2018-03-09 DIAGNOSIS — Z7984 Long term (current) use of oral hypoglycemic drugs: Secondary | ICD-10-CM | POA: Diagnosis not present

## 2018-03-09 DIAGNOSIS — Z79899 Other long term (current) drug therapy: Secondary | ICD-10-CM | POA: Diagnosis not present

## 2018-03-09 DIAGNOSIS — M7021 Olecranon bursitis, right elbow: Secondary | ICD-10-CM | POA: Diagnosis not present

## 2018-03-09 DIAGNOSIS — E119 Type 2 diabetes mellitus without complications: Secondary | ICD-10-CM | POA: Diagnosis not present

## 2018-03-09 DIAGNOSIS — M25521 Pain in right elbow: Secondary | ICD-10-CM | POA: Diagnosis not present

## 2018-03-12 DIAGNOSIS — Z1211 Encounter for screening for malignant neoplasm of colon: Secondary | ICD-10-CM | POA: Diagnosis not present

## 2018-03-12 DIAGNOSIS — R1310 Dysphagia, unspecified: Secondary | ICD-10-CM | POA: Diagnosis not present

## 2018-03-12 DIAGNOSIS — K219 Gastro-esophageal reflux disease without esophagitis: Secondary | ICD-10-CM | POA: Diagnosis not present

## 2018-03-12 DIAGNOSIS — K449 Diaphragmatic hernia without obstruction or gangrene: Secondary | ICD-10-CM | POA: Diagnosis not present

## 2018-03-12 DIAGNOSIS — K5730 Diverticulosis of large intestine without perforation or abscess without bleeding: Secondary | ICD-10-CM | POA: Diagnosis not present

## 2018-03-13 ENCOUNTER — Telehealth: Payer: Self-pay | Admitting: Cardiovascular Disease

## 2018-03-13 NOTE — Telephone Encounter (Signed)
  I spoke with pt via phone regarding clearance for endoscopy, which is scheduled 04/24/18. He has a clinic appt with [name] 04/11/18 for repeat cardiac assessment. Clearance to hold ASA and Plavix will be determined by [name] at [facility] visit. I will notify requesting provider of pending office visit for clearance and will remove from preop pool.

## 2018-03-13 NOTE — Telephone Encounter (Signed)
   [facility] Pre-operative Risk Assessment    Request for surgical clearance:  1. What type of surgery is being performed? Endoscopic procedure    2. When is this surgery scheduled? 04/24/18   3. What type of clearance is required (medical clearance vs. Pharmacy clearance to hold med vs. Both)? Both   4. Are there any medications that need to be held prior to surgery and how long? Plavix, ASA  5. Practice name and name of physician performing surgery?  [facility]   6. What is your office phone number [phone]    7.   What is your office fax number [phone]   8.   Anesthesia type (None, local, MAC, general) ? Not specified    Fidel Levy 03/13/2018, 7:41 AM  _________________________________________________________________   (provider comments below)

## 2018-03-18 DIAGNOSIS — I10 Essential (primary) hypertension: Secondary | ICD-10-CM | POA: Diagnosis not present

## 2018-03-18 DIAGNOSIS — E1165 Type 2 diabetes mellitus with hyperglycemia: Secondary | ICD-10-CM | POA: Diagnosis not present

## 2018-03-18 DIAGNOSIS — Z6831 Body mass index (BMI) 31.0-31.9, adult: Secondary | ICD-10-CM | POA: Diagnosis not present

## 2018-03-18 DIAGNOSIS — Z299 Encounter for prophylactic measures, unspecified: Secondary | ICD-10-CM | POA: Diagnosis not present

## 2018-03-18 DIAGNOSIS — E7800 Pure hypercholesterolemia, unspecified: Secondary | ICD-10-CM | POA: Diagnosis not present

## 2018-03-18 DIAGNOSIS — K219 Gastro-esophageal reflux disease without esophagitis: Secondary | ICD-10-CM | POA: Diagnosis not present

## 2018-03-27 DIAGNOSIS — I10 Essential (primary) hypertension: Secondary | ICD-10-CM | POA: Diagnosis not present

## 2018-03-27 DIAGNOSIS — E119 Type 2 diabetes mellitus without complications: Secondary | ICD-10-CM | POA: Diagnosis not present

## 2018-03-27 DIAGNOSIS — E7800 Pure hypercholesterolemia, unspecified: Secondary | ICD-10-CM | POA: Diagnosis not present

## 2018-03-28 DIAGNOSIS — E1140 Type 2 diabetes mellitus with diabetic neuropathy, unspecified: Secondary | ICD-10-CM | POA: Diagnosis not present

## 2018-03-28 DIAGNOSIS — L609 Nail disorder, unspecified: Secondary | ICD-10-CM | POA: Diagnosis not present

## 2018-03-28 DIAGNOSIS — L110 Acquired keratosis follicularis: Secondary | ICD-10-CM | POA: Diagnosis not present

## 2018-04-11 ENCOUNTER — Encounter: Payer: Self-pay | Admitting: Cardiovascular Disease

## 2018-04-11 ENCOUNTER — Ambulatory Visit (INDEPENDENT_AMBULATORY_CARE_PROVIDER_SITE_OTHER): Payer: Medicare Other | Admitting: Cardiovascular Disease

## 2018-04-11 VITALS — BP 150/62 | HR 47 | Ht 75.0 in | Wt 246.0 lb

## 2018-04-11 DIAGNOSIS — Z9989 Dependence on other enabling machines and devices: Secondary | ICD-10-CM | POA: Diagnosis not present

## 2018-04-11 DIAGNOSIS — I25810 Atherosclerosis of coronary artery bypass graft(s) without angina pectoris: Secondary | ICD-10-CM

## 2018-04-11 DIAGNOSIS — Z79899 Other long term (current) drug therapy: Secondary | ICD-10-CM

## 2018-04-11 DIAGNOSIS — E782 Mixed hyperlipidemia: Secondary | ICD-10-CM

## 2018-04-11 DIAGNOSIS — E119 Type 2 diabetes mellitus without complications: Secondary | ICD-10-CM

## 2018-04-11 DIAGNOSIS — I10 Essential (primary) hypertension: Secondary | ICD-10-CM

## 2018-04-11 DIAGNOSIS — Z951 Presence of aortocoronary bypass graft: Secondary | ICD-10-CM | POA: Diagnosis not present

## 2018-04-11 DIAGNOSIS — G4733 Obstructive sleep apnea (adult) (pediatric): Secondary | ICD-10-CM

## 2018-04-11 DIAGNOSIS — E785 Hyperlipidemia, unspecified: Secondary | ICD-10-CM

## 2018-04-11 MED ORDER — EZETIMIBE 10 MG PO TABS: 10.0000 mg | ORAL_TABLET | Freq: Every day | ORAL | 3 refills | Status: DC

## 2018-04-11 MED ORDER — AMLODIPINE BESYLATE 5 MG PO TABS
5.0000 mg | ORAL_TABLET | Freq: Every day | ORAL | 3 refills | Status: DC
Start: 2018-04-11 — End: 2018-05-24

## 2018-04-11 NOTE — Patient Instructions (Addendum)
Medication Instructions:  INCREASE amlodipine to 5 mg daily START Zetia 10 mg daily  Labwork: Please return for FASTING labs in 3 months (CMET, Lipid)-lab slips to be mailed  Our in office lab hours are Monday-Friday 8:00-4:00, closed for lunch 12:45-1:45 pm.  No appointment needed.  Follow-Up: Your physician wants you to follow-up in: 6 months with [name]. You will receive a reminder letter in the mail two months in advance. If you don't receive a letter, please call our office to schedule the follow-up appointment.  Any Other Special Instructions Will Be Listed Below (If Applicable).  Ok to hold Plavix and ASA 5 days prior to procedure **restart Aspirin ONLY after procedure   If you need a refill on your cardiac medications before your next appointment, please call your pharmacy.

## 2018-04-13 ENCOUNTER — Encounter: Payer: Self-pay | Admitting: Cardiovascular Disease

## 2018-04-13 NOTE — Progress Notes (Signed)
Patient ID: William Orr, male   DOB: 1945-08-29, 72 y.o.   MRN: [id number]    Primary M.D.: Dr.A Lora Havens  HPI: William Orr is a 72 year old African American male who presents to the office for a 6 month follow-up cardiology/sleep evaluation.  [name]  has a long history of hypertension,  type 2 diabetes mellitus, and hyperlipidemia. He  underwent an exercise nuclear study in [place] for atypical chest pain which revealed normal perfusion with an ejection fraction of 61%. There was no evidence for ischemia and he had normal wall motion. He was hypertensive throughout the study.  He has been maintained on blood pressure medicines consisting of diltiazem 300 mg daily, carvedilol 25 mg twice a day, and losartan HCT 100/25 mg. He previously, his had taken simvastatin 20 mg for hyperlipidemia  but self discontinued this.  Recently, he has been taking over-the-counter supplements with fair amount of omega-3 fatty acids for elevated triglycerides.[name] He takes Glucophage 850 mg in the morning and 2 tablets at night.   He has had some instances of atypical, probable musculoskeletal chest pain which improved with nonsteroidal therapy.  He underwent a 2-D echo Doppler study on 09/25/2013 and this revealed an ejection fraction of 60-65% with mild focal basal hypertrophy of the septum had normal diastolic parameters. There was moderate thickening of the trileaflet aortic valve consistent with aortic valve sclerosis. He had mild left atrial dilatation. His right ventricle was upper normal to mildly dilated.  Due to my concerns that he may have significant obstructive sleep apnea he underwent a diagnostic polysomnogram in March 2015 which revealed mild sleep apnea overall, with an AHI of 5.8, but he had moderate sleep apnea during REM sleep with an AHI of 25/hr.  He also had significant oxygen desaturation to 84% with non-REM sleep and 76% with REM sleep and had evidence for very loud snoring.  His  periodic limb movement index was 6.7 per hour with 9.5 per hour to arousal.  He underwent CPAP titration and 11 cm pressure was recommended.  He has a ResMed AirSense10 AutoSet.  A download from 03/11/2014 through 04/09/2014 revealed that he is meeting Medicare compliance with 100% days of the device usage.  97% of the time was used at greater than 4 hours.  With his current setting, AHI is excellent at 1.0.  His were index was 0.4.  He's using a fullface mask.  His DME company is [employer] in [place], [place].  Subjectively, since initiating therapy.  He feels markedly improved.  He denies daytime sleepiness.  He denies breakthrough snoring.  His sleep is more restorative.  He has more energy.  He denies hypersomnolence.  When I saw him in 2017, he  continued to use CPAP but occasional night he would fall asleep before putting on his CPAP unit.  He does not sleep as well most nights and oftentimes wakes up several more times to go to the bathroom. Recently, he has noticed that he may experience some increased palpitations in the morning.  He has not been able to correlate if this is related to potential night sweats.   A download was obtained from 09/27/2015 through 12/25/2015.  This showed 77% of days of use with 69% of the days greater than 4 hours.  He was averaging 5 hours and 42 minutes of CPAP use.  His AHI at 11 cm water pressure remained excellent at 0.7.    A 2-D echo Doppler study revealed an EF of 60-65%.  There  was grade 2 diastolic dysfunction.  There was aortic valve sclerosis without stenosis or regurgitation.  There was suggestion that there was mildly reduced RV function.  Since I last saw him, he was admitted to [facility] with unstable angina and underwent catheterization on 01/02/2017 in the setting of a non-ST segment MI.  He was found to have multifocal high-grade disease with complex bifurcation stenosis of his proximal LAD and diagonal.  He underwent CABG revascularization surgery  by Dr. [name] on 01/04/2017 and had a LIMA placed to his distal LAD, vein graft to the first diagonal vessel, and vein graft to the second diagonal vessel.  He was seen in follow-up by Almyra Deforest, PAC.  He had significant hypertriglyceridemia and was started on lovaza and due to myalgias with Crestor statin therapy was changed to simvastatin, which he has tolerated.  He completed 8 weeks of cardiac rehabilitation with his last exercise this morning.  Typically his blood pressure at cardiac rehabilitation systolically is in the 001V to 130s.  He denies any recurrent chest pain or palpitations.  He continues to use CPAP with 100% compliance with [employer] as his DME company.  He's not had a recent download.    When I saw him in September 2018.  His blood pressure was elevated on losartan 100 mg, diltiazem 3 mg, and carvedilol 25 Mill grams twice a day.  He was taken [place] X3 omega-3 fatty acid, which included 1.125 g of EPA and to 75 mg of DHA.  Lipid studies were improved.  Her triglycerides were still elevated at 340, down from 413.  Fenofibrate 145 to his medical regimen.  Over the past several months, he has felt well.  He's been without chest pain.  He uses CPAP with 100% compliance.  He denies chest pain, PND, orthopnea.  He saw Dr. Brigitte Pulse and his hemoglobin A1c was 6.0.  He is off statins due to statin intolerance.    I last saw him in 2019.  His blood pressure was stable.  He was not having any anginal symptoms.  Is bradycardic and I reduced his low-dose. He was  continuing to use CPAP.  Over the past 6 months, he has remained stable.  He is in need to undergo endoscopy scheduled for April 24, 2018 and clearance is necessary to hold his aspirin and Plavix.  He denies any recurrent anginal symptoms.  He continues to use CPAP.  A download was obtained in April 19 through Dec 29, 2017.  DME company is [employer] in [place] and apparently we were unable to access his information beyond May 18.  However  he still admits to 100% compliance.  On that download he was continuing to meet compliance standards.  CPAP pressure is set at 11 cm of water.  AHI is excellent at 0.7.  He presents for evaluation.  Past Medical History:  Diagnosis Date  . Diabetes mellitus without complication ([place])   . History of hiatal hernia   . Hyperlipidemia   . Hypertension   . S/P CABG x 3 01/04/2017   LIMA to LAD, Sequential SVG to D1-D2, EVH via right thigh    Past Surgical History:  Procedure Laterality Date  . CORONARY ARTERY BYPASS GRAFT N/A 01/04/2017   Procedure: CORONARY ARTERY BYPASS GRAFTING (CABG)X3, ON PUMP, USING LEFT INTERNAL MAMMARY ARTERY AND RIGHT GREATER SAPHENOUS VEIN HARVESTED ENDOSCOPICALLY, LIMA-LAD, SEQ SVG- DIAG 1 -DIAG 2;  Surgeon: [name], MD;  Location: [facility];  Service: Open Heart Surgery;  Laterality: N/A;  . KNEE ARTHROSCOPY Bilateral   . LEFT HEART CATH AND CORONARY ANGIOGRAPHY N/A 01/02/2017   Procedure: Left Heart Cath and Coronary Angiography;  Surgeon: [name], MD;  Location: [facility];  Service: Cardiovascular;  Laterality: N/A;  . TEE WITHOUT CARDIOVERSION N/A 01/04/2017   Procedure: TRANSESOPHAGEAL ECHOCARDIOGRAM (TEE);  Surgeon: [name], MD;  Location: [facility];  Service: Open Heart Surgery;  Laterality: N/A;    Allergies  Allergen Reactions  . Lisinopril Cough    Current Outpatient Medications  Medication Sig Dispense Refill  . linagliptin (TRADJENTA) 5 MG TABS tablet Take 5 mg by mouth daily.    [name] acetaminophen (TYLENOL) 650 MG CR tablet Take 650 mg by mouth every 8 (eight) hours as needed for pain.    [name] amLODipine (NORVASC) 5 MG tablet Take 1 tablet (5 mg total) by mouth daily. 90 tablet 3  . aspirin EC 81 MG EC tablet Take 1 tablet (81 mg total) by mouth daily.    . carvedilol (COREG) 25 MG tablet Take 1 tablet (25 mg total) by mouth every morning AND 0.5 tablets (12.5 mg total) at bedtime. 135 tablet 3  . Cholecalciferol (VITAMIN D-3)  5000 UNITS TABS Take 10,000 Units by mouth daily.     [name] diltiazem (TIAZAC) 300 MG 24 hr capsule Take 300 mg by mouth at bedtime.     [name] ezetimibe (ZETIA) 10 MG tablet Take 1 tablet (10 mg total) by mouth daily. 90 tablet 3  . fenofibrate (TRICOR) 145 MG tablet Take 1 tablet (145 mg total) by mouth daily. 90 tablet 3  . fluticasone (FLONASE) 50 MCG/ACT nasal spray Place 1 spray into both nostrils daily as needed (seasonal allergies).     . losartan (COZAAR) 100 MG tablet Take 100 mg by mouth daily.    . metFORMIN (GLUCOPHAGE) 850 MG tablet Take 850 mg by mouth 2 (two) times daily with a meal.     . Multiple Vitamin (MULTIVITAMIN WITH MINERALS) TABS tablet Take 2 tablets by mouth daily.    . Omega-3 Fatty Acids (FISH OIL PO) Take 2 capsules by mouth daily. 1550 mg    . OVER THE COUNTER MEDICATION Use as directed 1 capsule in the mouth or throat 3 (three) times daily. Total Restore    . OVER THE COUNTER MEDICATION Use as directed 2 capsules in the mouth or throat daily. BP Optimizer    . OVER THE COUNTER MEDICATION Use as directed 1 capsule in the mouth or throat daily. Longevity Activator w/ Resveratrol    . pantoprazole (PROTONIX) 40 MG tablet Take 1 tablet by mouth daily.  6  . ranitidine (ZANTAC) 150 MG tablet Take 150 mg by mouth daily before breakfast.    . sodium chloride (OCEAN) 0.65 % SOLN nasal spray Place 1 spray into both nostrils 2 (two) times daily as needed for congestion.    . vitamin B-12 (CYANOCOBALAMIN) 1000 MCG tablet Take 1,000 mcg by mouth daily.     No current facility-administered medications for this visit.     Social history is notable he is married for 46 years. He has 2 children and 3 grandchildren. 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 There is no history of tobacco use. There is no alcohol use.  Family History  Problem Relation Age of Onset  . Diabetes Mother   . Cancer -  Prostate Father   .  Rheum arthritis Sister   . Diabetes Brother   . Arthritis Brother   . Cancer - Other Brother        pancreatic  . Rheum arthritis Brother   . Diabetes type II Son    ROS General: Negative; No fevers, chills, or night sweats;  HEENT: Negative; No changes in vision or hearing, sinus congestion, difficulty swallowing Pulmonary: Negative; No cough, wheezing, shortness of breath, hemoptysis Cardiovascular: Occasional atypical chest pain; No presyncope, syncope, palpitations GI: Positive for hiatal hernia; No nausea, vomiting, diarrhea, or abdominal pain GU: Negative; No dysuria, hematuria, or difficulty voiding Musculoskeletal: Negative; no myalgias, joint pain, or weakness Hematologic/Oncology: Negative; no easy bruising, bleeding Endocrine: Positive for diabetes; no heat/cold intolerance; Neuro: Negative; no changes in balance, headaches Skin: Negative; No rashes or skin lesions Psychiatric: Negative; No behavioral problems, depression Sleep: since starting CPAP therapy no snoring, daytime sleepiness, hypersomnolence, bruxism, restless legs, hypnogognic hallucinations, no cataplexy Other comprehensive 14 point system review is negative.   PE BP (!) 150/62   Pulse (!) 47   Ht _0  (1.905 m)   Wt 246 lb (111.6 kg)   BMI 30.75 kg/m    Blood pressure by me was 144/68.  Wt Readings from Last 3 Encounters:  04/11/18 246 lb (111.6 kg)  02/11/18 248 lb (112.5 kg)  09/18/17 242 lb 6.4 oz (110 kg)   General: Alert, oriented, no distress.  Skin: normal turgor, no rashes, warm and dry HEENT: Normocephalic, atraumatic. Pupils equal round and reactive to light; sclera anicteric; extraocular muscles intact;  Nose without nasal septal hypertrophy Mouth/Parynx benign; Mallinpatti scale 3 Neck: No JVD, no carotid bruits; normal carotid upstroke Lungs: clear to ausculatation and percussion; no wheezing or rales Chest wall: without tenderness to palpitation Heart: PMI  not displaced, RRR, s1 s2 normal, 1/6 systolic murmur, no diastolic murmur, no rubs, gallops, thrills, or heaves Abdomen: soft, nontender; no hepatosplenomehaly, BS+; abdominal aorta nontender and not dilated by palpation. Back: no CVA tenderness Pulses 2+ Musculoskeletal: full range of motion, normal strength, no joint deformities Extremities: no clubbing cyanosis or edema, Homan's sign negative  Neurologic: grossly nonfocal; Cranial nerves grossly wnl Psychologic: Normal mood and affect  ECG (independently read by me): This bradycardia at 47 bpm.  First-degree AV block.  PR interval 228 ms.  May 2019 ECG (independently read by me): Sinus bradycardia 52 bpm with mild sinus arrhythmia.  First-degree AV block with a PR interval at 218 ms.  QS complex V1 V2.  September 2018 ECG (independently read by me): Sinus rhythm at 60 bpm with first-degree AV block.  Small Q wave in lead 3.  Nonspecific T changes.  PR interval 220 ms.  May 2018 ECG (independently read by me): Sinus bradycardia 52 bpm.  Q wave in lead 3.  Normal intervals  January 2017 ECG (independently read by me):  Normal sinus rhythm at 62 with an isolated PVC.  QTC 410 ms.  First degree AV block with a PR interval 226.  Previous ECG from 09/15/13 (independently read by me): Sinus rhythm at 67 beats per minute with borderline first-degree block with PR interval 212 ms.   LABS:  BMP Latest Ref Rng & Units 01/09/2017 01/08/2017 01/07/2017  Glucose 65 - 99 mg/dL 136(H) 171(H) 125(H)  BUN 6 - 20 mg/dL _1 Creatinine 0.61 - 1.24 mg/dL 0.78 0.81 0.87  BUN/Creat Ratio 10 - 22 - - -  Sodium 135 - 145 mmol/L 136 132(L) 131(L)  Potassium 3.5 -  5.1 mmol/L 3.6 3.9 3.9  Chloride 101 - 111 mmol/L 101 99(L) 100(L)  CO2 22 - 32 mmol/L _0 Calcium 8.9 - 10.3 mg/dL 8.9 8.6(L) 8.3(L)   Hepatic Function Latest Ref Rng & Units 01/26/2017 01/03/2017 08/27/2015  Total Protein 6.0 - 8.5 g/dL 7.4 7.6 7.1  Albumin 3.6 - 4.8 g/dL 4.3 4.0 4.3   AST 0 - 40 IU/L 25 35 17  ALT 0 - 44 IU/L 32 39 16  Alk Phosphatase 39 - 117 IU/L 80 68 63  Total Bilirubin 0.0 - 1.2 mg/dL <0.2 0.7 0.3  Bilirubin, Direct 0.00 - 0.40 mg/dL 0.09 - -   CBC Latest Ref Rng & Units 01/09/2017 01/08/2017 01/07/2017  WBC 4.0 - 10.5 K/uL 5.7 7.4 10.1  Hemoglobin 13.0 - 17.0 g/dL 9.9(L) 9.3(L) 9.5(L)  Hematocrit 39.0 - 52.0 % 30.4(L) 29.0(L) 29.7(L)  Platelets 150 - 400 K/uL 268 212 173   Lab Results  Component Value Date   MCV 91.8 01/09/2017   MCV 92.4 01/08/2017   MCV 92.0 01/07/2017   Lab Results  Component Value Date   TSH 2.850 08/27/2015   Lab Results  Component Value Date   HGBA1C 7.2 (H) 01/03/2017   Lipid Panel     Component Value Date/Time   CHOL 133 09/10/2017 0944   TRIG 92 09/10/2017 0944   HDL 35 (L) 09/10/2017 0944   CHOLHDL 3.8 09/10/2017 0944   CHOLHDL 4.5 01/01/2017 0042   VLDL UNABLE TO CALCULATE IF TRIGLYCERIDE OVER 400 mg/dL 01/01/2017 0042   LDLCALC 80 09/10/2017 0944     RADIOLOGY: No results found.  IMPRESSION:  1. Mixed hyperlipidemia   2. Medication management   3. Coronary artery disease involving coronary bypass graft of native heart without angina pectoris   4. Hx of CABG   5. Essential hypertension   6. OSA on CPAP   7. Hyperlipidemia with target LDL less than 70   8. Controlled type 2 diabetes mellitus without complication, without long-term current use of insulin (HCC)     ASSESSMENT AND PLAN: Mr. Lehrke is a 72 year old African American male who has a history of long-standing  hypertension and diabetes mellitus. There is a family history for coronary artery disease with his mother dying from myocardial infarction and who also diabetes mellitus at age 74.  A prior nuclear study had revealed normal perfusion and  An echo Doppler study which revealed normal systolic function without regional wall motion abnormalities.  There was grade 2 diastolic dysfunction.  Abnormal tissue Doppler suggesting elevation  of ventricular filling pressures.  He developed new onset unstable angina leading to urgent catheterization on 05/22/201 and  was found to have a complex bifurcation stenosis with a [place] 011 lesion.  He ultimately underwent CABG surgery with LIMA to his LAD and vein grafts to 2 diagonal vessels.  Presently, he denies any recurrent anginal symptomatology since his CABG revascularization.  I saw him, he was on labetalol, losartan 100 mg daily, diltiazem 300 mg daily pressure control.  It appears that since that time he has also been started on amlodipine.  I had reduced his carvedilol due to bradycardia and recently he has been taking 25 mg in the morning and 12.5 mg at night.  He is bradycardic on ECG today.  He remains entirely asymptomatic.  I recommend decreasing diltiazem to 240 mg daily with his bradycardia. He may need to be taken off amlodipine since he is on 2 calcium channel blockers.  If blood  pressure remains elevated hydralazine can be considered for more optimal blood pressure treatment.  I have given him clearance to undergo planned endoscopy and he can hold Plavix and aspirin for 5 days.  He continues to use CPAP with 100% compliance. We will contact [facility] in [place] to see if they can provide data regarding his Pap from date beyond May 18 2 presently.  He is sleeping well.  His sleep is restorative.  There is no residual snoring or daytime sleepiness.  He is diabetic on glipizide and metformin.  He apparently is statin intolerance.  I again have recommended initiation of Zetia 10 mg with target LDL less than 70.  I will see him in 6 months for reevaluation.  Troy Sine, MD, [facility] 04/13/2018 12:19 PM

## 2018-04-19 ENCOUNTER — Telehealth: Payer: Self-pay | Admitting: *Deleted

## 2018-04-19 MED ORDER — DILTIAZEM HCL ER BEADS 240 MG PO CP24: 240.0000 mg | ORAL_CAPSULE | Freq: Every day | ORAL | 3 refills | Status: DC

## 2018-04-19 NOTE — Telephone Encounter (Signed)
Per [name], decrease diltiazem to 240 mg daily and follow up in 6 weeks.   Spoke to patient-aware of recommendations and request rx and OV note faxed to [facility], fax # [phone]   6 week follow up appt made 10/11 at 4 pm with [name].

## 2018-04-22 DIAGNOSIS — I10 Essential (primary) hypertension: Secondary | ICD-10-CM | POA: Diagnosis not present

## 2018-04-22 DIAGNOSIS — E7800 Pure hypercholesterolemia, unspecified: Secondary | ICD-10-CM | POA: Diagnosis not present

## 2018-04-22 DIAGNOSIS — E119 Type 2 diabetes mellitus without complications: Secondary | ICD-10-CM | POA: Diagnosis not present

## 2018-04-24 DIAGNOSIS — K209 Esophagitis, unspecified: Secondary | ICD-10-CM | POA: Diagnosis not present

## 2018-04-24 DIAGNOSIS — Z7984 Long term (current) use of oral hypoglycemic drugs: Secondary | ICD-10-CM | POA: Diagnosis not present

## 2018-04-24 DIAGNOSIS — G4730 Sleep apnea, unspecified: Secondary | ICD-10-CM | POA: Diagnosis not present

## 2018-04-24 DIAGNOSIS — E119 Type 2 diabetes mellitus without complications: Secondary | ICD-10-CM | POA: Diagnosis not present

## 2018-04-24 DIAGNOSIS — J449 Chronic obstructive pulmonary disease, unspecified: Secondary | ICD-10-CM | POA: Diagnosis not present

## 2018-04-24 DIAGNOSIS — K2970 Gastritis, unspecified, without bleeding: Secondary | ICD-10-CM | POA: Diagnosis not present

## 2018-04-24 DIAGNOSIS — Z7982 Long term (current) use of aspirin: Secondary | ICD-10-CM | POA: Diagnosis not present

## 2018-04-24 DIAGNOSIS — K2980 Duodenitis without bleeding: Secondary | ICD-10-CM | POA: Diagnosis not present

## 2018-04-24 DIAGNOSIS — R1310 Dysphagia, unspecified: Secondary | ICD-10-CM | POA: Diagnosis not present

## 2018-04-24 DIAGNOSIS — K219 Gastro-esophageal reflux disease without esophagitis: Secondary | ICD-10-CM | POA: Diagnosis not present

## 2018-04-24 DIAGNOSIS — I10 Essential (primary) hypertension: Secondary | ICD-10-CM | POA: Diagnosis not present

## 2018-05-07 DIAGNOSIS — J302 Other seasonal allergic rhinitis: Secondary | ICD-10-CM | POA: Diagnosis not present

## 2018-05-07 DIAGNOSIS — R42 Dizziness and giddiness: Secondary | ICD-10-CM | POA: Diagnosis not present

## 2018-05-07 DIAGNOSIS — H9313 Tinnitus, bilateral: Secondary | ICD-10-CM | POA: Diagnosis not present

## 2018-05-07 DIAGNOSIS — H903 Sensorineural hearing loss, bilateral: Secondary | ICD-10-CM | POA: Diagnosis not present

## 2018-05-21 DIAGNOSIS — E119 Type 2 diabetes mellitus without complications: Secondary | ICD-10-CM | POA: Diagnosis not present

## 2018-05-21 DIAGNOSIS — H43811 Vitreous degeneration, right eye: Secondary | ICD-10-CM | POA: Diagnosis not present

## 2018-05-21 DIAGNOSIS — H2513 Age-related nuclear cataract, bilateral: Secondary | ICD-10-CM | POA: Diagnosis not present

## 2018-05-24 ENCOUNTER — Ambulatory Visit (INDEPENDENT_AMBULATORY_CARE_PROVIDER_SITE_OTHER): Payer: Medicare Other | Admitting: Cardiovascular Disease

## 2018-05-24 ENCOUNTER — Encounter: Payer: Self-pay | Admitting: Cardiovascular Disease

## 2018-05-24 ENCOUNTER — Other Ambulatory Visit: Payer: Self-pay | Admitting: *Deleted

## 2018-05-24 VITALS — BP 130/65 | HR 50 | Ht 74.0 in | Wt 246.4 lb

## 2018-05-24 DIAGNOSIS — I25810 Atherosclerosis of coronary artery bypass graft(s) without angina pectoris: Secondary | ICD-10-CM

## 2018-05-24 DIAGNOSIS — E785 Hyperlipidemia, unspecified: Secondary | ICD-10-CM | POA: Diagnosis not present

## 2018-05-24 DIAGNOSIS — Z9989 Dependence on other enabling machines and devices: Secondary | ICD-10-CM | POA: Diagnosis not present

## 2018-05-24 DIAGNOSIS — E119 Type 2 diabetes mellitus without complications: Secondary | ICD-10-CM

## 2018-05-24 DIAGNOSIS — I10 Essential (primary) hypertension: Secondary | ICD-10-CM | POA: Diagnosis not present

## 2018-05-24 DIAGNOSIS — Z79899 Other long term (current) drug therapy: Secondary | ICD-10-CM

## 2018-05-24 DIAGNOSIS — Z951 Presence of aortocoronary bypass graft: Secondary | ICD-10-CM | POA: Diagnosis not present

## 2018-05-24 DIAGNOSIS — E782 Mixed hyperlipidemia: Secondary | ICD-10-CM

## 2018-05-24 DIAGNOSIS — G4733 Obstructive sleep apnea (adult) (pediatric): Secondary | ICD-10-CM

## 2018-05-24 MED ORDER — EZETIMIBE 10 MG PO TABS: 10.0000 mg | ORAL_TABLET | Freq: Every day | ORAL | 3 refills | Status: DC

## 2018-05-24 MED ORDER — AMLODIPINE BESYLATE 5 MG PO TABS: 7.5000 mg | ORAL_TABLET | Freq: Every day | ORAL | 3 refills | Status: DC

## 2018-05-24 MED ORDER — LOSARTAN POTASSIUM 100 MG PO TABS: 100.0000 mg | ORAL_TABLET | Freq: Every day | ORAL | 3 refills | Status: DC

## 2018-05-24 MED ORDER — FENOFIBRATE 145 MG PO TABS: 145.0000 mg | ORAL_TABLET | Freq: Every day | ORAL | 3 refills | Status: DC

## 2018-05-24 MED ORDER — CARVEDILOL 25 MG PO TABS: ORAL_TABLET | ORAL | 3 refills | Status: DC

## 2018-05-24 NOTE — Progress Notes (Signed)
Patient ID: William Orr, male   DOB: Feb 07, 1946, 72 y.o.   MRN: [id number]    Primary M.D.: Dr.A Lora Havens  HPI: William Orr is a 72 year old African American male who presents to the office for a 2 month follow-up cardiology/sleep evaluation.  William Orr  has a long history of hypertension,  type 2 diabetes mellitus, and hyperlipidemia. He  underwent an exercise nuclear study in [place] for atypical chest pain which revealed normal perfusion with an ejection fraction of 61%. There was no evidence for ischemia and he had normal wall motion. He was hypertensive throughout the study.  He has been maintained on blood pressure medicines consisting of diltiazem 300 mg daily, carvedilol 25 mg twice a day, and losartan HCT 100/25 mg. He previously, his had taken simvastatin 20 mg for hyperlipidemia  but self discontinued this.  Recently, he has been taking over-the-counter supplements with fair amount of omega-3 fatty acids for elevated triglycerides.[name] He takes Glucophage 850 mg in the morning and 2 tablets at night.   He has had some instances of atypical, probable musculoskeletal chest pain which improved with nonsteroidal therapy.  He underwent a 2-D echo Doppler study on 09/25/2013 and this revealed an ejection fraction of 60-65% with mild focal basal hypertrophy of the septum had normal diastolic parameters. There was moderate thickening of the trileaflet aortic valve consistent with aortic valve sclerosis. He had mild left atrial dilatation. His right ventricle was upper normal to mildly dilated.  Due to my concerns that he may have significant obstructive sleep apnea he underwent a diagnostic polysomnogram in March 2015 which revealed mild sleep apnea overall, with an AHI of 5.8, but he had moderate sleep apnea during REM sleep with an AHI of 25/hr.  He also had significant oxygen desaturation to 84% with non-REM sleep and 76% with REM sleep and had evidence for very loud snoring.  His  periodic limb movement index was 6.7 per hour with 9.5 per hour to arousal.  He underwent CPAP titration and 11 cm pressure was recommended.  He has a ResMed AirSense10 AutoSet.  A download from 03/11/2014 through 04/09/2014 revealed that he is meeting Medicare compliance with 100% days of the device usage.  97% of the time was used at greater than 4 hours.  With his current setting, AHI is excellent at 1.0.  His were index was 0.4.  He's using a fullface mask.  His DME company is [employer] in [place], [place].  Subjectively, since initiating therapy.  He feels markedly improved.  He denies daytime sleepiness.  He denies breakthrough snoring.  His sleep is more restorative.  He has more energy.  He denies hypersomnolence.  When I saw him in 72, he  continued to use CPAP but occasional night he would fall asleep before putting on his CPAP unit.  He does not sleep as well most nights and oftentimes wakes up several more times to go to the bathroom. Recently, he has noticed that he may experience some increased palpitations in the morning.  He has not been able to correlate if this is related to potential night sweats.   A download was obtained from 09/27/2015 through 12/25/2015.  This showed 77% of days of use with 69% of the days greater than 4 hours.  He was averaging 5 hours and 42 minutes of CPAP use.  His AHI at 11 cm water pressure remained excellent at 0.7.    A 2-D echo Doppler study revealed an EF of 60-65%.  There  was grade 2 diastolic dysfunction.  There was aortic valve sclerosis without stenosis or regurgitation.  There was suggestion that there was mildly reduced RV function.  Since I last saw him, he was admitted to [facility] with unstable angina and underwent catheterization on 01/02/2017 in the setting of a non-ST segment MI.  He was found to have multifocal high-grade disease with complex bifurcation stenosis of his proximal LAD and diagonal.  He underwent CABG revascularization surgery  by Dr. [name] on 01/04/2017 and had a LIMA placed to his distal LAD, vein graft to the first diagonal vessel, and vein graft to the second diagonal vessel.  He was seen in follow-up by Almyra Deforest, PAC.  He had significant hypertriglyceridemia and was started on lovaza and due to myalgias with Crestor statin therapy was changed to simvastatin, which he has tolerated.  He completed 8 weeks of cardiac rehabilitation with his last exercise this morning.  Typically his blood pressure at cardiac rehabilitation systolically is in the 408X to 130s.  He denies any recurrent chest pain or palpitations.  He continues to use CPAP with 100% compliance with [employer] as his DME company.  He's not had a recent download.    When I saw him in September 72.  His blood pressure was elevated on losartan 100 mg, diltiazem 3 mg, and carvedilol 25 mg twice a day.  He was taken [place] X3 omega-3 fatty acid, which included 1.125 g of EPA and to 75 mg of DHA.  Lipid studies were improved.  Triglycerides were still elevated at 340, down from 413.  Fenofibrate 145 to his medical regimen.  Over the past several months, he has felt well.  He's been without chest pain.  He uses CPAP with 100% compliance.  He denies chest pain, PND, orthopnea.  He saw Dr. Brigitte Pulse and his hemoglobin A1c was 6.0.  He is off statins due to statin intolerance.    I last saw him in 72.  His blood pressure was stable.  He was not having any anginal symptoms.  Is bradycardic and I reduced his low-dose. He was  continuing to use CPAP.  Over the past 6 months, he has remained stable.  He is in need to undergo endoscopy scheduled for April 24, 2018 and clearance is necessary to hold his aspirin and Plavix.  He denies any recurrent anginal symptoms.  He continues to use CPAP.  A download was obtained in April 19 through Dec 29, 2017.  DME company is [employer] in [place] and apparently we were unable to access his information beyond May 18.  However he still  admits to 100% compliance.  On that download he was continuing to meet compliance standards.  CPAP pressure is set at 11 cm of water.  AHI is excellent at 0.7.    I last saw him 4 months ago, I discussed improved sleep duration subsequently he has now been wearing CPAP all night.  Previously he used to fall asleep without it and then put it on in the middle of the night.  He now is using it almost 8 hours per night.  A new download was obtained from April 23, 2018 through May 22, 2018.  He is 100% compliant.  At a set pressure of 11's 11 cm, AHI is excellent at 0.4.  He does not have any leak.  He has more energy.  He is sleeping much better.  His previous 3+ nocturia has essentially vanished.  He will admits to some  fatigue and being tired.  Pulse is been in the low 50s.  He presents for evaluation.  Past Medical History:  Diagnosis Date  . Diabetes mellitus without complication ([place])   . History of hiatal hernia   . Hyperlipidemia   . Hypertension   . S/P CABG x 3 01/04/2017   LIMA to LAD, Sequential SVG to D1-D2, EVH via right thigh    Past Surgical History:  Procedure Laterality Date  . CORONARY ARTERY BYPASS GRAFT N/A 01/04/2017   Procedure: CORONARY ARTERY BYPASS GRAFTING (CABG)X3, ON PUMP, USING LEFT INTERNAL MAMMARY ARTERY AND RIGHT GREATER SAPHENOUS VEIN HARVESTED ENDOSCOPICALLY, LIMA-LAD, SEQ SVG- DIAG 1 -DIAG 2;  Surgeon: [name], MD;  Location: [facility];  Service: Open Heart Surgery;  Laterality: N/A;  . KNEE ARTHROSCOPY Bilateral   . LEFT HEART CATH AND CORONARY ANGIOGRAPHY N/A 01/02/2017   Procedure: Left Heart Cath and Coronary Angiography;  Surgeon: [name], MD;  Location: [facility];  Service: Cardiovascular;  Laterality: N/A;  . TEE WITHOUT CARDIOVERSION N/A 01/04/2017   Procedure: TRANSESOPHAGEAL ECHOCARDIOGRAM (TEE);  Surgeon: [name], MD;  Location: [facility];  Service: Open Heart Surgery;  Laterality: N/A;    Allergies  Allergen Reactions   . Lisinopril Cough    Current Outpatient Medications  Medication Sig Dispense Refill  . acetaminophen (TYLENOL) 650 MG CR tablet Take 650 mg by mouth every 8 (eight) hours as needed for pain.    [name] amLODipine (NORVASC) 5 MG tablet Take 1.5 tablets (7.5 mg total) by mouth daily. 135 tablet 3  . aspirin EC 81 MG EC tablet Take 1 tablet (81 mg total) by mouth daily.    . carvedilol (COREG) 25 MG tablet Take 1 tablet (25 mg total) by mouth every morning AND 0.5 tablets (12.5 mg total) at bedtime. 135 tablet 3  . cetirizine (ZYRTEC) 5 MG tablet Take 5 mg by mouth daily.    . Cholecalciferol (VITAMIN D-3) 5000 UNITS TABS Take 10,000 Units by mouth daily.     [name] ezetimibe (ZETIA) 10 MG tablet Take 1 tablet (10 mg total) by mouth daily. 90 tablet 3  . fenofibrate (TRICOR) 145 MG tablet Take 1 tablet (145 mg total) by mouth daily. 90 tablet 3  . fluticasone (FLONASE) 50 MCG/ACT nasal spray Place 1 spray into both nostrils daily as needed (seasonal allergies).     [name] linagliptin (TRADJENTA) 5 MG TABS tablet Take 5 mg by mouth daily.    [name] losartan (COZAAR) 100 MG tablet Take 1 tablet (100 mg total) by mouth daily. 90 tablet 3  . metFORMIN (GLUCOPHAGE) 850 MG tablet Take 850 mg by mouth 2 (two) times daily with a meal.     . Multiple Vitamin (MULTIVITAMIN WITH MINERALS) TABS tablet Take 2 tablets by mouth daily.    . Omega-3 Fatty Acids (FISH OIL PO) Take 2 capsules by mouth daily. 1550 mg    . OVER THE COUNTER MEDICATION Use as directed 1 capsule in the mouth or throat 3 (three) times daily. Total Restore    . OVER THE COUNTER MEDICATION Use as directed 1 capsule in the mouth or throat daily. Longevity Activator w/ Resveratrol    . pantoprazole (PROTONIX) 40 MG tablet Take 1 tablet by mouth daily.  6  . sodium chloride (OCEAN) 0.65 % SOLN nasal spray Place 1 spray into both nostrils 2 (two) times daily as needed for congestion.    . vitamin B-12 (CYANOCOBALAMIN) 1000 MCG tablet Take 1,000  mcg by mouth  daily.     No current facility-administered medications for this visit.     Social history is notable he is married for 46 years. He has 2 children and 3 grandchildren. 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 There is no history of tobacco use. There is no alcohol use.  Family History  Problem Relation Age of Onset  . Diabetes Mother   . Cancer - Prostate Father   . Rheum arthritis Sister   . Diabetes Brother   . Arthritis Brother   . Cancer - Other Brother        pancreatic  . Rheum arthritis Brother   . Diabetes type II Son    ROS General: Negative; No fevers, chills, or night sweats;  HEENT: Negative; No changes in vision or hearing, sinus congestion, difficulty swallowing Pulmonary: Negative; No cough, wheezing, shortness of breath, hemoptysis Cardiovascular: Occasional atypical chest pain; No presyncope, syncope, palpitations GI: Positive for hiatal hernia; No nausea, vomiting, diarrhea, or abdominal pain GU: Negative; No dysuria, hematuria, or difficulty voiding Musculoskeletal: Negative; no myalgias, joint pain, or weakness Hematologic/Oncology: Negative; no easy bruising, bleeding Endocrine: Positive for diabetes; no heat/cold intolerance; Neuro: Negative; no changes in balance, headaches Skin: Negative; No rashes or skin lesions Psychiatric: Negative; No behavioral problems, depression Sleep: since starting CPAP therapy no snoring, daytime sleepiness, hypersomnolence, bruxism, restless legs, hypnogognic hallucinations, no cataplexy Other comprehensive 14 point system review is negative.   PE BP 130/65   Pulse (!) 50   Ht '6\' 2"'$  (1.88 m)   Wt 246 lb 6.4 oz (111.8 kg)   BMI 31.64 kg/m    Blo repeat blood pressure by me was 130/70  Wt Readings from Last 3 Encounters:  05/24/18 246 lb 6.4 oz (111.8 kg)  04/11/18 246 lb (111.6 kg)  02/11/18 248 lb (112.5 kg)   General:  Alert, oriented, no distress.  Skin: normal turgor, no rashes, warm and dry HEENT: Normocephalic, atraumatic. Pupils equal round and reactive to light; sclera anicteric; extraocular muscles intact;  Nose without nasal septal hypertrophy Mouth/Parynx benign; Mallinpatti scale 3 Neck: No JVD, no carotid bruits; normal carotid upstroke Lungs: clear to ausculatation and percussion; no wheezing or rales Chest wall: without tenderness to palpitation Heart: PMI not displaced, RRR, s1 s2 normal, 1/6 systolic murmur, no diastolic murmur, no rubs, gallops, thrills, or heaves Abdomen: soft, nontender; no hepatosplenomehaly, BS+; abdominal aorta nontender and not dilated by palpation. Back: no CVA tenderness Pulses 2+ Musculoskeletal: full range of motion, normal strength, no joint deformities Extremities: no clubbing cyanosis or edema, Homan's sign negative  Neurologic: grossly nonfocal; Cranial nerves grossly wnl Psychologic: Normal mood and affect   ECG (independently read by me): Sinus bradycardia 50 bpm.  Borderline first-degree AV block with a PR interval of 206 ms.  Poor anterior R wave progression.  No ectopy.  April 11, 2018 ECG (independently read by me): Sinus bradycardia at 47 bpm.  First-degree AV block.  PR interval 228 ms.  May 2019 ECG (independently read by me): Sinus bradycardia 52 bpm with mild sinus arrhythmia.  First-degree AV block with a PR interval at 218 ms.  QS complex V1 V2.  September 2018 ECG (independently read by me): Sinus rhythm at 60 bpm with first-degree AV block.  Small Q wave in lead 3.  Nonspecific T changes.  PR interval 220 ms.  May 2018 ECG (independently read by  me): Sinus bradycardia 52 bpm.  Q wave in lead 3.  Normal intervals  January 2017 ECG (independently read by me):  Normal sinus rhythm at 62 with an isolated PVC.  QTC 410 ms.  First degree AV block with a PR interval 226.  Previous ECG from 09/15/13 (independently read by me): Sinus rhythm at 67  beats per minute with borderline first-degree block with PR interval 212 ms.   LABS:  BMP Latest Ref Rng & Units 01/09/2017 01/08/2017 01/07/2017  Glucose 65 - 99 mg/dL 136(H) 171(H) 125(H)  BUN 6 - 20 mg/dL '10 12 14  '$ Creatinine 0.61 - 1.24 mg/dL 0.78 0.81 0.87  BUN/Creat Ratio 10 - 22 - - -  Sodium 135 - 145 mmol/L 136 132(L) 131(L)  Potassium 3.5 - 5.1 mmol/L 3.6 3.9 3.9  Chloride 101 - 111 mmol/L 101 99(L) 100(L)  CO2 22 - 32 mmol/L '26 26 27  '$ Calcium 8.9 - 10.3 mg/dL 8.9 8.6(L) 8.3(L)   Hepatic Function Latest Ref Rng & Units 01/26/2017 01/03/2017 08/27/2015  Total Protein 6.0 - 8.5 g/dL 7.4 7.6 7.1  Albumin 3.6 - 4.8 g/dL 4.3 4.0 4.3  AST 0 - 40 IU/L 25 35 17  ALT 0 - 44 IU/L 32 39 16  Alk Phosphatase 39 - 117 IU/L 80 68 63  Total Bilirubin 0.0 - 1.2 mg/dL <0.2 0.7 0.3  Bilirubin, Direct 0.00 - 0.40 mg/dL 0.09 - -   CBC Latest Ref Rng & Units 01/09/2017 01/08/2017 01/07/2017  WBC 4.0 - 10.5 K/uL 5.7 7.4 10.1  Hemoglobin 13.0 - 17.0 g/dL 9.9(L) 9.3(L) 9.5(L)  Hematocrit 39.0 - 52.0 % 30.4(L) 29.0(L) 29.7(L)  Platelets 150 - 400 K/uL 268 212 173   Lab Results  Component Value Date   MCV 91.8 01/09/2017   MCV 92.4 01/08/2017   MCV 92.0 01/07/2017   Lab Results  Component Value Date   TSH 2.850 08/27/2015   Lab Results  Component Value Date   HGBA1C 7.2 (H) 01/03/2017   Lipid Panel     Component Value Date/Time   CHOL 133 09/10/2017 0944   TRIG 92 09/10/2017 0944   HDL 35 (L) 09/10/2017 0944   CHOLHDL 3.8 09/10/2017 0944   CHOLHDL 4.5 01/01/2017 0042   VLDL UNABLE TO CALCULATE IF TRIGLYCERIDE OVER 400 mg/dL 01/01/2017 0042   LDLCALC 80 09/10/2017 0944     RADIOLOGY: No results found.  IMPRESSION:  1. Coronary artery disease involving coronary bypass graft of native heart without angina pectoris   2. Hx of CABG   3. Essential hypertension   4. OSA on CPAP   5. Hyperlipidemia with target LDL less than 70   6. Controlled type 2 diabetes mellitus without  complication, without long-term current use of insulin (HCC)     ASSESSMENT AND PLAN: William Orr is a 72 year old African American male who has a history of long-standing  hypertension and diabetes mellitus. There is a family history for coronary artery disease with his mother dying from myocardial infarction and who also diabetes mellitus at age 76.  A prior nuclear study had revealed normal perfusion and  An echo Doppler study which revealed normal systolic function without regional wall motion abnormalities.  There was grade 2 diastolic dysfunction.  Abnormal tissue Doppler suggesting elevation of ventricular filling pressures.  He developed new onset unstable angina leading to urgent catheterization on 05/22/201 and  was found to have a complex bifurcation stenosis with a [place] 011 lesion.  He ultimately underwent CABG surgery with  LIMA to his LAD and vein grafts to 2 diagonal vessels.  Presently, he denies any recurrent anginal symptomatology since his CABG revascularization.  When I last saw him, he was significantly bradycardic and I reduced his diltiazem down to 240 mg.  Review of his medications indicate that he is on 2 calcium channel blockers with amlodipine 5 mg in addition to the diltiazem at 240 mg.  He also is on carvedilol 25 mg in the morning and 12.5 mg at night, losartan 100 mg daily.  His blood pressure today is improved from his last evaluation.  I suspect his continued fatigue is due to his bradycardia since his sleep is excellent on CPAP therapy with an AHI of only 0.4 hour and his sleep duration is now 7 hours and 39 minutes with CPAP use with 100% compliance.  His pulse today is 50.  I have recommended he discontinue diltiazem.  I will further titrate amlodipine to 7.5 mg for optimal blood pressure control but if he notices his blood pressure is in excess of 140 he will titrate this to 10 mg daily.  He is not having any recurrent anginal symptomatology.  He is now on Zetia 10 mg for  hyperlipidemia and has been statin intolerant.  His most recent LDL has improved and is now 80 but still above target.  He is diabetic on metformin and Tradjenta.  He is monitoring his blood pressures at home.  We will continue to adjust diet.  If LDL continues to increase he may be a candidate for Repatha.  I will see him in 4 months for reevaluation.  Time spent: 25 minutes  Troy Sine, MD, [facility] 05/24/2018 6:25 PM

## 2018-05-24 NOTE — Patient Instructions (Signed)
Medication Instructions:  STOP diltiazem INCREASE amlodipine to 7.5 mg (1.5 tablet) daily In 2-3 weeks if blood pressure is >140, increase amlodipine to 10 mg daily  If you need a refill on your cardiac medications before your next appointment, please call your pharmacy.   Lab work: Please return for FASTING labs at the [facility] (CMET, Lipid)  Our in office lab hours are Monday-Friday 8:00-4:00, closed for lunch 12:45-1:45 pm.  No appointment needed.  If you have labs (blood work) drawn today and your tests are completely normal, you will receive your results only by: [name] MyChart Message (if you have MyChart) OR . A paper copy in the mail If you have any lab test that is abnormal or we need to change your treatment, we will call you to review the results.  Follow-Up: At [facility], you and your health needs are our priority.  As part of our continuing mission to provide you with exceptional heart care, we have created designated Provider Care Teams.  These Care Teams include your primary Cardiologist (physician) and Advanced Practice Providers (APPs -  Physician Assistants and Nurse Practitioners) who all work together to provide you with the care you need, when you need it. You will need a follow up appointment in 4 months.  Please call our office 2 months in advance to schedule this appointment.  You may see [name] or one of the following Advanced Practice Providers on your designated Care Team: [place], [place] . Fabian Sharp, PA-C

## 2018-05-25 DIAGNOSIS — Z23 Encounter for immunization: Secondary | ICD-10-CM | POA: Diagnosis not present

## 2018-05-27 DIAGNOSIS — R1310 Dysphagia, unspecified: Secondary | ICD-10-CM | POA: Diagnosis not present

## 2018-05-27 DIAGNOSIS — Z1211 Encounter for screening for malignant neoplasm of colon: Secondary | ICD-10-CM | POA: Diagnosis not present

## 2018-05-27 DIAGNOSIS — K449 Diaphragmatic hernia without obstruction or gangrene: Secondary | ICD-10-CM | POA: Diagnosis not present

## 2018-05-27 DIAGNOSIS — K219 Gastro-esophageal reflux disease without esophagitis: Secondary | ICD-10-CM | POA: Diagnosis not present

## 2018-05-27 NOTE — Addendum Note (Signed)
Addended by: Crissie Reese on: 05/27/2018 03:55 PM   Modules accepted: Orders

## 2018-06-05 DIAGNOSIS — I10 Essential (primary) hypertension: Secondary | ICD-10-CM | POA: Diagnosis not present

## 2018-06-05 DIAGNOSIS — E119 Type 2 diabetes mellitus without complications: Secondary | ICD-10-CM | POA: Diagnosis not present

## 2018-06-05 DIAGNOSIS — E7800 Pure hypercholesterolemia, unspecified: Secondary | ICD-10-CM | POA: Diagnosis not present

## 2018-06-14 DIAGNOSIS — H9313 Tinnitus, bilateral: Secondary | ICD-10-CM | POA: Diagnosis not present

## 2018-06-14 DIAGNOSIS — R42 Dizziness and giddiness: Secondary | ICD-10-CM | POA: Diagnosis not present

## 2018-06-14 DIAGNOSIS — H903 Sensorineural hearing loss, bilateral: Secondary | ICD-10-CM | POA: Diagnosis not present

## 2018-06-20 DIAGNOSIS — E1140 Type 2 diabetes mellitus with diabetic neuropathy, unspecified: Secondary | ICD-10-CM | POA: Diagnosis not present

## 2018-06-20 DIAGNOSIS — L110 Acquired keratosis follicularis: Secondary | ICD-10-CM | POA: Diagnosis not present

## 2018-06-20 DIAGNOSIS — L609 Nail disorder, unspecified: Secondary | ICD-10-CM | POA: Diagnosis not present

## 2018-06-21 DIAGNOSIS — R42 Dizziness and giddiness: Secondary | ICD-10-CM | POA: Diagnosis not present

## 2018-06-21 DIAGNOSIS — H903 Sensorineural hearing loss, bilateral: Secondary | ICD-10-CM | POA: Diagnosis not present

## 2018-06-21 DIAGNOSIS — J302 Other seasonal allergic rhinitis: Secondary | ICD-10-CM | POA: Diagnosis not present

## 2018-06-25 DIAGNOSIS — M170 Bilateral primary osteoarthritis of knee: Secondary | ICD-10-CM | POA: Diagnosis not present

## 2018-06-28 DIAGNOSIS — E7800 Pure hypercholesterolemia, unspecified: Secondary | ICD-10-CM | POA: Diagnosis not present

## 2018-06-28 DIAGNOSIS — E1165 Type 2 diabetes mellitus with hyperglycemia: Secondary | ICD-10-CM | POA: Diagnosis not present

## 2018-06-28 DIAGNOSIS — Z299 Encounter for prophylactic measures, unspecified: Secondary | ICD-10-CM | POA: Diagnosis not present

## 2018-06-28 DIAGNOSIS — T7840XA Allergy, unspecified, initial encounter: Secondary | ICD-10-CM | POA: Diagnosis not present

## 2018-06-28 DIAGNOSIS — Z6831 Body mass index (BMI) 31.0-31.9, adult: Secondary | ICD-10-CM | POA: Diagnosis not present

## 2018-06-28 DIAGNOSIS — I10 Essential (primary) hypertension: Secondary | ICD-10-CM | POA: Diagnosis not present

## 2018-07-05 DIAGNOSIS — E119 Type 2 diabetes mellitus without complications: Secondary | ICD-10-CM | POA: Diagnosis not present

## 2018-07-05 DIAGNOSIS — E7800 Pure hypercholesterolemia, unspecified: Secondary | ICD-10-CM | POA: Diagnosis not present

## 2018-07-05 DIAGNOSIS — I10 Essential (primary) hypertension: Secondary | ICD-10-CM | POA: Diagnosis not present

## 2018-07-09 DIAGNOSIS — K449 Diaphragmatic hernia without obstruction or gangrene: Secondary | ICD-10-CM | POA: Diagnosis not present

## 2018-07-09 DIAGNOSIS — K219 Gastro-esophageal reflux disease without esophagitis: Secondary | ICD-10-CM | POA: Diagnosis not present

## 2018-07-09 DIAGNOSIS — Z1211 Encounter for screening for malignant neoplasm of colon: Secondary | ICD-10-CM | POA: Diagnosis not present

## 2018-07-12 DIAGNOSIS — Z79899 Other long term (current) drug therapy: Secondary | ICD-10-CM | POA: Diagnosis not present

## 2018-07-12 DIAGNOSIS — E782 Mixed hyperlipidemia: Secondary | ICD-10-CM | POA: Diagnosis not present

## 2018-07-13 LAB — COMPREHENSIVE METABOLIC PANEL
A/G RATIO: 1.9 (ref 1.2–2.2)
ALT: 12 IU/L (ref 0–44)
AST: 15 IU/L (ref 0–40)
Albumin: 4.5 g/dL (ref 3.5–4.8)
Alkaline Phosphatase: 47 IU/L (ref 39–117)
BUN/Creatinine Ratio: 18 (ref 10–24)
BUN: 21 mg/dL (ref 8–27)
Bilirubin Total: 0.4 mg/dL (ref 0.0–1.2)
CHLORIDE: 99 mmol/L (ref 96–106)
CO2: 24 mmol/L (ref 20–29)
Calcium: 10 mg/dL (ref 8.6–10.2)
Creatinine, Ser: 1.19 mg/dL (ref 0.76–1.27)
GFR calc Af Amer: 71 mL/min/{1.73_m2} (ref >59)
GFR, EST NON AFRICAN AMERICAN: 61 mL/min/{1.73_m2} (ref >59)
Globulin, Total: 2.4 g/dL (ref 1.5–4.5)
Glucose: 132 mg/dL — ABNORMAL HIGH (ref 65–99)
POTASSIUM: 4.7 mmol/L (ref 3.5–5.2)
Sodium: 137 mmol/L (ref 134–144)
Total Protein: 6.9 g/dL (ref 6.0–8.5)

## 2018-07-13 LAB — LIPID PANEL
CHOL/HDL RATIO: 3 ratio (ref 0.0–5.0)
Cholesterol, Total: 111 mg/dL (ref 100–199)
HDL: 37 mg/dL — ABNORMAL LOW (ref >39)
LDL CALC: 51 mg/dL (ref 0–99)
TRIGLYCERIDES: 113 mg/dL (ref 0–149)
VLDL Cholesterol Cal: 23 mg/dL (ref 5–40)

## 2018-07-24 DIAGNOSIS — E7800 Pure hypercholesterolemia, unspecified: Secondary | ICD-10-CM | POA: Diagnosis not present

## 2018-07-24 DIAGNOSIS — I10 Essential (primary) hypertension: Secondary | ICD-10-CM | POA: Diagnosis not present

## 2018-07-24 DIAGNOSIS — E119 Type 2 diabetes mellitus without complications: Secondary | ICD-10-CM | POA: Diagnosis not present

## 2018-08-03 ENCOUNTER — Encounter (HOSPITAL_COMMUNITY): Payer: Self-pay | Admitting: Emergency Medicine

## 2018-08-03 ENCOUNTER — Other Ambulatory Visit: Payer: Self-pay

## 2018-08-03 ENCOUNTER — Emergency Department (HOSPITAL_COMMUNITY)
Admission: EM | Admit: 2018-08-03 | Discharge: 2018-08-03 | Disposition: A | Discharge status: Home / Self Care | Payer: Medicare Other | Attending: Emergency Medicine | Admitting: Emergency Medicine

## 2018-08-03 ENCOUNTER — Emergency Department (HOSPITAL_COMMUNITY): Payer: Medicare Other

## 2018-08-03 DIAGNOSIS — I2510 Atherosclerotic heart disease of native coronary artery without angina pectoris: Secondary | ICD-10-CM | POA: Insufficient documentation

## 2018-08-03 DIAGNOSIS — Z87891 Personal history of nicotine dependence: Secondary | ICD-10-CM | POA: Diagnosis not present

## 2018-08-03 DIAGNOSIS — I10 Essential (primary) hypertension: Secondary | ICD-10-CM | POA: Diagnosis not present

## 2018-08-03 DIAGNOSIS — Z79899 Other long term (current) drug therapy: Secondary | ICD-10-CM | POA: Diagnosis not present

## 2018-08-03 DIAGNOSIS — Z7982 Long term (current) use of aspirin: Secondary | ICD-10-CM | POA: Insufficient documentation

## 2018-08-03 DIAGNOSIS — R079 Chest pain, unspecified: Secondary | ICD-10-CM | POA: Diagnosis not present

## 2018-08-03 DIAGNOSIS — E119 Type 2 diabetes mellitus without complications: Secondary | ICD-10-CM | POA: Diagnosis not present

## 2018-08-03 DIAGNOSIS — Z7984 Long term (current) use of oral hypoglycemic drugs: Secondary | ICD-10-CM | POA: Diagnosis not present

## 2018-08-03 DIAGNOSIS — R0789 Other chest pain: Secondary | ICD-10-CM | POA: Diagnosis not present

## 2018-08-03 LAB — CBC
HCT: 41 % (ref 39.0–52.0)
Hemoglobin: 12.8 g/dL — ABNORMAL LOW (ref 13.0–17.0)
MCH: 29.5 pg (ref 26.0–34.0)
MCHC: 31.2 g/dL (ref 30.0–36.0)
MCV: 94.5 fL (ref 80.0–100.0)
Platelets: 241 10*3/uL (ref 150–400)
RBC: 4.34 MIL/uL (ref 4.22–5.81)
RDW: 13.2 % (ref 11.5–15.5)
WBC: 4.9 10*3/uL (ref 4.0–10.5)
nRBC: 0 % (ref 0.0–0.2)

## 2018-08-03 LAB — CBG MONITORING, ED: Glucose-Capillary: 108 mg/dL — ABNORMAL HIGH (ref 70–99)

## 2018-08-03 LAB — I-STAT TROPONIN, ED: TROPONIN I, POC: 0 ng/mL (ref 0.00–0.08)

## 2018-08-03 LAB — BASIC METABOLIC PANEL
Anion gap: 7 (ref 5–15)
BUN: 17 mg/dL (ref 8–23)
CALCIUM: 9.5 mg/dL (ref 8.9–10.3)
CO2: 26 mmol/L (ref 22–32)
Chloride: 104 mmol/L (ref 98–111)
Creatinine, Ser: 1.09 mg/dL (ref 0.61–1.24)
GFR calc Af Amer: >60 mL/min (ref >60)
Glucose, Bld: 93 mg/dL (ref 70–99)
Potassium: 4 mmol/L (ref 3.5–5.1)
Sodium: 137 mmol/L (ref 135–145)

## 2018-08-03 LAB — TROPONIN I: Troponin I: <0.03 ng/mL (ref <0.03)

## 2018-08-03 MED ORDER — ISOSORBIDE MONONITRATE ER 30 MG PO TB24
30.0000 mg | ORAL_TABLET | Freq: Every day | ORAL | Status: DC
  Administered 2018-08-03: 30 mg via ORAL
  Filled 2018-08-03 (×3): qty 1

## 2018-08-03 MED ORDER — ISOSORBIDE MONONITRATE ER 30 MG PO TB24: 30.0000 mg | ORAL_TABLET | Freq: Every day | ORAL | 1 refills | Status: DC

## 2018-08-03 MED ORDER — ACETAMINOPHEN 500 MG PO TABS
1000.0000 mg | ORAL_TABLET | Freq: Once | ORAL | Status: AC
  Administered 2018-08-03: 1000 mg via ORAL
  Filled 2018-08-03: qty 2

## 2018-08-03 NOTE — ED Provider Notes (Signed)
[facility] EMERGENCY DEPARTMENT Provider Note   CSN: [id number] Arrival date & time: 08/03/18  1550     History   Chief Complaint Chief Complaint  Patient presents with  . Chest Pain    HPI William Orr is a 72 y.o. male.  Chest pain described as a pressure sensation since yesterday morning without diaphoresis.  He is slightly short of breath with exertion.  Past medical history CABG in May 2018.  Has had PVCs in the past.  Severity of symptoms is mild to moderate.     Past Medical History:  Diagnosis Date  . Diabetes mellitus without complication ([place])   . History of hiatal hernia   . Hyperlipidemia   . Hypertension   . S/P CABG x 3 01/04/2017   LIMA to LAD, Sequential SVG to D1-D2, EVH via right thigh    Patient Active Problem List   Diagnosis Date Noted  . S/P CABG x 3 01/04/2017  . Coronary artery disease involving native coronary artery of native heart with unstable angina pectoris ([place])   . Pure hypercholesterolemia   . Chest pain 12/31/2016  . Evaluate for OSA (obstructive sleep apnea) 10/19/2013  . Hiatal hernia 10/19/2013  . Atypical chest pain 09/15/2013  . DM2 (diabetes mellitus, type 2) ([place]) 09/15/2013  . HTN (hypertension) 09/15/2013  . Hyperlipidemia with target LDL less than 70 09/15/2013    Past Surgical History:  Procedure Laterality Date  . CORONARY ARTERY BYPASS GRAFT N/A 01/04/2017   Procedure: CORONARY ARTERY BYPASS GRAFTING (CABG)X3, ON PUMP, USING LEFT INTERNAL MAMMARY ARTERY AND RIGHT GREATER SAPHENOUS VEIN HARVESTED ENDOSCOPICALLY, LIMA-LAD, SEQ SVG- DIAG 1 -DIAG 2;  Surgeon: [name], MD;  Location: [facility];  Service: Open Heart Surgery;  Laterality: N/A;  . KNEE ARTHROSCOPY Bilateral   . LEFT HEART CATH AND CORONARY ANGIOGRAPHY N/A 01/02/2017   Procedure: Left Heart Cath and Coronary Angiography;  Surgeon: [name], MD;  Location: [facility];  Service: Cardiovascular;  Laterality: N/A;  . TEE WITHOUT  CARDIOVERSION N/A 01/04/2017   Procedure: TRANSESOPHAGEAL ECHOCARDIOGRAM (TEE);  Surgeon: [name], MD;  Location: [facility];  Service: Open Heart Surgery;  Laterality: N/A;        Home Medications    Prior to Admission medications   Medication Sig Start Date End Date Taking? Authorizing Provider  acetaminophen (TYLENOL) 650 MG CR tablet Take 650 mg by mouth every 8 (eight) hours as needed for pain.   Yes [provider]  Alogliptin Benzoate 25 MG TABS Take 1 tablet by mouth every morning.   Yes [provider]  amLODipine (NORVASC) 5 MG tablet Take 1.5 tablets (7.5 mg total) by mouth daily. Patient taking differently: Take 10 mg by mouth at bedtime.  05/24/18  Yes Troy Sine, MD  aspirin EC 81 MG EC tablet Take 1 tablet (81 mg total) by mouth daily. 01/10/17  Yes Barrett, Erin R, PA-C  carvedilol (COREG) 25 MG tablet Take 1 tablet (25 mg total) by mouth every morning AND 0.5 tablets (12.5 mg total) at bedtime. Patient taking differently: Take one tablet by mouth twice daily 05/24/18  Yes Troy Sine, MD  cetirizine (ZYRTEC) 10 MG tablet Take 10 mg by mouth daily as needed for allergies.    Yes [provider]  Cholecalciferol (VITAMIN D-3) 5000 UNITS TABS Take 10,000 Units by mouth every morning.    Yes [provider]  ezetimibe (ZETIA) 10 MG tablet Take 1 tablet (10 mg  total) by mouth daily. Patient taking differently: Take 10 mg by mouth every evening.  05/24/18 08/22/18 Yes Troy Sine, MD  fenofibrate (TRICOR) 145 MG tablet Take 1 tablet (145 mg total) by mouth daily. Patient taking differently: Take 145 mg by mouth every evening.  05/24/18  Yes Troy Sine, MD  fluticasone (FLONASE) 50 MCG/ACT nasal spray Place 1 spray into both nostrils daily as needed (seasonal allergies).  08/11/13  Yes [provider]  losartan (COZAAR) 100 MG tablet Take 1 tablet (100 mg total) by mouth daily. Patient taking differently: Take 100 mg  by mouth every morning.  05/24/18  Yes Troy Sine, MD  magnesium oxide (MAG-OX) 400 MG tablet Take 400 mg by mouth every morning.   Yes [provider]  metFORMIN (GLUCOPHAGE) 850 MG tablet Take 850 mg by mouth 2 (two) times daily with a meal.  08/19/13  Yes [provider]  Multiple Vitamin (MULTIVITAMIN WITH MINERALS) TABS tablet Take 2 tablets by mouth every morning.    Yes [provider]  NON FORMULARY Take 2 capsules by mouth daily. Amazin Grape: MUscadine Grapeseed/Skin   Yes [provider]  Omega-3 Fatty Acids (FISH OIL PO) Take 2 capsules by mouth every morning. OMAX3- Ultra Pure 1550 mg   Yes [provider]  OVER THE COUNTER MEDICATION Take 4-5 capsules by mouth every morning. Prostate Defense   Yes [provider]  pantoprazole (PROTONIX) 40 MG tablet Take 1 tablet by mouth every morning.  04/09/18  Yes [provider]  isosorbide mononitrate (IMDUR) 30 MG 24 hr tablet Take 1 tablet (30 mg total) by mouth daily. 08/03/18   Nat Christen, MD  sodium chloride (OCEAN) 0.65 % SOLN nasal spray Place 1 spray into both nostrils 2 (two) times daily as needed for congestion.    [provider]    Family History Family History  Problem Relation Age of Onset  . Diabetes Mother   . Cancer - Prostate Father   . Rheum arthritis Sister   . Diabetes Brother   . Arthritis Brother   . Cancer - Other Brother        pancreatic  . Rheum arthritis Brother   . Diabetes type II Son     Social History Social History   Tobacco Use  . Smoking status: Former Smoker    Types: Pipe  . Smokeless tobacco: Never Used  . Tobacco comment: quit smoking pipe 30 years ago.  Substance Use Topics  . Alcohol use: No  . Drug use: No     Allergies   Jardiance [empagliflozin]; Lisinopril; and Statins   Review of Systems Review of Systems  All other systems reviewed and are negative.    Physical Exam Updated Vital Signs BP  (!) 120/45   Pulse (!) 55   Resp 18   Ht 6\' 2"  (1.88 m)   Wt 111.1 kg   SpO2 98%   BMI 31.46 kg/m   Physical Exam Vitals signs and nursing note reviewed.  Constitutional:      Appearance: He is well-developed.  HENT:     Head: Normocephalic and atraumatic.  Eyes:     Conjunctiva/sclera: Conjunctivae normal.  Neck:     Musculoskeletal: Neck supple.  Cardiovascular:     Rate and Rhythm: Normal rate and regular rhythm.     Comments: PVCs Pulmonary:     Effort: Pulmonary effort is normal.     Breath sounds: Normal breath sounds.  Abdominal:  General: Bowel sounds are normal.     Palpations: Abdomen is soft.  Musculoskeletal: Normal range of motion.  Skin:    General: Skin is warm and dry.  Neurological:     Mental Status: He is alert and oriented to person, place, and time.  Psychiatric:        Behavior: Behavior normal.      ED Treatments / Results  Labs (all labs ordered are listed, but only abnormal results are displayed) Labs Reviewed  CBC - Abnormal; Notable for the following components:      Result Value   Hemoglobin 12.8 (*)    All other components within normal limits  CBG MONITORING, ED - Abnormal; Notable for the following components:   Glucose-Capillary 108 (*)    All other components within normal limits  BASIC METABOLIC PANEL  TROPONIN I  I-STAT TROPONIN, ED    EKG EKG Interpretation  Date/Time:  Saturday August 03 2018 15:57:46 EST Ventricular Rate:  59 PR Interval:    QRS Duration: 110 QT Interval:  401 QTC Calculation: 398 R Axis:   47 Text Interpretation:  Sinus rhythm Ventricular trigeminy Borderline prolonged PR interval Low voltage, precordial leads Confirmed by Nat Christen [phone]) on 08/03/2018 5:29:24 PM   Radiology Dg Chest 2 View  Result Date: 08/03/2018 CLINICAL DATA:  Chest pressure today, history coronary disease post bypass, hypertension EXAM: CHEST - 2 VIEW COMPARISON:  None FINDINGS: Normal heart size post CABG.  Mediastinal contours and pulmonary vascularity normal. Lungs clear. No pulmonary infiltrate, pleural effusion or pneumothorax. Bones unremarkable. IMPRESSION: Post CABG. No acute abnormalities. Electronically Signed   By: Lavonia Dana M.D.   On: 08/03/2018 16:52    Procedures Procedures (including critical care time)  Medications Ordered in ED Medications  isosorbide mononitrate (IMDUR) 24 hr tablet 30 mg (has no administration in time range)  acetaminophen (TYLENOL) tablet 1,000 mg (1,000 mg Oral Given 08/03/18 1925)     Initial Impression / Assessment and Plan / ED Course  I have reviewed the triage vital signs and the nursing notes.  Pertinent labs & imaging results that were available during my care of the patient were reviewed by me and considered in my medical decision making (see chart for details).     Patient with known coronary artery disease status post CABG last year presents with chest pressure.  EKG shows no ST segment changes, but does exhibit PVCs.  Delta troponin negative.  Disc with cardiologist on-call at [facility].  Will start Imdur 30 mg daily.  He will follow-up with his cardiologist next week.  Final Clinical Impressions(s) / ED Diagnoses   Final diagnoses:  Chest pain, unspecified type    ED Discharge Orders         Ordered    isosorbide mononitrate (IMDUR) 30 MG 24 hr tablet  Daily     08/03/18 1949           Nat Christen, MD 08/03/18 2037

## 2018-08-03 NOTE — Discharge Instructions (Signed)
Troponin was measured x2 and normal.  Discussed with cardiologist in [place].  Recommended Imdur.  Call Dr. Evette Georges office for a follow-up next week.

## 2018-08-03 NOTE — ED Triage Notes (Signed)
Patient has a history if a CABG in May of 2018. He present with chest pressure today and has taken a baby aspirin prior to coming.

## 2018-08-03 NOTE — Progress Notes (Addendum)
Cardiology [place] Note  Contacted by ED provider regarding patient, who presented to ED with several days of intermittent chest pressure. See ED provider note regarding further details on presentation. ECG's without ischemic changes but with occasional PVC's. Troponin negative x 1. Other labs normal. Recommended increasing patient's carvedilol to 25mg  BID and possibly adding Imdur 30mg  daily. As long as patient remains without significant chest pain in ED and rules out for ACS, plan to have patient discharge home and have him follow up with his outpatient cardiologist, [name] (cc'd on this note). Notably, patient's next appt with cardiology is scheduled for 2/11. [name] to determine if appointment should be moved to sooner.   Marcie Mowers, MD Cardiology Fellow, PGY-6

## 2018-08-05 ENCOUNTER — Telehealth: Payer: Self-pay | Admitting: Cardiovascular Disease

## 2018-08-05 NOTE — Telephone Encounter (Signed)
Pt states he was recently seen in [facility] ED for PVC's and instructed to to follow up in 1 week with Cardiologist. Appointment made for 08/12/18 at 1130 with Jory Sims, [place].

## 2018-08-05 NOTE — Telephone Encounter (Signed)
  Patient went to the ER for PVC's and was told by ER Dr to follow up with Dr Claiborne Billings to see if he would like to see William Orr earlier than his appt in February. Please let him know.

## 2018-08-10 NOTE — Progress Notes (Signed)
Cardiology Office Note   Date:  08/10/2018   ID:  William Orr, DOB 22-Mar-1946, MRN [id number]  PCP:  [name], MD  Cardiologist: [name] No chief complaint on file.    History of Present Illness: William Orr is a 72 y.o. male who presents for ongoing assessment and management of coronary artery disease with coronary artery bypass grafting LIMA to LAD and vein graft to 2 diagonal  12/15/2017.  He also has a history of hypertension, hyperlipidemia, type 2 diabetes, and OSA followed by [name] as well.    On last office visit with [name] on 05/24/2018 the patient was found to be on 2 calcium channel blockers to include amlodipine and diltiazem, diltiazem was discontinued and amlodipine was titrated up to 7.5 mg for optimal blood pressure control.  He is statin intolerant and remain on Zetia 10 mg for hyperlipidemia.  He was to have repeat lipids and LFTs this office visit, if the LDL remains elevated greater than 80, the patient would be referred to pharmacy for [place].  He was seen in the emergency room on 08/05/2018 for complaints of palpitations and chest pain.  EKG revealed sinus rhythm with ventricular trigeminy, borderline prolonged PR interval, heart rate of 59 bpm.  The patient was started on Imdur 30 mg daily and is here for post ED follow-up. He states that the palpitations got better when he started taking the Imdur but he had a bad headache and stopped taking it.  The palpitations have come back but not as severe.  He has been compliant with all other medications and with CPAP.   Past Medical History:  Diagnosis Date  . Diabetes mellitus without complication ([place])   . History of hiatal hernia   . Hyperlipidemia   . Hypertension   . S/P CABG x 3 01/04/2017   LIMA to LAD, Sequential SVG to D1-D2, EVH via right thigh    Past Surgical History:  Procedure Laterality Date  . CORONARY ARTERY BYPASS GRAFT N/A 01/04/2017   Procedure: CORONARY ARTERY BYPASS GRAFTING  (CABG)X3, ON PUMP, USING LEFT INTERNAL MAMMARY ARTERY AND RIGHT GREATER SAPHENOUS VEIN HARVESTED ENDOSCOPICALLY, LIMA-LAD, SEQ SVG- DIAG 1 -DIAG 2;  Surgeon: [name], MD;  Location: [facility];  Service: Open Heart Surgery;  Laterality: N/A;  . KNEE ARTHROSCOPY Bilateral   . LEFT HEART CATH AND CORONARY ANGIOGRAPHY N/A 01/02/2017   Procedure: Left Heart Cath and Coronary Angiography;  Surgeon: [name], MD;  Location: [place];  Service: Cardiovascular;  Laterality: N/A;  . TEE WITHOUT CARDIOVERSION N/A 01/04/2017   Procedure: TRANSESOPHAGEAL ECHOCARDIOGRAM (TEE);  Surgeon: [name], MD;  Location: [facility];  Service: Open Heart Surgery;  Laterality: N/A;     Current Outpatient Medications  Medication Sig Dispense Refill  . acetaminophen (TYLENOL) 650 MG CR tablet Take 650 mg by mouth every 8 (eight) hours as needed for pain.    . Alogliptin Benzoate 25 MG TABS Take 1 tablet by mouth every morning.    [name] amLODipine (NORVASC) 5 MG tablet Take 1.5 tablets (7.5 mg total) by mouth daily. (Patient taking differently: Take 10 mg by mouth at bedtime. ) 135 tablet 3  . aspirin EC 81 MG EC tablet Take 1 tablet (81 mg total) by mouth daily.    . carvedilol (COREG) 25 MG tablet Take 1 tablet (25 mg total) by mouth every morning AND 0.5 tablets (12.5 mg total) at bedtime. (Patient taking differently: Take one tablet by mouth  twice daily) 135 tablet 3  . cetirizine (ZYRTEC) 10 MG tablet Take 10 mg by mouth daily as needed for allergies.     . Cholecalciferol (VITAMIN D-3) 5000 UNITS TABS Take 10,000 Units by mouth every morning.     . ezetimibe (ZETIA) 10 MG tablet Take 1 tablet (10 mg total) by mouth daily. (Patient taking differently: Take 10 mg by mouth every evening. ) 90 tablet 3  . fenofibrate (TRICOR) 145 MG tablet Take 1 tablet (145 mg total) by mouth daily. (Patient taking differently: Take 145 mg by mouth every evening. ) 90 tablet 3  . fluticasone (FLONASE) 50 MCG/ACT nasal  spray Place 1 spray into both nostrils daily as needed (seasonal allergies).     . isosorbide mononitrate (IMDUR) 30 MG 24 hr tablet Take 1 tablet (30 mg total) by mouth daily. 30 tablet 1  . losartan (COZAAR) 100 MG tablet Take 1 tablet (100 mg total) by mouth daily. (Patient taking differently: Take 100 mg by mouth every morning. ) 90 tablet 3  . magnesium oxide (MAG-OX) 400 MG tablet Take 400 mg by mouth every morning.    . metFORMIN (GLUCOPHAGE) 850 MG tablet Take 850 mg by mouth 2 (two) times daily with a meal.     . Multiple Vitamin (MULTIVITAMIN WITH MINERALS) TABS tablet Take 2 tablets by mouth every morning.     . NON FORMULARY Take 2 capsules by mouth daily. Amazin Grape: MUscadine Grapeseed/Skin    . Omega-3 Fatty Acids (FISH OIL PO) Take 2 capsules by mouth every morning. OMAX3- Ultra Pure 1550 mg    . OVER THE COUNTER MEDICATION Take 4-5 capsules by mouth every morning. Prostate Defense    . pantoprazole (PROTONIX) 40 MG tablet Take 1 tablet by mouth every morning.   6  . sodium chloride (OCEAN) 0.65 % SOLN nasal spray Place 1 spray into both nostrils 2 (two) times daily as needed for congestion.     No current facility-administered medications for this visit.     Allergies:   Jardiance [empagliflozin]; Lisinopril; and Statins    Social History:  The patient  reports that he has quit smoking. His smoking use included pipe. He has never used smokeless tobacco. He reports that he does not drink alcohol or use drugs.   Family History:  The patient's family history includes Arthritis in his brother; Cancer - Other in his brother; Cancer - Prostate in his father; Diabetes in his brother and mother; Diabetes type II in his son; Rheum arthritis in his brother and sister.    ROS: All other systems are reviewed and negative. Unless otherwise mentioned in H&P    PHYSICAL EXAM: VS:  There were no vitals taken for this visit. , BMI There is no height or weight on file to calculate  BMI. GEN: Well nourished, well developed, in no acute distress HEENT: normal Neck: no JVD, carotid bruits, or masses Cardiac: IRRR; occasional extra systole, no murmurs, rubs, or gallops,no edema  Respiratory:  Clear to auscultation bilaterally, normal work of breathing GI: soft, nontender, nondistended, + BS MS: no deformity or atrophy Skin: warm and dry, no rash Neuro:  Strength and sensation are intact Psych: euthymic mood, full affect   EKG: Not completed this office visit. I did review the EKD on 08/05/2018 with revealed sinus rhythm with PVCs in trigeminy.   Recent Labs: 07/12/2018: ALT 12 08/03/2018: BUN 17; Creatinine, Ser 1.09; Hemoglobin 12.8; Platelets 241; Potassium 4.0; Sodium 137    Lipid Panel  Component Value Date/Time   CHOL 111 07/12/2018 0821   TRIG 113 07/12/2018 0821   HDL 37 (L) 07/12/2018 0821   CHOLHDL 3.0 07/12/2018 0821   CHOLHDL 4.5 01/01/2017 0042   VLDL UNABLE TO CALCULATE IF TRIGLYCERIDE OVER 400 mg/dL 01/01/2017 0042   LDLCALC 51 07/12/2018 0821      Wt Readings from Last 3 Encounters:  08/03/18 245 lb (111.1 kg)  05/24/18 246 lb 6.4 oz (111.8 kg)  04/11/18 246 lb (111.6 kg)      Other studies Reviewed: Cardiac Cath 01/02/2017 Conclusion    Unstable angina pectoris/non-ST elevation MI  Multifocal high-grade disease involving the proximal and mid LAD (tandem lesions). The LAD gives origin to 2 large diagonals, the first of which contains ostial to proximal 80-85% stenosis and forms a [place] 011 bifurcation lesion with the proximal LAD. The second larger diagonal contains 50-70% mid stenosis.  Circumflex is widely patent without significant stenosis.  Right coronary is dominant with a large PDA which contains luminal irregularities but no high-grade obstruction.  Normal left ventricular function. Left ventricular end-diastolic pressure is upper normal.  RECOMMENDATION:   CABG with LIMA to LAD and diagonals versus higher risk  multilesion PCI. Have notified [name] (his primary cardiologist) and also asked for TCTS consult.     ASSESSMENT AND PLAN:  1. Hypertension: BP was elevated initially, but I rechecked it in the office and it was found to be 132/70.  He will be started back on Imdur 15 mg daily from 30 mg daily. He notices that his BP does go up with exertion. He will take Tylenol in the beginning to assist him with tolerance of nitrates.   2. Palpitations: As discussed he felt better on the nitrates. He continues to feel palpitations almost daily. I will place a Zio monitor for 2 weeks. May consider stopping amlodipine and starting him back on diltiazem. Will await his arrhythmic burden evaluation.   3. CAD: Hx of CABG with LIMA to LAD, VG to two diagonals 12/2017. Will not plan any ischemic testing at this time.  Add the nitrates.   4. Hypercholesterolemia: Will check fasting lipids and LFTs for ongoing surveillance Continue Zetia. May need to be started on PCSK9 inhibitor is necessary.    Current medicines are reviewed at length with the patient today.    Labs/ tests ordered today include: Lipids and LFT's   Phill Myron. [place] Pugh, ANP, AACC   08/10/2018 12:40 PM    [address] Office [phone] Fax [phone]

## 2018-08-12 ENCOUNTER — Ambulatory Visit (INDEPENDENT_AMBULATORY_CARE_PROVIDER_SITE_OTHER): Payer: Medicare Other | Admitting: Adult Health

## 2018-08-12 ENCOUNTER — Encounter: Payer: Self-pay | Admitting: Adult Health

## 2018-08-12 VITALS — BP 160/70 | HR 77 | Ht 74.0 in | Wt 243.0 lb

## 2018-08-12 DIAGNOSIS — R002 Palpitations: Secondary | ICD-10-CM | POA: Diagnosis not present

## 2018-08-12 DIAGNOSIS — Z79899 Other long term (current) drug therapy: Secondary | ICD-10-CM | POA: Diagnosis not present

## 2018-08-12 DIAGNOSIS — I25810 Atherosclerosis of coronary artery bypass graft(s) without angina pectoris: Secondary | ICD-10-CM

## 2018-08-12 DIAGNOSIS — R000 Tachycardia, unspecified: Secondary | ICD-10-CM

## 2018-08-12 DIAGNOSIS — I10 Essential (primary) hypertension: Secondary | ICD-10-CM | POA: Diagnosis not present

## 2018-08-12 DIAGNOSIS — I2510 Atherosclerotic heart disease of native coronary artery without angina pectoris: Secondary | ICD-10-CM

## 2018-08-12 MED ORDER — ISOSORBIDE MONONITRATE ER 30 MG PO TB24: 15.0000 mg | ORAL_TABLET | Freq: Every day | ORAL | 1 refills | Status: DC

## 2018-08-12 NOTE — Patient Instructions (Signed)
Medication Instructions:  Start isosorbide 15mg  daily If you need a refill on your cardiac medications before your next appointment, please call your pharmacy.  Labwork: LIPID AND LFT COME-IN TOMORROW FASTING If you have labs (blood work) drawn today and your tests are completely normal, you will receive your results only by MyChart Message (if you have MyChart) -OR- A paper copy in the mail.  If you have any lab test that is abnormal or we need to change your treatment, we will call you to review these results.  Testing/Procedures: Your physician has recommended that you wear a 14 DAY ZIO-PATCH monitor. The Zio patch cardiac monitor continuously records heart rhythm data for up to 14 days, this is for patients being evaluated for multiple types heart rhythms. For the first 24 hours post application, please avoid getting the Zio monitor wet in the shower or by excessive sweating during exercise. After that, feel free to carry on with regular activities. Keep soaps and lotions away from the ZIO XT Patch.  This will be placed at our [facility] location - [address].    Follow-Up: You will need a follow up appointment [place].  Please call our office 2 months in advance to schedule this appointment.  You may see Shelva Majestic, MD or one of the following Advanced Practice Providers on your designated Care Team:  Almyra Deforest, PA-C   Fabian Sharp, PA-C  At [facility], you and your health needs are our priority.  As part of our continuing mission to provide you with exceptional heart care, we have created designated Provider Care Teams.  These Care Teams include your primary Cardiologist (physician) and Advanced Practice Providers (APPs -  Physician Assistants and Nurse Practitioners) who all work together to provide you with the care you need, when you need it.

## 2018-08-13 ENCOUNTER — Other Ambulatory Visit: Payer: Self-pay | Admitting: Adult Health

## 2018-08-13 DIAGNOSIS — Z79899 Other long term (current) drug therapy: Secondary | ICD-10-CM | POA: Diagnosis not present

## 2018-08-14 LAB — LIPID PANEL
Chol/HDL Ratio: 3.9 ratio (ref 0.0–5.0)
Cholesterol, Total: 106 mg/dL (ref 100–199)
HDL: 27 mg/dL — ABNORMAL LOW (ref >39)
LDL Calculated: 29 mg/dL (ref 0–99)
Triglycerides: 252 mg/dL — ABNORMAL HIGH (ref 0–149)
VLDL CHOLESTEROL CAL: 50 mg/dL — AB (ref 5–40)

## 2018-08-14 LAB — HEPATIC FUNCTION PANEL
ALT: 13 IU/L (ref 0–44)
AST: 15 IU/L (ref 0–40)
Albumin: 4.2 g/dL (ref 3.5–4.8)
Alkaline Phosphatase: 61 IU/L (ref 39–117)
Bilirubin Total: <0.2 mg/dL (ref 0.0–1.2)
Bilirubin, Direct: 0.12 mg/dL (ref 0.00–0.40)
Total Protein: 6.6 g/dL (ref 6.0–8.5)

## 2018-08-19 ENCOUNTER — Ambulatory Visit (INDEPENDENT_AMBULATORY_CARE_PROVIDER_SITE_OTHER): Payer: Medicare Other

## 2018-08-19 DIAGNOSIS — R002 Palpitations: Secondary | ICD-10-CM

## 2018-08-19 DIAGNOSIS — Z79899 Other long term (current) drug therapy: Secondary | ICD-10-CM | POA: Diagnosis not present

## 2018-08-19 DIAGNOSIS — R000 Tachycardia, unspecified: Secondary | ICD-10-CM

## 2018-08-22 DIAGNOSIS — I10 Essential (primary) hypertension: Secondary | ICD-10-CM | POA: Diagnosis not present

## 2018-08-22 DIAGNOSIS — E7800 Pure hypercholesterolemia, unspecified: Secondary | ICD-10-CM | POA: Diagnosis not present

## 2018-08-22 DIAGNOSIS — E119 Type 2 diabetes mellitus without complications: Secondary | ICD-10-CM | POA: Diagnosis not present

## 2018-09-03 DIAGNOSIS — Z6831 Body mass index (BMI) 31.0-31.9, adult: Secondary | ICD-10-CM | POA: Diagnosis not present

## 2018-09-03 DIAGNOSIS — J069 Acute upper respiratory infection, unspecified: Secondary | ICD-10-CM | POA: Diagnosis not present

## 2018-09-03 DIAGNOSIS — Z299 Encounter for prophylactic measures, unspecified: Secondary | ICD-10-CM | POA: Diagnosis not present

## 2018-09-03 DIAGNOSIS — Z789 Other specified health status: Secondary | ICD-10-CM | POA: Diagnosis not present

## 2018-09-03 DIAGNOSIS — I10 Essential (primary) hypertension: Secondary | ICD-10-CM | POA: Diagnosis not present

## 2018-09-06 DIAGNOSIS — S338XXA Sprain of other parts of lumbar spine and pelvis, initial encounter: Secondary | ICD-10-CM | POA: Diagnosis not present

## 2018-09-06 DIAGNOSIS — S233XXA Sprain of ligaments of thoracic spine, initial encounter: Secondary | ICD-10-CM | POA: Diagnosis not present

## 2018-09-06 DIAGNOSIS — S134XXA Sprain of ligaments of cervical spine, initial encounter: Secondary | ICD-10-CM | POA: Diagnosis not present

## 2018-09-06 DIAGNOSIS — M9901 Segmental and somatic dysfunction of cervical region: Secondary | ICD-10-CM | POA: Diagnosis not present

## 2018-09-06 DIAGNOSIS — M9902 Segmental and somatic dysfunction of thoracic region: Secondary | ICD-10-CM | POA: Diagnosis not present

## 2018-09-06 DIAGNOSIS — M9903 Segmental and somatic dysfunction of lumbar region: Secondary | ICD-10-CM | POA: Diagnosis not present

## 2018-09-07 DIAGNOSIS — R002 Palpitations: Secondary | ICD-10-CM | POA: Diagnosis not present

## 2018-09-09 DIAGNOSIS — M9902 Segmental and somatic dysfunction of thoracic region: Secondary | ICD-10-CM | POA: Diagnosis not present

## 2018-09-09 DIAGNOSIS — S134XXA Sprain of ligaments of cervical spine, initial encounter: Secondary | ICD-10-CM | POA: Diagnosis not present

## 2018-09-09 DIAGNOSIS — S233XXA Sprain of ligaments of thoracic spine, initial encounter: Secondary | ICD-10-CM | POA: Diagnosis not present

## 2018-09-09 DIAGNOSIS — M9903 Segmental and somatic dysfunction of lumbar region: Secondary | ICD-10-CM | POA: Diagnosis not present

## 2018-09-09 DIAGNOSIS — M9901 Segmental and somatic dysfunction of cervical region: Secondary | ICD-10-CM | POA: Diagnosis not present

## 2018-09-09 DIAGNOSIS — S338XXA Sprain of other parts of lumbar spine and pelvis, initial encounter: Secondary | ICD-10-CM | POA: Diagnosis not present

## 2018-09-16 DIAGNOSIS — M9901 Segmental and somatic dysfunction of cervical region: Secondary | ICD-10-CM | POA: Diagnosis not present

## 2018-09-16 DIAGNOSIS — S134XXA Sprain of ligaments of cervical spine, initial encounter: Secondary | ICD-10-CM | POA: Diagnosis not present

## 2018-09-16 DIAGNOSIS — M9902 Segmental and somatic dysfunction of thoracic region: Secondary | ICD-10-CM | POA: Diagnosis not present

## 2018-09-16 DIAGNOSIS — S338XXA Sprain of other parts of lumbar spine and pelvis, initial encounter: Secondary | ICD-10-CM | POA: Diagnosis not present

## 2018-09-16 DIAGNOSIS — S233XXA Sprain of ligaments of thoracic spine, initial encounter: Secondary | ICD-10-CM | POA: Diagnosis not present

## 2018-09-16 DIAGNOSIS — M9903 Segmental and somatic dysfunction of lumbar region: Secondary | ICD-10-CM | POA: Diagnosis not present

## 2018-09-18 DIAGNOSIS — L609 Nail disorder, unspecified: Secondary | ICD-10-CM | POA: Diagnosis not present

## 2018-09-18 DIAGNOSIS — L110 Acquired keratosis follicularis: Secondary | ICD-10-CM | POA: Diagnosis not present

## 2018-09-18 DIAGNOSIS — E1140 Type 2 diabetes mellitus with diabetic neuropathy, unspecified: Secondary | ICD-10-CM | POA: Diagnosis not present

## 2018-09-19 DIAGNOSIS — S338XXA Sprain of other parts of lumbar spine and pelvis, initial encounter: Secondary | ICD-10-CM | POA: Diagnosis not present

## 2018-09-19 DIAGNOSIS — S233XXA Sprain of ligaments of thoracic spine, initial encounter: Secondary | ICD-10-CM | POA: Diagnosis not present

## 2018-09-19 DIAGNOSIS — S134XXA Sprain of ligaments of cervical spine, initial encounter: Secondary | ICD-10-CM | POA: Diagnosis not present

## 2018-09-19 DIAGNOSIS — M9901 Segmental and somatic dysfunction of cervical region: Secondary | ICD-10-CM | POA: Diagnosis not present

## 2018-09-19 DIAGNOSIS — M9902 Segmental and somatic dysfunction of thoracic region: Secondary | ICD-10-CM | POA: Diagnosis not present

## 2018-09-19 DIAGNOSIS — M9903 Segmental and somatic dysfunction of lumbar region: Secondary | ICD-10-CM | POA: Diagnosis not present

## 2018-09-24 ENCOUNTER — Encounter: Payer: Self-pay | Admitting: Cardiovascular Disease

## 2018-09-24 ENCOUNTER — Ambulatory Visit (INDEPENDENT_AMBULATORY_CARE_PROVIDER_SITE_OTHER): Payer: Medicare Other | Admitting: Cardiovascular Disease

## 2018-09-24 VITALS — BP 130/64 | HR 66 | Ht 74.0 in | Wt 245.0 lb

## 2018-09-24 DIAGNOSIS — G4733 Obstructive sleep apnea (adult) (pediatric): Secondary | ICD-10-CM

## 2018-09-24 DIAGNOSIS — S233XXA Sprain of ligaments of thoracic spine, initial encounter: Secondary | ICD-10-CM | POA: Diagnosis not present

## 2018-09-24 DIAGNOSIS — Z951 Presence of aortocoronary bypass graft: Secondary | ICD-10-CM

## 2018-09-24 DIAGNOSIS — I493 Ventricular premature depolarization: Secondary | ICD-10-CM | POA: Diagnosis not present

## 2018-09-24 DIAGNOSIS — M9901 Segmental and somatic dysfunction of cervical region: Secondary | ICD-10-CM | POA: Diagnosis not present

## 2018-09-24 DIAGNOSIS — I10 Essential (primary) hypertension: Secondary | ICD-10-CM

## 2018-09-24 DIAGNOSIS — E118 Type 2 diabetes mellitus with unspecified complications: Secondary | ICD-10-CM | POA: Diagnosis not present

## 2018-09-24 DIAGNOSIS — Z9989 Dependence on other enabling machines and devices: Secondary | ICD-10-CM

## 2018-09-24 DIAGNOSIS — S134XXA Sprain of ligaments of cervical spine, initial encounter: Secondary | ICD-10-CM | POA: Diagnosis not present

## 2018-09-24 DIAGNOSIS — S338XXA Sprain of other parts of lumbar spine and pelvis, initial encounter: Secondary | ICD-10-CM | POA: Diagnosis not present

## 2018-09-24 DIAGNOSIS — E785 Hyperlipidemia, unspecified: Secondary | ICD-10-CM | POA: Diagnosis not present

## 2018-09-24 DIAGNOSIS — M9903 Segmental and somatic dysfunction of lumbar region: Secondary | ICD-10-CM | POA: Diagnosis not present

## 2018-09-24 DIAGNOSIS — I2510 Atherosclerotic heart disease of native coronary artery without angina pectoris: Secondary | ICD-10-CM | POA: Diagnosis not present

## 2018-09-24 DIAGNOSIS — M9902 Segmental and somatic dysfunction of thoracic region: Secondary | ICD-10-CM | POA: Diagnosis not present

## 2018-09-24 MED ORDER — CARVEDILOL 25 MG PO TABS: ORAL_TABLET | ORAL | 3 refills | Status: DC

## 2018-09-24 MED ORDER — EZETIMIBE 10 MG PO TABS: 10.0000 mg | ORAL_TABLET | Freq: Every day | ORAL | 3 refills | Status: DC

## 2018-09-24 MED ORDER — ICOSAPENT ETHYL 1 G PO CAPS: 2.0000 | ORAL_CAPSULE | Freq: Two times a day (BID) | ORAL | 2 refills | Status: DC

## 2018-09-24 NOTE — Progress Notes (Signed)
Patient ID: William Orr, male   DOB: 03-13-1946, 73 y.o.   MRN: [id number]    Primary M.D.: Dr.A Lora Havens  HPI: William Orr is a 73 year old African American male who presents to the office for a 2 month follow-up cardiology/sleep evaluation.  [name]  has a long history of hypertension,  type 2 diabetes mellitus, and hyperlipidemia. He  underwent an exercise nuclear study in [place] for atypical chest pain which revealed normal perfusion with an ejection fraction of 61%. There was no evidence for ischemia and he had normal wall motion. He was hypertensive throughout the study.  He has been maintained on blood pressure medicines consisting of diltiazem 300 mg daily, carvedilol 25 mg twice a day, and losartan HCT 100/25 mg. He previously, his had taken simvastatin 20 mg for hyperlipidemia  but self discontinued this.  Recently, he has been taking over-the-counter supplements with fair amount of omega-3 fatty acids for elevated triglycerides.[name] He takes Glucophage 850 mg in the morning and 2 tablets at night.   He has had some instances of atypical, probable musculoskeletal chest pain which improved with nonsteroidal therapy.  He underwent a 2-D echo Doppler study on 09/25/2013 and this revealed an ejection fraction of 60-65% with mild focal basal hypertrophy of the septum had normal diastolic parameters. There was moderate thickening of the trileaflet aortic valve consistent with aortic valve sclerosis. He had mild left atrial dilatation. His right ventricle was upper normal to mildly dilated.    Due to my concerns that he may have significant obstructive sleep apnea he underwent a diagnostic polysomnogram in March 2015 which revealed mild sleep apnea overall, with an AHI of 5.8, but he had moderate sleep apnea during REM sleep with an AHI of 25/hr.  He also had significant oxygen desaturation to 84% with non-REM sleep and 76% with REM sleep and had evidence for very loud snoring.  His  periodic limb movement index was 6.7 per hour with 9.5 per hour to arousal.  He underwent CPAP titration and 11 cm pressure was recommended.  He has a ResMed AirSense10 AutoSet.  A download from 03/11/2014 through 04/09/2014 revealed that he is meeting Medicare compliance with 100% days of the device usage.  97% of the time was used at greater than 4 hours.  With his current setting, AHI is excellent at 1.0.  His were index was 0.4.  He's using a fullface mask.  His DME company is [employer] in [place], [place].  Subjectively, since initiating therapy.  He feels markedly improved.  He denies daytime sleepiness.  He denies breakthrough snoring.  His sleep is more restorative.  He has more energy.  He denies hypersomnolence.  When I saw him in 2017, he  continued to use CPAP but occasional night he would fall asleep before putting on his CPAP unit.  He does not sleep as well most nights and oftentimes wakes up several more times to go to the bathroom. Recently, he has noticed that he may experience some increased palpitations in the morning.  He has not been able to correlate if this is related to potential night sweats.   A download was obtained from 09/27/2015 through 12/25/2015.  This showed 77% of days of use with 69% of the days greater than 4 hours.  He was averaging 5 hours and 42 minutes of CPAP use.  His AHI at 11 cm water pressure remained excellent at 0.7.    A 2-D echo Doppler study revealed an EF of 60-65%.  There was grade 2 diastolic dysfunction.  There was aortic valve sclerosis without stenosis or regurgitation.  There was suggestion that there was mildly reduced RV function.  Since I last saw him, he was admitted to [facility] with unstable angina and underwent catheterization on 01/02/2017 in the setting of a non-ST segment MI.  He was found to have multifocal high-grade disease with complex bifurcation stenosis of his proximal LAD and diagonal.  He underwent CABG revascularization surgery  by Dr. [name] on 01/04/2017 and had a LIMA placed to his distal LAD, vein graft to the first diagonal vessel, and vein graft to the second diagonal vessel.  He was seen in follow-up by Almyra Deforest, PAC.  He had significant hypertriglyceridemia and was started on lovaza and due to myalgias with Crestor statin therapy was changed to simvastatin, which he has tolerated.  He completed 8 weeks of cardiac rehabilitation with his last exercise this morning.  Typically his blood pressure at cardiac rehabilitation systolically is in the 270B to 130s.  He denies any recurrent chest pain or palpitations.  He continues to use CPAP with 100% compliance with [employer] as his DME company.  He's not had a recent download.    When I saw him in September 2018.  His blood pressure was elevated on losartan 100 mg, diltiazem 3 mg, and carvedilol 25 mg twice a day.  He was taken [place] X3 omega-3 fatty acid, which included 1.125 g of EPA and to 75 mg of DHA.  Lipid studies were improved.  Triglycerides were still elevated at 340, down from 413.  Fenofibrate 145 to his medical regimen.  Over the past several months, he has felt well.  He's been without chest pain.  He uses CPAP with 100% compliance.  He denies chest pain, PND, orthopnea.  He saw Dr. Brigitte Pulse and his hemoglobin A1c was 6.0.  He is off statins due to statin intolerance.    I last saw him in 2019.  His blood pressure was stable.  He was not having any anginal symptoms.  Is bradycardic and I reduced his low-dose. He was  continuing to use CPAP.  Over the past 6 months, he has remained stable.  He is in need to undergo endoscopy scheduled for April 24, 2018 and clearance is necessary to hold his aspirin and Plavix.  He denies any recurrent anginal symptoms.  He continues to use CPAP.  A download was obtained in April 19 through Dec 29, 2017.  DME company is [employer] in [place] and apparently we were unable to access his information beyond May 18.  However he still  admits to 100% compliance.  On that download he was continuing to meet compliance standards.  CPAP pressure is set at 11 cm of water.  AHI is excellent at 0.7.    When I saw him in June 2019 I discussed improved sleep duration subsequently he has now been wearing CPAP all night.  Previously he used to fall asleep without it and then put it on in the middle of the night.  I last saw him in October 2019.  At that time he had improved sleep duration and was he now is using CPAP almost 8 hours per night.  A new download was obtained from April 23, 2018 through May 22, 2018.  He is 100% compliant.  At a set pressure of 11's 11 cm, AHI is excellent at 0.4.  He does not have any leak.  He has more energy.  He is sleeping much better.  His previous 3+ nocturia has essentially vanished.  He will admits to some fatigue and being tired.  Pulse is been in the low 50s.  During that evaluation, I recommended discontinuance of diltiazem in light of his bradycardia and he is now been on amlodipine which was titrated to 7.5 mg.  This was subsequently titrated further to 10 mg.  He had seen Jory Sims in late December 2019 following an emergency room evaluation of August 03, 2018.  During his ER evaluation he had complaints of palpitations with vague chest pain.  His ECG showed sinus rhythm with ventricular trigeminy and prolonged PR interval.  He was started on Imdur but stopped taking the Imdur secondary to development of significant headache.  He underwent a Zoll monitor for 2 weeks.  This showed predominant sinus rhythm with average heart rate 67 bpm.  There was one episode of supraventricular tachycardia which lasted 4 beats at a rate of 139.  He had isolated PACs.  There were rare atrial couplets.  He had isolated PVCs which were frequent with rare episodes of ventricular couplet and one ventricular triplet but had some episodes of ventricular bigeminy and trigeminy.  Presently, he feels improved.  He denies  any lightheadedness.  He continues to use CPAP therapy.  His primary physician is Dr. Berlinda Last.  He presents for evaluation.  Past Medical History:  Diagnosis Date  . Diabetes mellitus without complication ([place])   . History of hiatal hernia   . Hyperlipidemia   . Hypertension   . S/P CABG x 3 01/04/2017   LIMA to LAD, Sequential SVG to D1-D2, EVH via right thigh    Past Surgical History:  Procedure Laterality Date  . CORONARY ARTERY BYPASS GRAFT N/A 01/04/2017   Procedure: CORONARY ARTERY BYPASS GRAFTING (CABG)X3, ON PUMP, USING LEFT INTERNAL MAMMARY ARTERY AND RIGHT GREATER SAPHENOUS VEIN HARVESTED ENDOSCOPICALLY, LIMA-LAD, SEQ SVG- DIAG 1 -DIAG 2;  Surgeon: [name], MD;  Location: [facility];  Service: Open Heart Surgery;  Laterality: N/A;  . KNEE ARTHROSCOPY Bilateral   . LEFT HEART CATH AND CORONARY ANGIOGRAPHY N/A 01/02/2017   Procedure: Left Heart Cath and Coronary Angiography;  Surgeon: [name], MD;  Location: [facility];  Service: Cardiovascular;  Laterality: N/A;  . TEE WITHOUT CARDIOVERSION N/A 01/04/2017   Procedure: TRANSESOPHAGEAL ECHOCARDIOGRAM (TEE);  Surgeon: [name], MD;  Location: [facility];  Service: Open Heart Surgery;  Laterality: N/A;    Allergies  Allergen Reactions  . Jardiance [Empagliflozin] Shortness Of Breath, Rash and Cough  . Lisinopril Cough  . Statins     Muscle pain     Current Outpatient Medications  Medication Sig Dispense Refill  . acetaminophen (TYLENOL) 650 MG CR tablet Take 650 mg by mouth every 8 (eight) hours as needed for pain.    . Alogliptin Benzoate 25 MG TABS Take 1 tablet by mouth every morning.    [name] amLODipine (NORVASC) 10 MG tablet Take 10 mg by mouth daily.    [name] aspirin EC 81 MG EC tablet Take 1 tablet (81 mg total) by mouth daily.    . carvedilol (COREG) 25 MG tablet Take 1.5 tablet (37.5) in the AM, and 1 tablet (25 mg) in the PM 135 tablet 3  . cetirizine (ZYRTEC) 10 MG tablet Take 10 mg by mouth daily as  needed for allergies.     . Cholecalciferol (VITAMIN D-3) 5000 UNITS TABS Take 10,000 Units by mouth  every morning.     . fenofibrate (TRICOR) 145 MG tablet Take 1 tablet (145 mg total) by mouth daily. (Patient taking differently: Take 145 mg by mouth every evening. ) 90 tablet 3  . fluticasone (FLONASE) 50 MCG/ACT nasal spray Place 1 spray into both nostrils daily as needed (seasonal allergies).     . Hyaluronic Acid-Vitamin C (HYALURONIC ACID PO) Take 3 capsules by mouth daily.    . Inulin (FIBER CHOICE) 1.5 g CHEW Chew by mouth.    . losartan (COZAAR) 100 MG tablet Take 1 tablet (100 mg total) by mouth daily. (Patient taking differently: Take 100 mg by mouth every morning. ) 90 tablet 3  . magnesium oxide (MAG-OX) 400 MG tablet Take 400 mg by mouth every morning.    . metFORMIN (GLUCOPHAGE) 850 MG tablet Take 850 mg by mouth 2 (two) times daily with a meal.     . Misc Natural Products (TURMERIC CURCUMIN) CAPS Take by mouth 2 (two) times daily.    . Multiple Vitamin (MULTIVITAMIN WITH MINERALS) TABS tablet Take 2 tablets by mouth every morning.     . NON FORMULARY Take 2 capsules by mouth daily. Amazin Grape: MUscadine Grapeseed/Skin    . OVER THE COUNTER MEDICATION daily. Blood boost    . pantoprazole (PROTONIX) 40 MG tablet Take 1 tablet by mouth every morning.   6  . ezetimibe (ZETIA) 10 MG tablet Take 1 tablet (10 mg total) by mouth daily. 90 tablet 3  . Icosapent Ethyl (VASCEPA) 1 g CAPS Take 2 capsules (2 g total) by mouth 2 (two) times daily. 360 capsule 2   No current facility-administered medications for this visit.     Social history is notable he is married for 46 years. He has 2 children and 3 grandchildren. 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 There is no history of tobacco use. There is no alcohol use.  Family History  Problem Relation Age of Onset  . Diabetes Mother   . Cancer -  Prostate Father   . Rheum arthritis Sister   . Diabetes Brother   . Arthritis Brother   . Cancer - Other Brother        pancreatic  . Rheum arthritis Brother   . Diabetes type II Son    ROS General: Negative; No fevers, chills, or night sweats;  HEENT: Negative; No changes in vision or hearing, sinus congestion, difficulty swallowing Pulmonary: Negative; No cough, wheezing, shortness of breath, hemoptysis Cardiovascular: Occasional atypical chest pain; No presyncope, syncope, palpitations GI: Positive for hiatal hernia; No nausea, vomiting, diarrhea, or abdominal pain GU: Negative; No dysuria, hematuria, or difficulty voiding Musculoskeletal: Negative; no myalgias, joint pain, or weakness Hematologic/Oncology: Negative; no easy bruising, bleeding Endocrine: Positive for diabetes; no heat/cold intolerance; Neuro: Negative; no changes in balance, headaches Skin: Negative; No rashes or skin lesions Psychiatric: Negative; No behavioral problems, depression Sleep: since starting CPAP therapy no snoring, daytime sleepiness, hypersomnolence, bruxism, restless legs, hypnogognic hallucinations, no cataplexy Other comprehensive 14 point system review is negative.   PE BP 130/64   Pulse 66   Ht _0  (1.88 m)   Wt 245 lb (111.1 kg)   SpO2 96%   BMI 31.46 kg/m    Repeat blood pressure by me was 136/74  Wt Readings from Last 3 Encounters:  09/24/18 245 lb (111.1 kg)  08/12/18 243 lb (110.2 kg)  08/03/18 245 lb (  111.1 kg)   General: Alert, oriented, no distress.  Skin: normal turgor, no rashes, warm and dry HEENT: Normocephalic, atraumatic. Pupils equal round and reactive to light; sclera anicteric; extraocular muscles intact;  Nose without nasal septal hypertrophy Mouth/Parynx benign; Mallinpatti scale3 Neck: No JVD, no carotid bruits; normal carotid upstroke Lungs: clear to ausculatation and percussion; no wheezing or rales Chest wall: without tenderness to palpitation Heart:  PMI not displaced, RRR, s1 s2 normal, 1/6 systolic murmur, no diastolic murmur, no rubs, gallops, thrills, or heaves Abdomen: soft, nontender; no hepatosplenomehaly, BS+; abdominal aorta nontender and not dilated by palpation. Back: no CVA tenderness Pulses 2+ Musculoskeletal: full range of motion, normal strength, no joint deformities Extremities: no clubbing cyanosis or edema, Homan's sign negative  Neurologic: grossly nonfocal; Cranial nerves grossly wnl Psychologic: Normal mood and affect   ECG (independently read by me): Sinus rhythm at 66 bpm.  Q wave in lead III and aVF.  Peroneal 194 ms.  QTc interval 400 ms.  May 24, 2018 ECG (independently read by me): Sinus bradycardia 50 bpm.  Borderline first-degree AV block with a PR interval of 206 ms.  Poor anterior R wave progression.  No ectopy.  April 11, 2018 ECG (independently read by me): Sinus bradycardia at 47 bpm.  First-degree AV block.  PR interval 228 ms.  May 2019 ECG (independently read by me): Sinus bradycardia 52 bpm with mild sinus arrhythmia.  First-degree AV block with a PR interval at 218 ms.  QS complex V1 V2.  September 2018 ECG (independently read by me): Sinus rhythm at 60 bpm with first-degree AV block.  Small Q wave in lead 3.  Nonspecific T changes.  PR interval 220 ms.  May 2018 ECG (independently read by me): Sinus bradycardia 52 bpm.  Q wave in lead 3.  Normal intervals  January 2017 ECG (independently read by me):  Normal sinus rhythm at 62 with an isolated PVC.  QTC 410 ms.  First degree AV block with a PR interval 226.  Previous ECG from 09/15/13 (independently read by me): Sinus rhythm at 67 beats per minute with borderline first-degree block with PR interval 212 ms.   LABS:  BMP Latest Ref Rng & Units 08/03/2018 07/12/2018 01/09/2017  Glucose 70 - 99 mg/dL 93 132(H) 136(H)  BUN 8 - 23 mg/dL _0 Creatinine 0.61 - 1.24 mg/dL 1.09 1.19 0.78  BUN/Creat Ratio 10 - 24 - 18 -  Sodium 135 - 145  mmol/L 137 137 136  Potassium 3.5 - 5.1 mmol/L 4.0 4.7 3.6  Chloride 98 - 111 mmol/L 104 99 101  CO2 22 - 32 mmol/L _1 Calcium 8.9 - 10.3 mg/dL 9.5 10.0 8.9   Hepatic Function Latest Ref Rng & Units 08/13/2018 07/12/2018 01/26/2017  Total Protein 6.0 - 8.5 g/dL 6.6 6.9 7.4  Albumin 3.5 - 4.8 g/dL 4.2 4.5 4.3  AST 0 - 40 IU/L _2 ALT 0 - 44 IU/L 13 12 32  Alk Phosphatase 39 - 117 IU/L 61 47 80  Total Bilirubin 0.0 - 1.2 mg/dL <0.2 0.4 <0.2  Bilirubin, Direct 0.00 - 0.40 mg/dL 0.12 - 0.09   CBC Latest Ref Rng & Units 08/03/2018 01/09/2017 01/08/2017  WBC 4.0 - 10.5 K/uL 4.9 5.7 7.4  Hemoglobin 13.0 - 17.0 g/dL 12.8(L) 9.9(L) 9.3(L)  Hematocrit 39.0 - 52.0 % 41.0 30.4(L) 29.0(L)  Platelets 150 - 400 K/uL 241 268 212   Lab Results  Component Value Date  MCV 94.5 08/03/2018   MCV 91.8 01/09/2017   MCV 92.4 01/08/2017   Lab Results  Component Value Date   TSH 2.850 08/27/2015   Lab Results  Component Value Date   HGBA1C 7.2 (H) 01/03/2017   Lipid Panel     Component Value Date/Time   CHOL 106 08/13/2018 1112   TRIG 252 (H) 08/13/2018 1112   HDL 27 (L) 08/13/2018 1112   CHOLHDL 3.9 08/13/2018 1112   CHOLHDL 4.5 01/01/2017 0042   VLDL UNABLE TO CALCULATE IF TRIGLYCERIDE OVER 400 mg/dL 01/01/2017 0042   LDLCALC 29 08/13/2018 1112     RADIOLOGY: No results found.  IMPRESSION:  1. Essential hypertension   2. Coronary artery disease involving native coronary artery of native heart without angina pectoris   3. Hx of CABG   4. OSA on CPAP   5. Hyperlipidemia with target LDL less than 70   6. Type 2 diabetes mellitus with complication, without long-term current use of insulin (HCC)   7. PVC's (premature ventricular contractions)     ASSESSMENT AND PLAN: Mr. Ryland is a 73 year old African American male who has a history of long-standing  hypertension and diabetes mellitus. There is a family history for coronary artery disease with his mother dying from  myocardial infarction and who also diabetes mellitus at age 21.  A prior nuclear study had revealed normal perfusion and  An echo Doppler study  revealed normal systolic function without regional wall motion abnormalities.  There was grade 2 diastolic dysfunction.  Abnormal tissue Doppler suggesting elevation of ventricular filling pressures.  He developed new onset unstable angina leading to urgent catheterization on 01/02/2017 and  was found to have a complex bifurcation stenosis with a [place] 011 lesion.  He ultimately underwent CABG surgery with LIMA to his LAD and vein grafts to 2 diagonal vessels.  Presently, he denies any recurrent anginal symptomatology since his CABG revascularization.  He subsequently developed bradycardia  and I reduced his diltiazem down to 240 mg.  When he was last seen apparently he inadvertently was taking 2 calcium channel blockers and I discontinued diltiazem.  Subsequently, his amlodipine dose was titrated to 7.5 mg and later to 10 mg for more optimal blood pressure benefit.  He had recently been evaluated in the emergency room with palpitations.  I reviewed his ZIO monitor with him in detail which she had worn from January 6 through September 02, 2018.  He was noted to have frequent PVCs with a very rare couplet and one triplet and had one episode of 4 beats of SVT but otherwise heart rate was averaging at 67.  His blood pressure today on repeat by me was 136/74.  With his ectopy I have suggested slight additional titration of carvedilol to 37.5 mg in the morning and 25 mg at night.  He is not having any anginal symptoms.  I did not feel he needed to stay on the isosorbide which was started in the emergency room in which he ultimately discontinued due to nitrate headache.  I obtained a download of his CPAP today from August 26, 2018 through September 24, 2018.  Compliance is excellent and he is averaging over 7 hours of use per night.  At a set pressure of 11 cm AHI is excellent at  0.3.  There is no significant mask leak.  I reviewed his most recent lipid studies which are markedly improved and most recent cholesterol was 106, HDL 27 VLDL 50 and LDL 29.  Triglycerides were  252.  I have recommended the addition ofVascepa and I discussed with him the reduce it trial.  He will undergo repeat laboratory in  6 months and I will see him in the office in 6 months for reevaluation.   Time spent: 25 minutes  Troy Sine, MD, [facility] 09/25/2018 7:24 PM

## 2018-09-24 NOTE — Patient Instructions (Addendum)
Medication Instructions:  Stop over the counter fish oil. Start Vascepa 2 capsules twice daily. Take Carvedilol 37.5 mg in the morning and 25 mg in the evening.  If you need a refill on your cardiac medications before your next appointment, please call your pharmacy.   Lab work: Fasting lab work in 6 month If you have labs (blood work) drawn today and your tests are completely normal, you will receive your results only by: [name] MyChart Message (if you have MyChart) OR . A paper copy in the mail If you have any lab test that is abnormal or we need to change your treatment, we will call you to review the results.  Follow-Up: At [facility], you and your health needs are our priority.  As part of our continuing mission to provide you with exceptional heart care, we have created designated Provider Care Teams.  These Care Teams include your primary Cardiologist (physician) and Advanced Practice Providers (APPs -  Physician Assistants and Nurse Practitioners) who all work together to provide you with the care you need, when you need it. You will need a follow up appointment in 6 months.  Please call our office 2 months in advance to schedule this appointment.  You may see Shelva Majestic, MD or one of the following Advanced Practice Providers on your designated Care Team: [place], [place] . Fabian Sharp, PA-C

## 2018-09-25 ENCOUNTER — Encounter: Payer: Self-pay | Admitting: Cardiovascular Disease

## 2018-09-26 DIAGNOSIS — E7800 Pure hypercholesterolemia, unspecified: Secondary | ICD-10-CM | POA: Diagnosis not present

## 2018-09-26 DIAGNOSIS — E119 Type 2 diabetes mellitus without complications: Secondary | ICD-10-CM | POA: Diagnosis not present

## 2018-09-26 DIAGNOSIS — I10 Essential (primary) hypertension: Secondary | ICD-10-CM | POA: Diagnosis not present

## 2018-10-04 DIAGNOSIS — R42 Dizziness and giddiness: Secondary | ICD-10-CM | POA: Diagnosis not present

## 2018-10-04 DIAGNOSIS — H903 Sensorineural hearing loss, bilateral: Secondary | ICD-10-CM | POA: Diagnosis not present

## 2018-10-04 DIAGNOSIS — J302 Other seasonal allergic rhinitis: Secondary | ICD-10-CM | POA: Diagnosis not present

## 2018-10-08 DIAGNOSIS — S233XXA Sprain of ligaments of thoracic spine, initial encounter: Secondary | ICD-10-CM | POA: Diagnosis not present

## 2018-10-08 DIAGNOSIS — M9901 Segmental and somatic dysfunction of cervical region: Secondary | ICD-10-CM | POA: Diagnosis not present

## 2018-10-08 DIAGNOSIS — M9903 Segmental and somatic dysfunction of lumbar region: Secondary | ICD-10-CM | POA: Diagnosis not present

## 2018-10-08 DIAGNOSIS — S338XXA Sprain of other parts of lumbar spine and pelvis, initial encounter: Secondary | ICD-10-CM | POA: Diagnosis not present

## 2018-10-08 DIAGNOSIS — S134XXA Sprain of ligaments of cervical spine, initial encounter: Secondary | ICD-10-CM | POA: Diagnosis not present

## 2018-10-08 DIAGNOSIS — M9902 Segmental and somatic dysfunction of thoracic region: Secondary | ICD-10-CM | POA: Diagnosis not present

## 2018-10-14 DIAGNOSIS — Z1211 Encounter for screening for malignant neoplasm of colon: Secondary | ICD-10-CM | POA: Diagnosis not present

## 2018-10-14 DIAGNOSIS — Z1331 Encounter for screening for depression: Secondary | ICD-10-CM | POA: Diagnosis not present

## 2018-10-14 DIAGNOSIS — Z0000 Encounter for general adult medical examination without abnormal findings: Secondary | ICD-10-CM | POA: Diagnosis not present

## 2018-10-14 DIAGNOSIS — Z6832 Body mass index (BMI) 32.0-32.9, adult: Secondary | ICD-10-CM | POA: Diagnosis not present

## 2018-10-14 DIAGNOSIS — Z1339 Encounter for screening examination for other mental health and behavioral disorders: Secondary | ICD-10-CM | POA: Diagnosis not present

## 2018-10-14 DIAGNOSIS — Z125 Encounter for screening for malignant neoplasm of prostate: Secondary | ICD-10-CM | POA: Diagnosis not present

## 2018-10-14 DIAGNOSIS — Z7189 Other specified counseling: Secondary | ICD-10-CM | POA: Diagnosis not present

## 2018-10-14 DIAGNOSIS — E1165 Type 2 diabetes mellitus with hyperglycemia: Secondary | ICD-10-CM | POA: Diagnosis not present

## 2018-10-14 DIAGNOSIS — R5383 Other fatigue: Secondary | ICD-10-CM | POA: Diagnosis not present

## 2018-10-14 DIAGNOSIS — I2510 Atherosclerotic heart disease of native coronary artery without angina pectoris: Secondary | ICD-10-CM | POA: Diagnosis not present

## 2018-10-14 DIAGNOSIS — Z79899 Other long term (current) drug therapy: Secondary | ICD-10-CM | POA: Diagnosis not present

## 2018-10-14 DIAGNOSIS — Z299 Encounter for prophylactic measures, unspecified: Secondary | ICD-10-CM | POA: Diagnosis not present

## 2018-10-14 DIAGNOSIS — E7800 Pure hypercholesterolemia, unspecified: Secondary | ICD-10-CM | POA: Diagnosis not present

## 2018-10-20 IMAGING — DX DG CHEST 2V
2 series · 2 of 2 positions shown · non-contrast
Comparison: None.

CLINICAL DATA: Chest pain.

EXAM:
CHEST  2 VIEW

[chest pa]
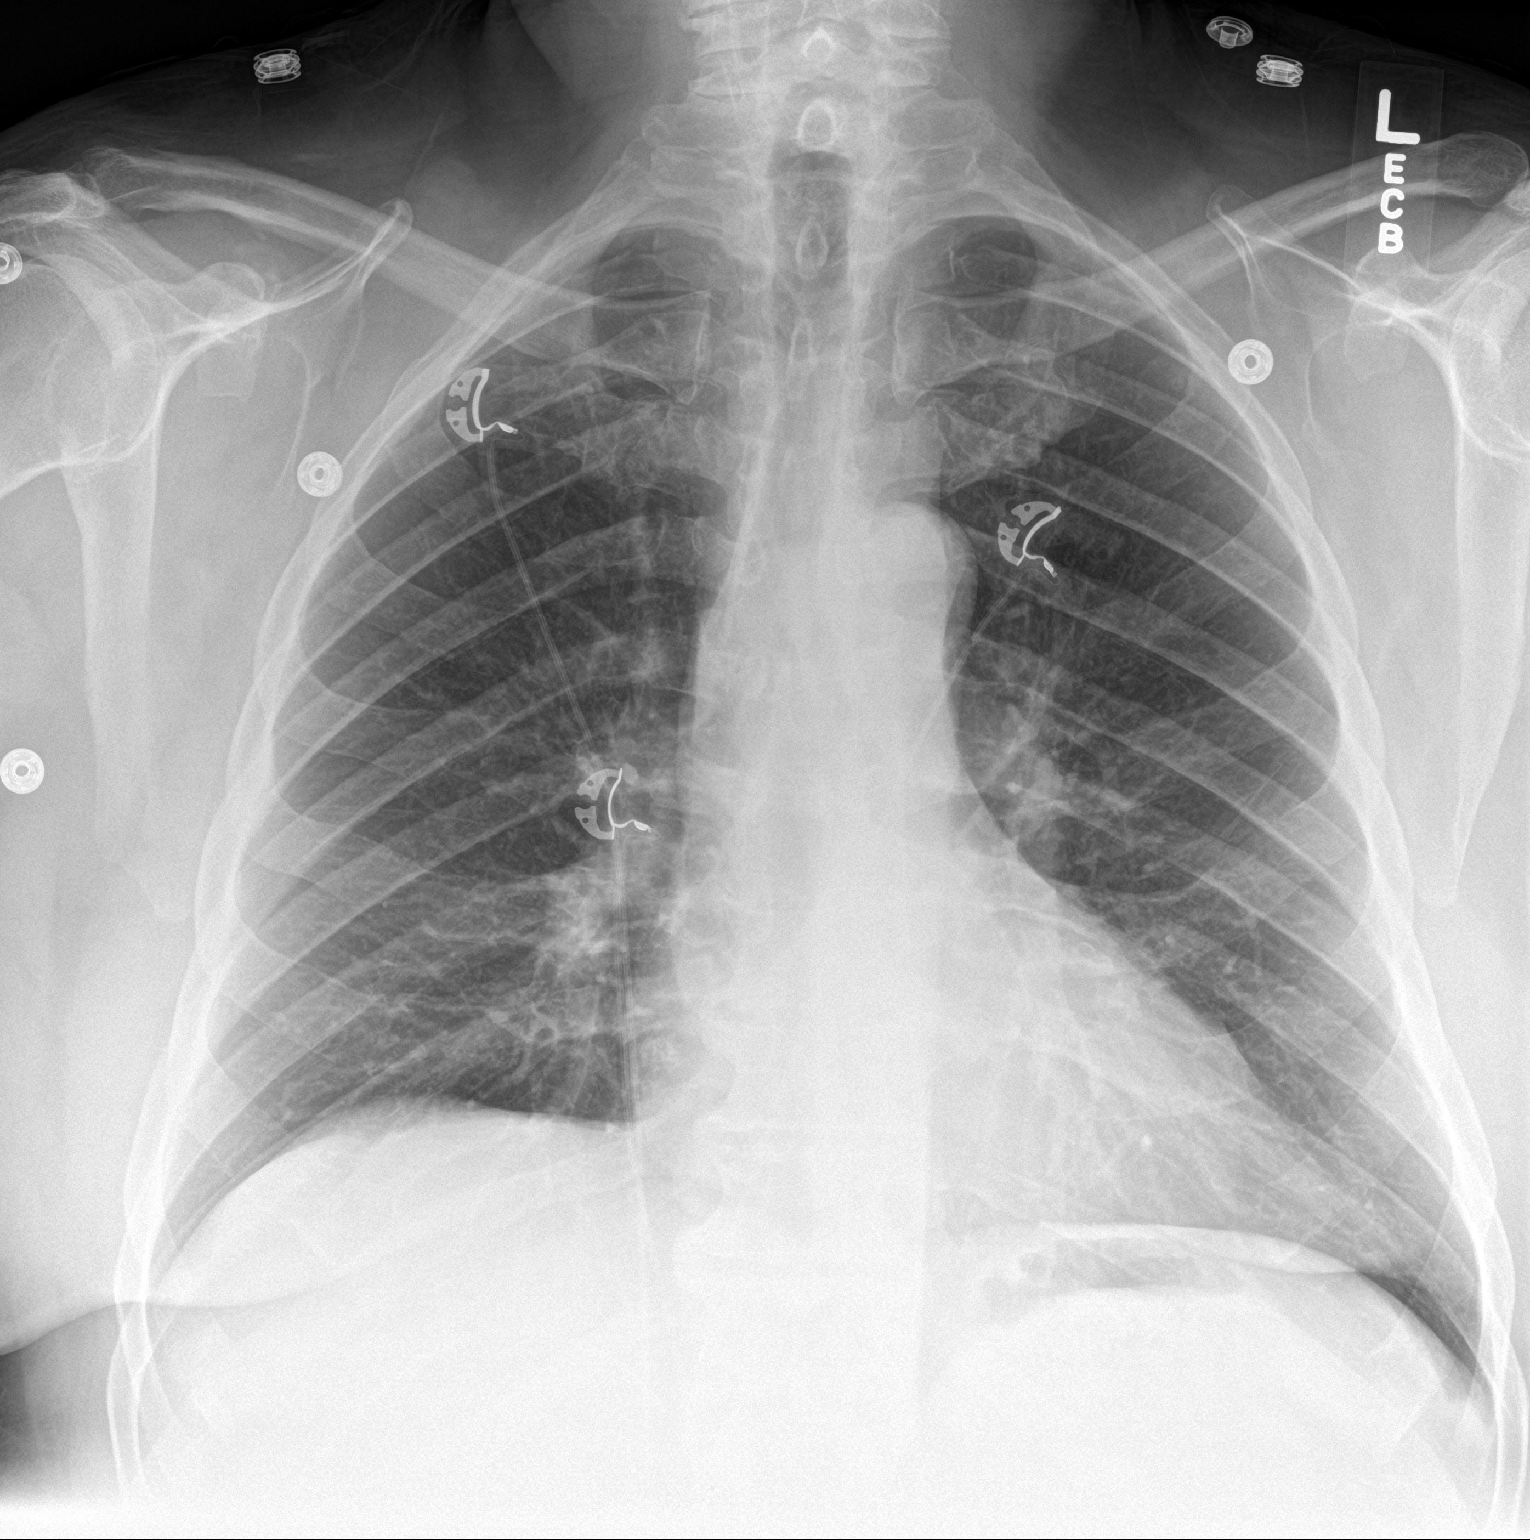

[chest lat]
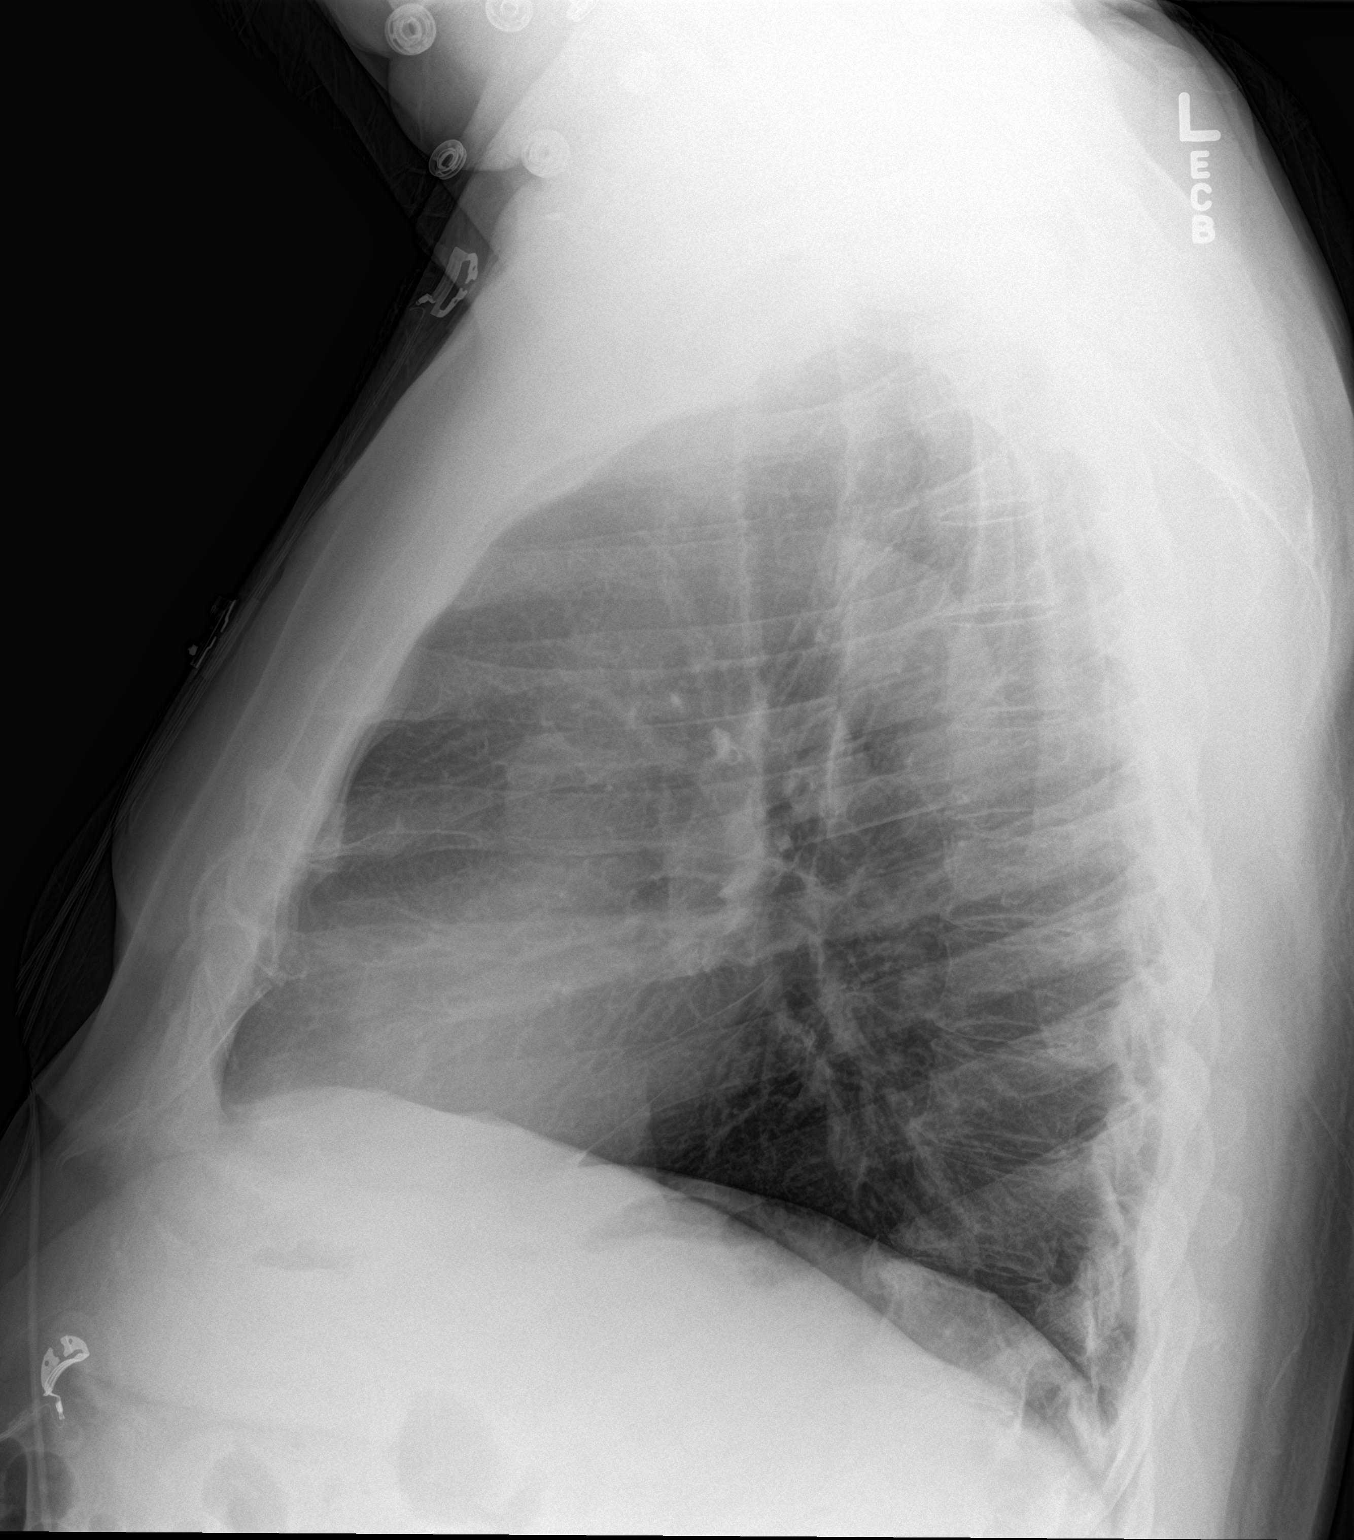

[2 of 2 positions shown; findings below may reference images not displayed]

FINDINGS: The heart size and mediastinal contours are within normal limits.
Both lungs are clear. No pneumothorax or pleural effusion is noted.
The visualized skeletal structures are unremarkable.
IMPRESSION: No active cardiopulmonary disease.

## 2018-10-22 DIAGNOSIS — S338XXA Sprain of other parts of lumbar spine and pelvis, initial encounter: Secondary | ICD-10-CM | POA: Diagnosis not present

## 2018-10-22 DIAGNOSIS — M9901 Segmental and somatic dysfunction of cervical region: Secondary | ICD-10-CM | POA: Diagnosis not present

## 2018-10-22 DIAGNOSIS — S134XXA Sprain of ligaments of cervical spine, initial encounter: Secondary | ICD-10-CM | POA: Diagnosis not present

## 2018-10-22 DIAGNOSIS — M9902 Segmental and somatic dysfunction of thoracic region: Secondary | ICD-10-CM | POA: Diagnosis not present

## 2018-10-22 DIAGNOSIS — M9903 Segmental and somatic dysfunction of lumbar region: Secondary | ICD-10-CM | POA: Diagnosis not present

## 2018-10-22 DIAGNOSIS — S233XXA Sprain of ligaments of thoracic spine, initial encounter: Secondary | ICD-10-CM | POA: Diagnosis not present

## 2018-10-22 IMAGING — CR DG CHEST 1V PORT
2 series · 2 of 2 positions shown · non-contrast
Comparison: 01/04/2017

CLINICAL DATA: Chest tube, CABG

EXAM:
PORTABLE CHEST 1 VIEW

[AP (1 of 2)]
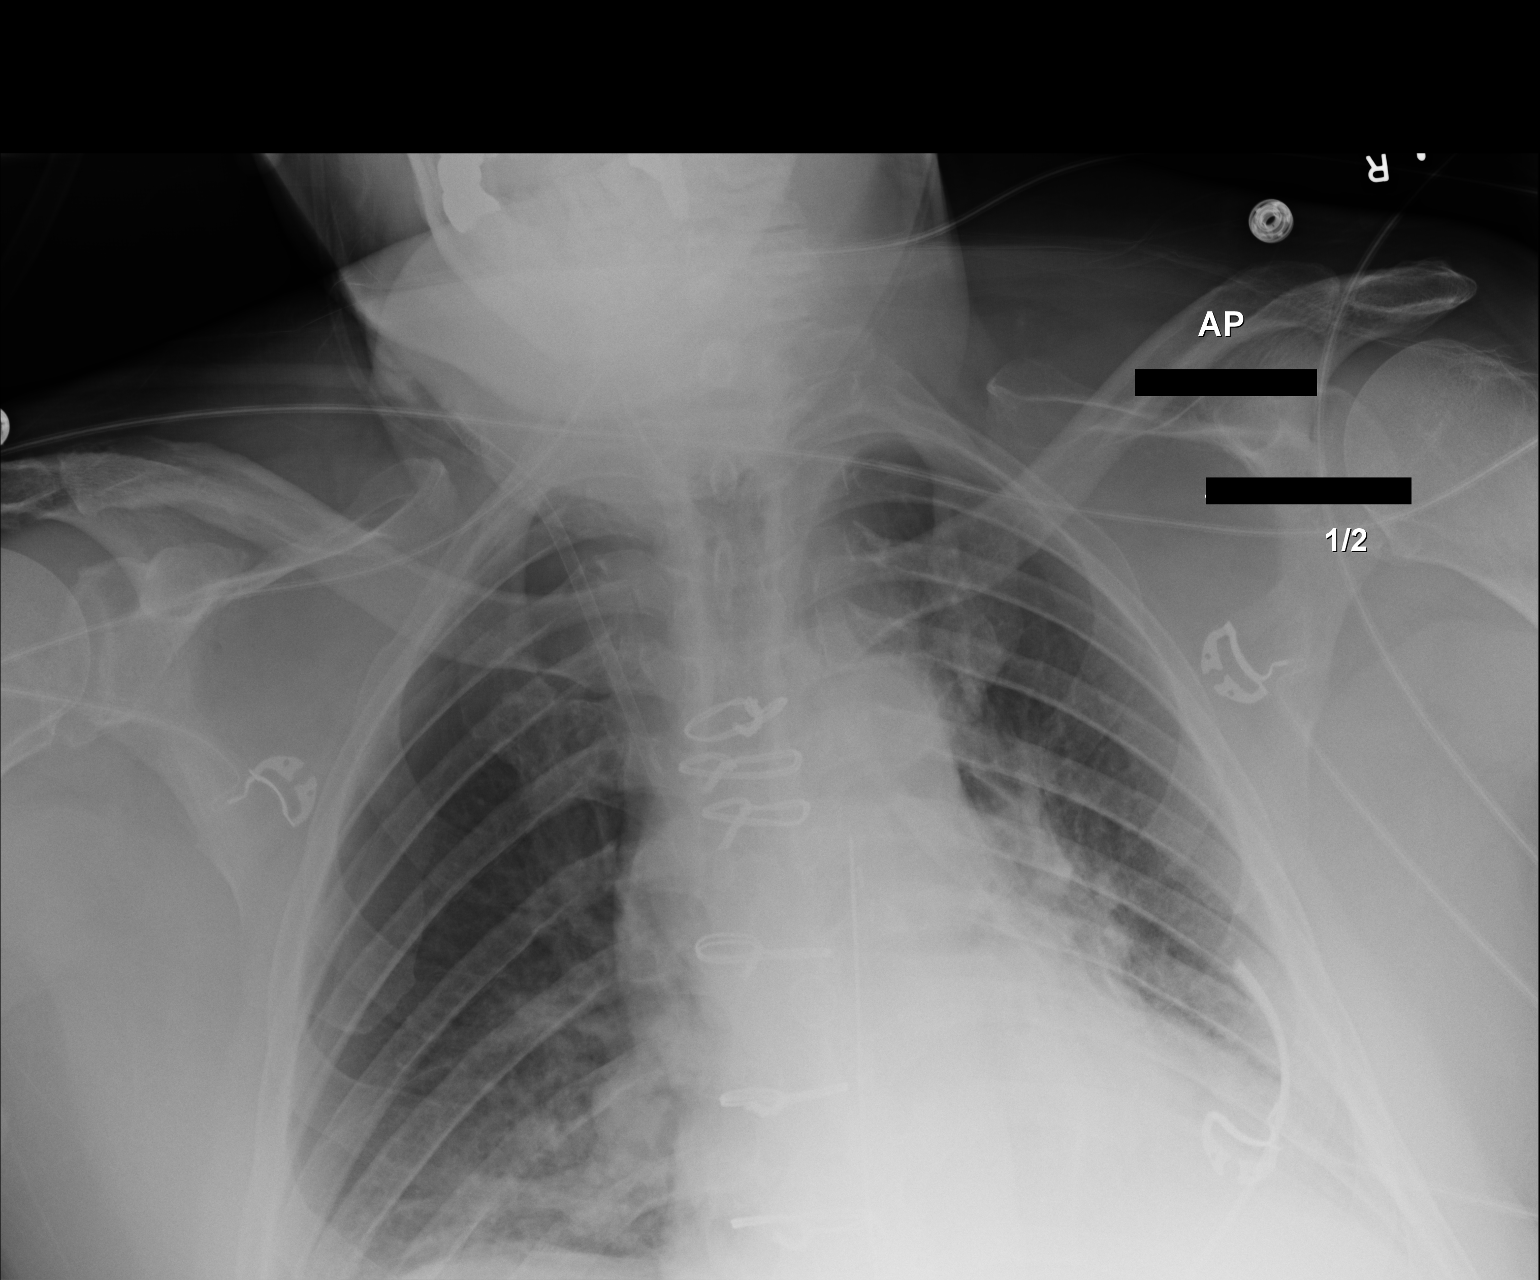

[AP (2 of 2)]
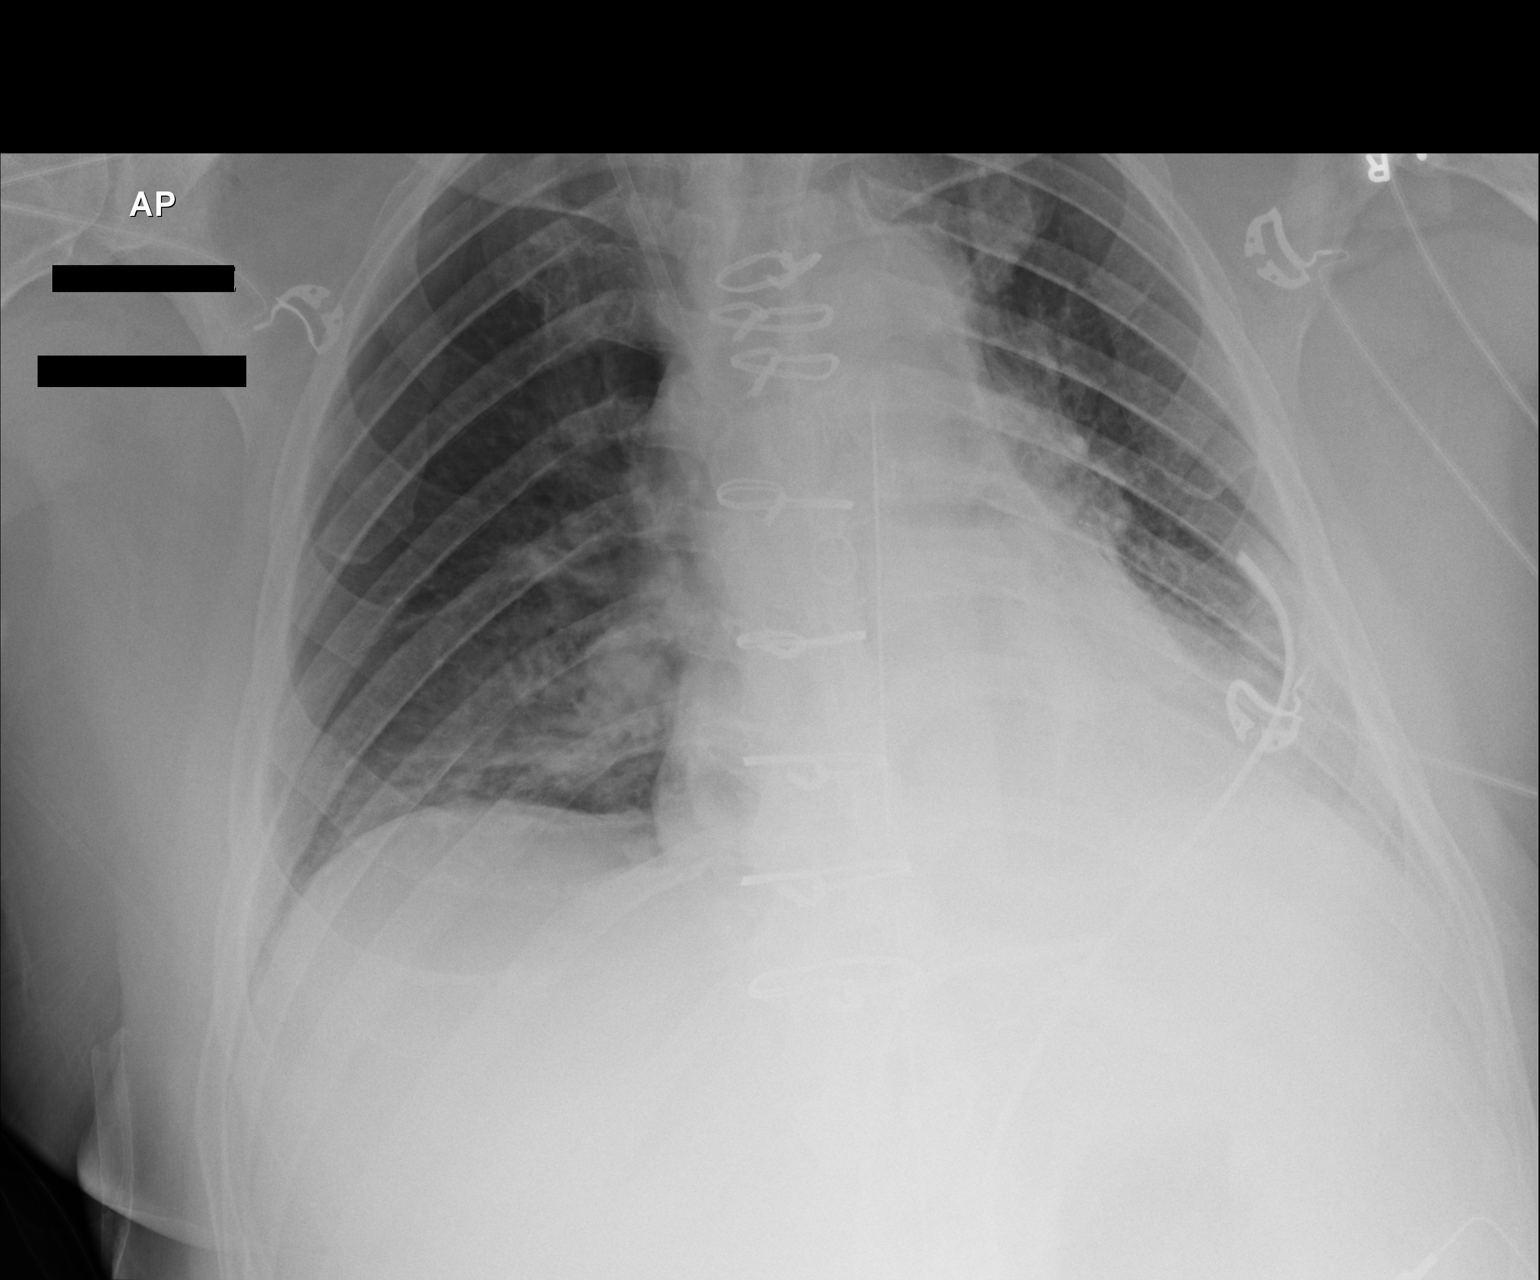

[2 of 2 positions shown; findings below may reference images not displayed]

FINDINGS: Left basilar chest tube remains in place without pneumothorax.
Interval removal of the NG tube, endotracheal tube and Swan-Ganz
catheter. Bibasilar opacities compatible with atelectasis. Mild
vascular congestion.
IMPRESSION: No pneumothorax.  Interval extubation.  Bibasilar atelectasis.

## 2018-10-23 DIAGNOSIS — E7800 Pure hypercholesterolemia, unspecified: Secondary | ICD-10-CM | POA: Diagnosis not present

## 2018-10-23 DIAGNOSIS — E119 Type 2 diabetes mellitus without complications: Secondary | ICD-10-CM | POA: Diagnosis not present

## 2018-10-23 DIAGNOSIS — I10 Essential (primary) hypertension: Secondary | ICD-10-CM | POA: Diagnosis not present

## 2018-10-25 IMAGING — CR DG CHEST 1V PORT
1 series · 1 of 1 positions shown · non-contrast
Comparison: Chest radiograph January 07, 2017

CLINICAL DATA: RIGHT chest pain tonight, assess chest tube.

EXAM:
PORTABLE CHEST 1 VIEW

[AP]
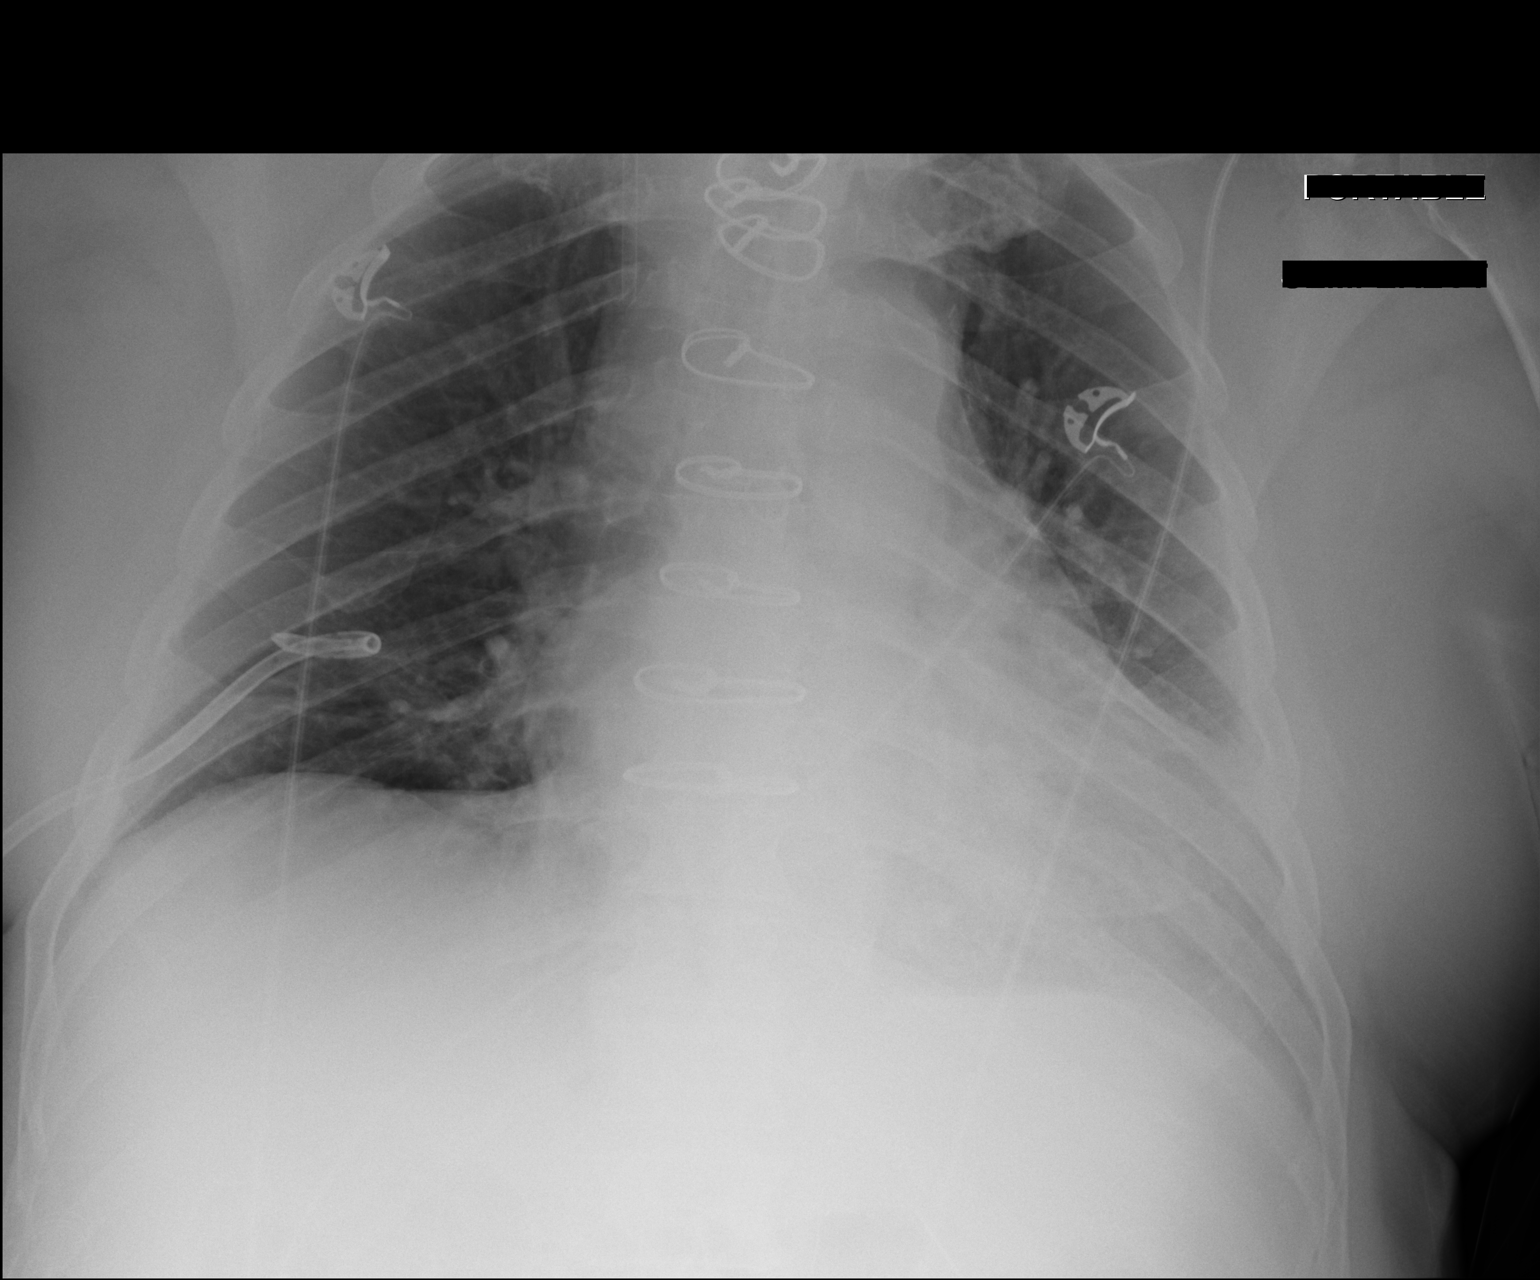

[1 of 1 positions shown; findings below may reference images not displayed]

FINDINGS: Cardiac silhouette is mildly enlarged, mediastinal silhouette is
nonsuspicious, status post median sternotomy for CABG. RIGHT lung
base pigtail chest tube, trace residual apical pneumo thorax.
Pulmonary vascular congestion without pleural effusion or focal
consolidation. Soft tissue planes and included osseous structures
are nonsuspicious.
IMPRESSION: RIGHT chest tube and trace apical pneumothorax.

Stable cardiomegaly and pulmonary vascular congestion.

## 2018-10-27 IMAGING — CR DG CHEST 2V
2 series · 2 of 2 positions shown · non-contrast
Comparison: 01/09/2017 and earlier.

CLINICAL DATA: 70-year-old male status post CABG on 01/04/2017.
Mild chest pain.

EXAM:
CHEST  2 VIEW

[chest pa]
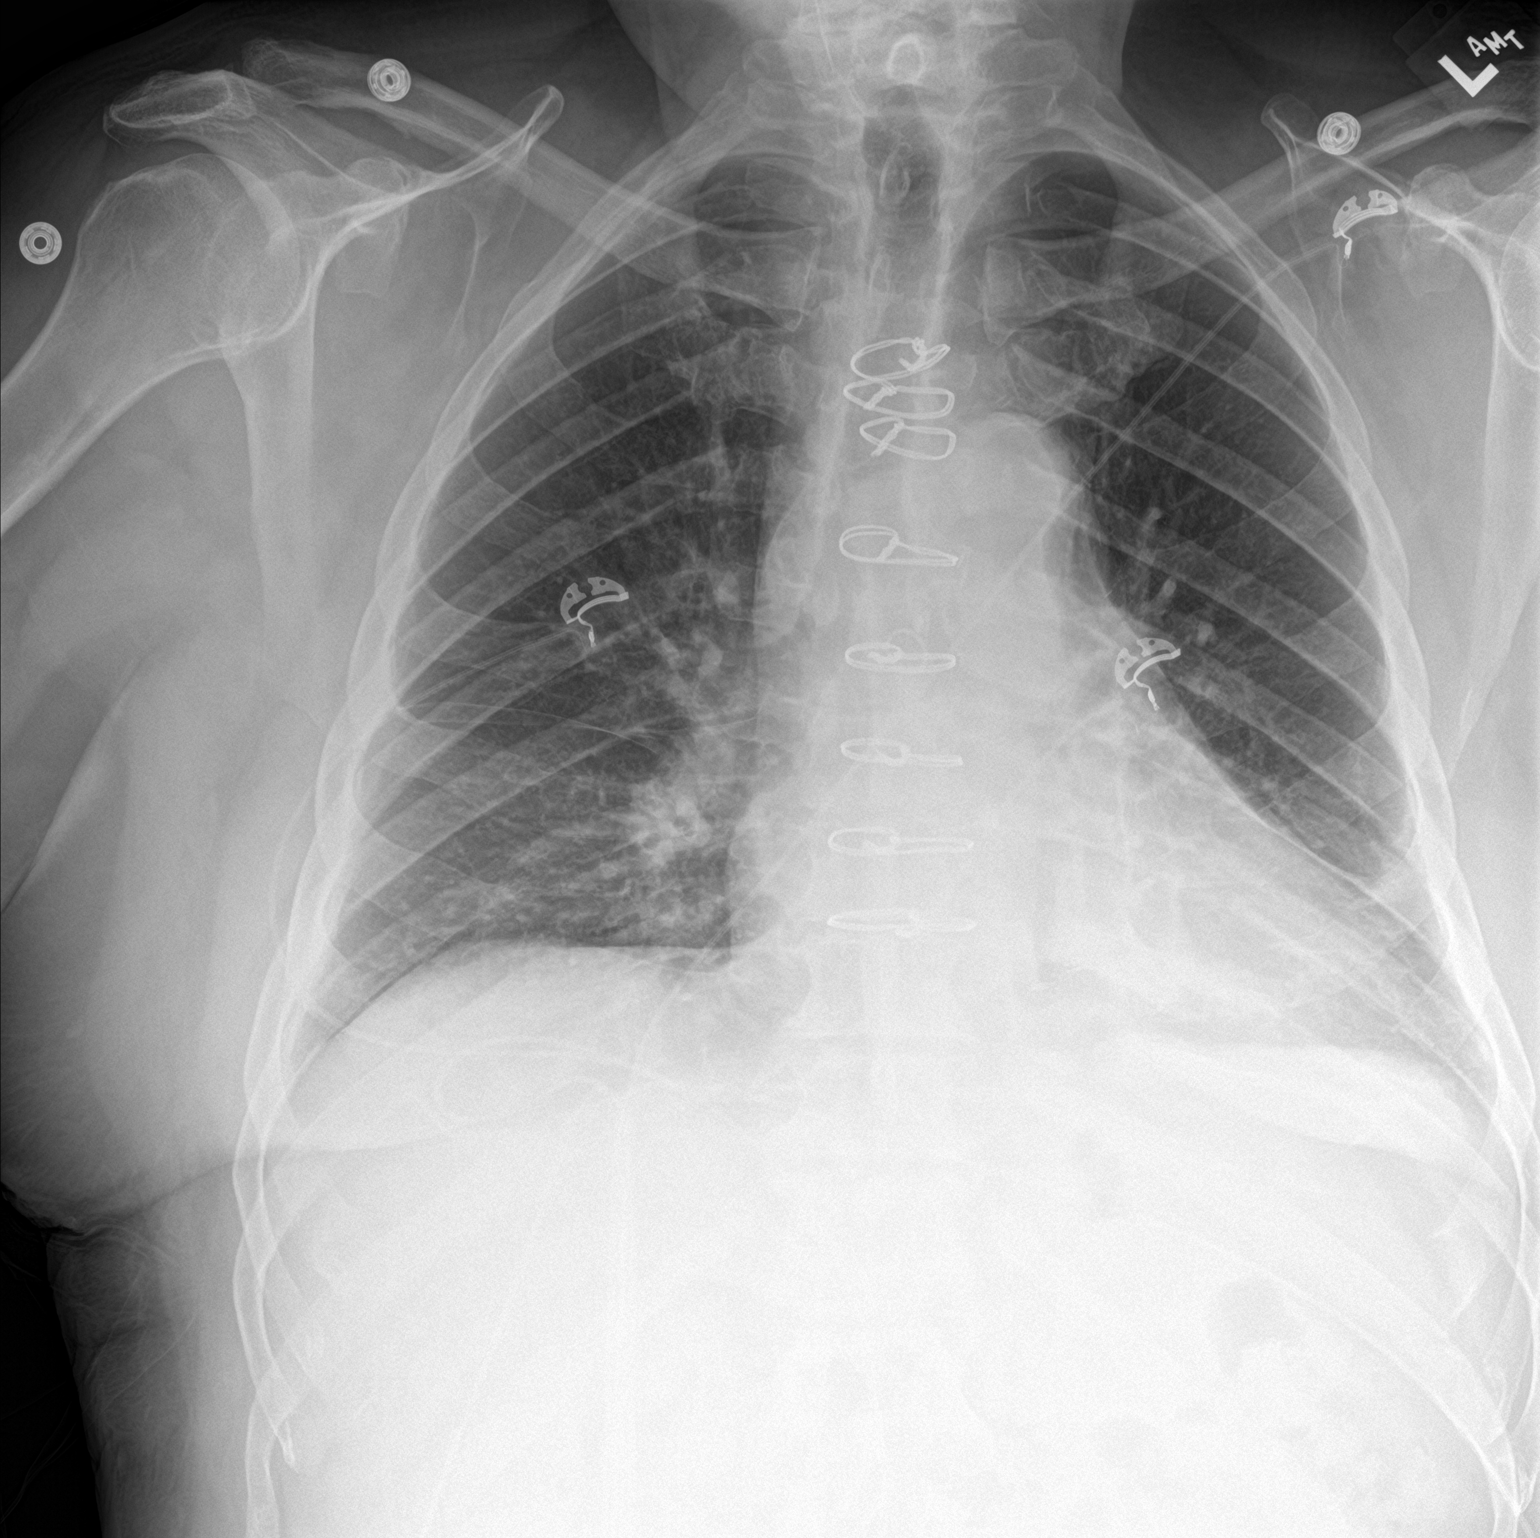

[chest lat]
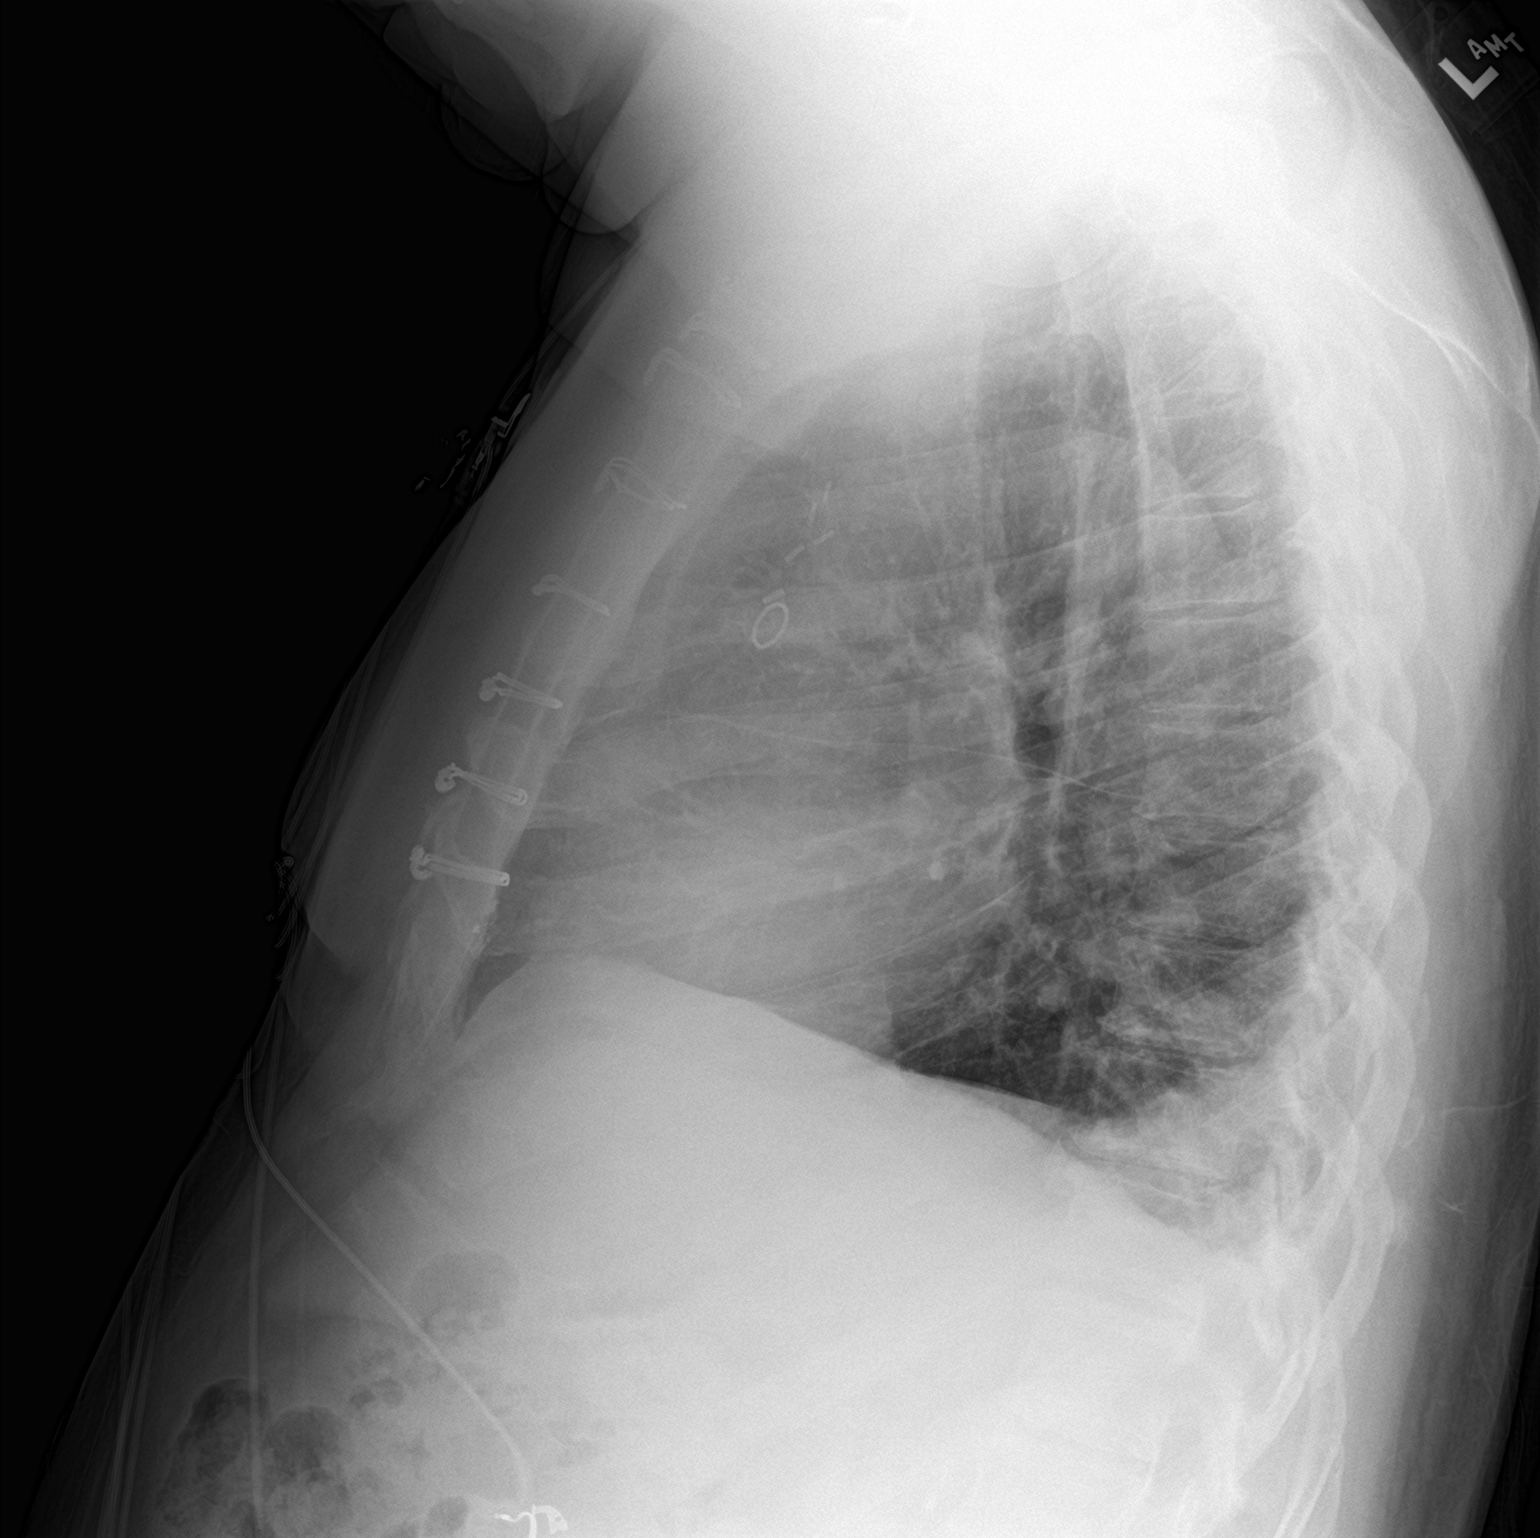

[2 of 2 positions shown; findings below may reference images not displayed]

FINDINGS: Sequelae of sternotomy and CABG. Stable cardiac size and mediastinal
contours. Visualized tracheal air column is within normal limits. No
pneumothorax or pulmonary edema. Small pleural effusions and streaky
left lung base opacity. Left lung base ventilation appears mildly
improved since yesterday. Negative visible bowel gas pattern. No new
osseous abnormality. Calcified aortic atherosclerosis.
IMPRESSION: Small pleural effusions and left lung base atelectasis. No
pneumothorax or pulmonary edema.

## 2018-10-28 DIAGNOSIS — R51 Headache: Secondary | ICD-10-CM | POA: Diagnosis not present

## 2018-10-28 DIAGNOSIS — R002 Palpitations: Secondary | ICD-10-CM | POA: Diagnosis not present

## 2018-10-28 DIAGNOSIS — I10 Essential (primary) hypertension: Secondary | ICD-10-CM | POA: Diagnosis not present

## 2018-10-28 DIAGNOSIS — Z951 Presence of aortocoronary bypass graft: Secondary | ICD-10-CM | POA: Diagnosis not present

## 2018-11-05 DIAGNOSIS — S338XXA Sprain of other parts of lumbar spine and pelvis, initial encounter: Secondary | ICD-10-CM | POA: Diagnosis not present

## 2018-11-05 DIAGNOSIS — M9903 Segmental and somatic dysfunction of lumbar region: Secondary | ICD-10-CM | POA: Diagnosis not present

## 2018-11-05 DIAGNOSIS — M9902 Segmental and somatic dysfunction of thoracic region: Secondary | ICD-10-CM | POA: Diagnosis not present

## 2018-11-05 DIAGNOSIS — S233XXA Sprain of ligaments of thoracic spine, initial encounter: Secondary | ICD-10-CM | POA: Diagnosis not present

## 2018-11-05 DIAGNOSIS — M9901 Segmental and somatic dysfunction of cervical region: Secondary | ICD-10-CM | POA: Diagnosis not present

## 2018-11-05 DIAGNOSIS — S134XXA Sprain of ligaments of cervical spine, initial encounter: Secondary | ICD-10-CM | POA: Diagnosis not present

## 2018-12-02 DIAGNOSIS — E119 Type 2 diabetes mellitus without complications: Secondary | ICD-10-CM | POA: Diagnosis not present

## 2018-12-02 DIAGNOSIS — I10 Essential (primary) hypertension: Secondary | ICD-10-CM | POA: Diagnosis not present

## 2018-12-02 DIAGNOSIS — E7800 Pure hypercholesterolemia, unspecified: Secondary | ICD-10-CM | POA: Diagnosis not present

## 2018-12-04 DIAGNOSIS — L110 Acquired keratosis follicularis: Secondary | ICD-10-CM | POA: Diagnosis not present

## 2018-12-04 DIAGNOSIS — E1140 Type 2 diabetes mellitus with diabetic neuropathy, unspecified: Secondary | ICD-10-CM | POA: Diagnosis not present

## 2018-12-04 DIAGNOSIS — L609 Nail disorder, unspecified: Secondary | ICD-10-CM | POA: Diagnosis not present

## 2019-01-02 DIAGNOSIS — E119 Type 2 diabetes mellitus without complications: Secondary | ICD-10-CM | POA: Diagnosis not present

## 2019-01-02 DIAGNOSIS — E7800 Pure hypercholesterolemia, unspecified: Secondary | ICD-10-CM | POA: Diagnosis not present

## 2019-01-02 DIAGNOSIS — I10 Essential (primary) hypertension: Secondary | ICD-10-CM | POA: Diagnosis not present

## 2019-01-14 DIAGNOSIS — I10 Essential (primary) hypertension: Secondary | ICD-10-CM | POA: Diagnosis not present

## 2019-01-14 DIAGNOSIS — E119 Type 2 diabetes mellitus without complications: Secondary | ICD-10-CM | POA: Diagnosis not present

## 2019-01-14 DIAGNOSIS — E7800 Pure hypercholesterolemia, unspecified: Secondary | ICD-10-CM | POA: Diagnosis not present

## 2019-01-24 DIAGNOSIS — Z299 Encounter for prophylactic measures, unspecified: Secondary | ICD-10-CM | POA: Diagnosis not present

## 2019-01-24 DIAGNOSIS — Z6832 Body mass index (BMI) 32.0-32.9, adult: Secondary | ICD-10-CM | POA: Diagnosis not present

## 2019-01-24 DIAGNOSIS — I10 Essential (primary) hypertension: Secondary | ICD-10-CM | POA: Diagnosis not present

## 2019-01-24 DIAGNOSIS — K219 Gastro-esophageal reflux disease without esophagitis: Secondary | ICD-10-CM | POA: Diagnosis not present

## 2019-01-24 DIAGNOSIS — E1165 Type 2 diabetes mellitus with hyperglycemia: Secondary | ICD-10-CM | POA: Diagnosis not present

## 2019-02-10 DIAGNOSIS — S233XXA Sprain of ligaments of thoracic spine, initial encounter: Secondary | ICD-10-CM | POA: Diagnosis not present

## 2019-02-10 DIAGNOSIS — S134XXA Sprain of ligaments of cervical spine, initial encounter: Secondary | ICD-10-CM | POA: Diagnosis not present

## 2019-02-10 DIAGNOSIS — M9902 Segmental and somatic dysfunction of thoracic region: Secondary | ICD-10-CM | POA: Diagnosis not present

## 2019-02-10 DIAGNOSIS — S338XXA Sprain of other parts of lumbar spine and pelvis, initial encounter: Secondary | ICD-10-CM | POA: Diagnosis not present

## 2019-02-10 DIAGNOSIS — M9901 Segmental and somatic dysfunction of cervical region: Secondary | ICD-10-CM | POA: Diagnosis not present

## 2019-02-10 DIAGNOSIS — M9903 Segmental and somatic dysfunction of lumbar region: Secondary | ICD-10-CM | POA: Diagnosis not present

## 2019-02-13 DIAGNOSIS — I10 Essential (primary) hypertension: Secondary | ICD-10-CM | POA: Diagnosis not present

## 2019-02-13 DIAGNOSIS — E7800 Pure hypercholesterolemia, unspecified: Secondary | ICD-10-CM | POA: Diagnosis not present

## 2019-02-13 DIAGNOSIS — E119 Type 2 diabetes mellitus without complications: Secondary | ICD-10-CM | POA: Diagnosis not present

## 2019-02-14 DIAGNOSIS — M9902 Segmental and somatic dysfunction of thoracic region: Secondary | ICD-10-CM | POA: Diagnosis not present

## 2019-02-14 DIAGNOSIS — M9903 Segmental and somatic dysfunction of lumbar region: Secondary | ICD-10-CM | POA: Diagnosis not present

## 2019-02-14 DIAGNOSIS — S134XXA Sprain of ligaments of cervical spine, initial encounter: Secondary | ICD-10-CM | POA: Diagnosis not present

## 2019-02-14 DIAGNOSIS — S338XXA Sprain of other parts of lumbar spine and pelvis, initial encounter: Secondary | ICD-10-CM | POA: Diagnosis not present

## 2019-02-14 DIAGNOSIS — S233XXA Sprain of ligaments of thoracic spine, initial encounter: Secondary | ICD-10-CM | POA: Diagnosis not present

## 2019-02-14 DIAGNOSIS — M9901 Segmental and somatic dysfunction of cervical region: Secondary | ICD-10-CM | POA: Diagnosis not present

## 2019-03-05 DIAGNOSIS — S134XXA Sprain of ligaments of cervical spine, initial encounter: Secondary | ICD-10-CM | POA: Diagnosis not present

## 2019-03-05 DIAGNOSIS — M9901 Segmental and somatic dysfunction of cervical region: Secondary | ICD-10-CM | POA: Diagnosis not present

## 2019-03-05 DIAGNOSIS — S338XXA Sprain of other parts of lumbar spine and pelvis, initial encounter: Secondary | ICD-10-CM | POA: Diagnosis not present

## 2019-03-05 DIAGNOSIS — S233XXA Sprain of ligaments of thoracic spine, initial encounter: Secondary | ICD-10-CM | POA: Diagnosis not present

## 2019-03-05 DIAGNOSIS — M9903 Segmental and somatic dysfunction of lumbar region: Secondary | ICD-10-CM | POA: Diagnosis not present

## 2019-03-05 DIAGNOSIS — M9902 Segmental and somatic dysfunction of thoracic region: Secondary | ICD-10-CM | POA: Diagnosis not present

## 2019-03-11 DIAGNOSIS — E119 Type 2 diabetes mellitus without complications: Secondary | ICD-10-CM | POA: Diagnosis not present

## 2019-03-11 DIAGNOSIS — H2513 Age-related nuclear cataract, bilateral: Secondary | ICD-10-CM | POA: Diagnosis not present

## 2019-03-21 DIAGNOSIS — I10 Essential (primary) hypertension: Secondary | ICD-10-CM | POA: Diagnosis not present

## 2019-03-21 DIAGNOSIS — E7800 Pure hypercholesterolemia, unspecified: Secondary | ICD-10-CM | POA: Diagnosis not present

## 2019-03-21 DIAGNOSIS — E119 Type 2 diabetes mellitus without complications: Secondary | ICD-10-CM | POA: Diagnosis not present

## 2019-03-26 DIAGNOSIS — E1140 Type 2 diabetes mellitus with diabetic neuropathy, unspecified: Secondary | ICD-10-CM | POA: Diagnosis not present

## 2019-03-26 DIAGNOSIS — L609 Nail disorder, unspecified: Secondary | ICD-10-CM | POA: Diagnosis not present

## 2019-03-26 DIAGNOSIS — L110 Acquired keratosis follicularis: Secondary | ICD-10-CM | POA: Diagnosis not present

## 2019-03-27 DIAGNOSIS — S338XXA Sprain of other parts of lumbar spine and pelvis, initial encounter: Secondary | ICD-10-CM | POA: Diagnosis not present

## 2019-03-27 DIAGNOSIS — M9903 Segmental and somatic dysfunction of lumbar region: Secondary | ICD-10-CM | POA: Diagnosis not present

## 2019-03-27 DIAGNOSIS — S134XXA Sprain of ligaments of cervical spine, initial encounter: Secondary | ICD-10-CM | POA: Diagnosis not present

## 2019-03-27 DIAGNOSIS — M9902 Segmental and somatic dysfunction of thoracic region: Secondary | ICD-10-CM | POA: Diagnosis not present

## 2019-03-27 DIAGNOSIS — M9901 Segmental and somatic dysfunction of cervical region: Secondary | ICD-10-CM | POA: Diagnosis not present

## 2019-03-27 DIAGNOSIS — S233XXA Sprain of ligaments of thoracic spine, initial encounter: Secondary | ICD-10-CM | POA: Diagnosis not present

## 2019-04-16 DIAGNOSIS — I10 Essential (primary) hypertension: Secondary | ICD-10-CM | POA: Diagnosis not present

## 2019-04-16 DIAGNOSIS — E119 Type 2 diabetes mellitus without complications: Secondary | ICD-10-CM | POA: Diagnosis not present

## 2019-04-16 DIAGNOSIS — E7800 Pure hypercholesterolemia, unspecified: Secondary | ICD-10-CM | POA: Diagnosis not present

## 2019-04-16 DIAGNOSIS — Z79899 Other long term (current) drug therapy: Secondary | ICD-10-CM | POA: Diagnosis not present

## 2019-04-17 DIAGNOSIS — M9903 Segmental and somatic dysfunction of lumbar region: Secondary | ICD-10-CM | POA: Diagnosis not present

## 2019-04-17 DIAGNOSIS — S338XXA Sprain of other parts of lumbar spine and pelvis, initial encounter: Secondary | ICD-10-CM | POA: Diagnosis not present

## 2019-04-17 DIAGNOSIS — S233XXA Sprain of ligaments of thoracic spine, initial encounter: Secondary | ICD-10-CM | POA: Diagnosis not present

## 2019-04-17 DIAGNOSIS — M9901 Segmental and somatic dysfunction of cervical region: Secondary | ICD-10-CM | POA: Diagnosis not present

## 2019-04-17 DIAGNOSIS — M9902 Segmental and somatic dysfunction of thoracic region: Secondary | ICD-10-CM | POA: Diagnosis not present

## 2019-04-17 DIAGNOSIS — S134XXA Sprain of ligaments of cervical spine, initial encounter: Secondary | ICD-10-CM | POA: Diagnosis not present

## 2019-04-17 LAB — HEPATIC FUNCTION PANEL
ALT: 14 IU/L (ref 0–44)
AST: 15 IU/L (ref 0–40)
Albumin: 4.3 g/dL (ref 3.7–4.7)
Alkaline Phosphatase: 49 IU/L (ref 39–117)
Bilirubin Total: 0.3 mg/dL (ref 0.0–1.2)
Bilirubin, Direct: 0.14 mg/dL (ref 0.00–0.40)
Total Protein: 6.7 g/dL (ref 6.0–8.5)

## 2019-04-17 LAB — LIPID PANEL
Chol/HDL Ratio: 4.6 ratio (ref 0.0–5.0)
Cholesterol, Total: 119 mg/dL (ref 100–199)
HDL: 26 mg/dL — ABNORMAL LOW (ref >39)
LDL Chol Calc (NIH): 58 mg/dL (ref 0–99)
Triglycerides: 215 mg/dL — ABNORMAL HIGH (ref 0–149)
VLDL Cholesterol Cal: 35 mg/dL (ref 5–40)

## 2019-04-29 ENCOUNTER — Other Ambulatory Visit: Payer: Self-pay

## 2019-04-29 ENCOUNTER — Encounter: Payer: Self-pay | Admitting: Cardiovascular Disease

## 2019-04-29 ENCOUNTER — Ambulatory Visit (INDEPENDENT_AMBULATORY_CARE_PROVIDER_SITE_OTHER): Payer: Medicare Other | Admitting: Cardiovascular Disease

## 2019-04-29 VITALS — BP 140/64 | HR 62 | Ht 74.0 in | Wt 246.8 lb

## 2019-04-29 DIAGNOSIS — E785 Hyperlipidemia, unspecified: Secondary | ICD-10-CM

## 2019-04-29 DIAGNOSIS — E782 Mixed hyperlipidemia: Secondary | ICD-10-CM

## 2019-04-29 DIAGNOSIS — I2510 Atherosclerotic heart disease of native coronary artery without angina pectoris: Secondary | ICD-10-CM

## 2019-04-29 DIAGNOSIS — Z951 Presence of aortocoronary bypass graft: Secondary | ICD-10-CM

## 2019-04-29 DIAGNOSIS — Z9989 Dependence on other enabling machines and devices: Secondary | ICD-10-CM

## 2019-04-29 DIAGNOSIS — E669 Obesity, unspecified: Secondary | ICD-10-CM | POA: Diagnosis not present

## 2019-04-29 DIAGNOSIS — G4733 Obstructive sleep apnea (adult) (pediatric): Secondary | ICD-10-CM | POA: Diagnosis not present

## 2019-04-29 NOTE — Patient Instructions (Addendum)
Medication Instructions:  CONTINUE CARVEDILOL AS YOU ARE TAKING If you need a refill on your cardiac medications before your next appointment, please call your pharmacy. Follow-Up: You will need a follow up appointment in 6 months.  You may see Shelva Majestic, MD or one of the following Advanced Practice Providers on your designated Care Team: [place], [place] . Fabian Sharp, PA-C     At [facility], you and your health needs are our priority.  As part of our continuing mission to provide you with exceptional heart care, we have created designated Provider Care Teams.  These Care Teams include your primary Cardiologist (physician) and Advanced Practice Providers (APPs -  Physician Assistants and Nurse Practitioners) who all work together to provide you with the care you need, when you need it.  Thank you for choosing [facility]!!

## 2019-04-29 NOTE — Progress Notes (Signed)
Patient ID: William Orr, male   DOB: 05-19-1946, 73 y.o.   MRN: [id number]    Primary M.D.: Dr.A Lora Havens  HPI: [name] is a 73 year old African American male who presents to the office for a 7 month follow-up cardiology/sleep evaluation.  [name]  has a long history of hypertension,  type 2 diabetes mellitus, and hyperlipidemia. He  underwent an exercise nuclear study in [place] for atypical chest pain which revealed normal perfusion with an ejection fraction of 61%. There was no evidence for ischemia and he had normal wall motion. He was hypertensive throughout the study.  He has been maintained on blood pressure medicines consisting of diltiazem 300 mg daily, carvedilol 25 mg twice a day, and losartan HCT 100/25 mg. He previously, his had taken simvastatin 20 mg for hyperlipidemia  but self discontinued this.  Recently, he has been taking over-the-counter supplements with fair amount of omega-3 fatty acids for elevated triglycerides.[name] He takes Glucophage 850 mg in the morning and 2 tablets at night.   He has had some instances of atypical, probable musculoskeletal chest pain which improved with nonsteroidal therapy.  He underwent a 2-D echo Doppler study on 09/25/2013 and this revealed an ejection fraction of 60-65% with mild focal basal hypertrophy of the septum had normal diastolic parameters. There was moderate thickening of the trileaflet aortic valve consistent with aortic valve sclerosis. He had mild left atrial dilatation. His right ventricle was upper normal to mildly dilated.    Due to my concerns that he may have significant obstructive sleep apnea he underwent a diagnostic polysomnogram in March 2015 which revealed mild sleep apnea overall, with an AHI of 5.8, but he had moderate sleep apnea during REM sleep with an AHI of 25/hr.  He also had significant oxygen desaturation to 84% with non-REM sleep and 76% with REM sleep and had evidence for very loud snoring.  His  periodic limb movement index was 6.7 per hour with 9.5 per hour to arousal.  He underwent CPAP titration and 11 cm pressure was recommended.  He has a ResMed AirSense10 AutoSet.  A download from 03/11/2014 through 04/09/2014 revealed that he is meeting Medicare compliance with 100% days of the device usage.  97% of the time was used at greater than 4 hours.  With his current setting, AHI is excellent at 1.0.  His were index was 0.4.  He's using a fullface mask.  His DME company is [employer] in [place], [place].  Subjectively, since initiating therapy.  He feels markedly improved.  He denies daytime sleepiness.  He denies breakthrough snoring.  His sleep is more restorative.  He has more energy.  He denies hypersomnolence.  When I saw him in 2017, he  continued to use CPAP but occasional night he would fall asleep before putting on his CPAP unit.  He does not sleep as well most nights and oftentimes wakes up several more times to go to the bathroom. Recently, he has noticed that he may experience some increased palpitations in the morning.  He has not been able to correlate if this is related to potential night sweats.   A download was obtained from 09/27/2015 through 12/25/2015.  This showed 77% of days of use with 69% of the days greater than 4 hours.  He was averaging 5 hours and 42 minutes of CPAP use.  His AHI at 11 cm water pressure remained excellent at 0.7.    A 2-D echo Doppler study revealed an EF of 60-65%.  There was grade 2 diastolic dysfunction.  There was aortic valve sclerosis without stenosis or regurgitation.  There was suggestion that there was mildly reduced RV function.  Since I last saw him, he was admitted to [facility] with unstable angina and underwent catheterization on 01/02/2017 in the setting of a non-ST segment MI.  He was found to have multifocal high-grade disease with complex bifurcation stenosis of his proximal LAD and diagonal.  He underwent CABG revascularization surgery  by Dr. [name] on 01/04/2017 and had a LIMA placed to his distal LAD, vein graft to the first diagonal vessel, and vein graft to the second diagonal vessel.  He was seen in follow-up by Almyra Deforest, PAC.  He had significant hypertriglyceridemia and was started on lovaza and due to myalgias with Crestor statin therapy was changed to simvastatin, which he has tolerated.  He completed 8 weeks of cardiac rehabilitation with his last exercise this morning.  Typically his blood pressure at cardiac rehabilitation systolically is in the 841L to 130s.  He denies any recurrent chest pain or palpitations.  He continues to use CPAP with 100% compliance with [employer] as his DME company.  He's not had a recent download.    When I saw him in September 2018.  His blood pressure was elevated on losartan 100 mg, diltiazem 3 mg, and carvedilol 25 mg twice a day.  He was taken [place] X3 omega-3 fatty acid, which included 1.125 g of EPA and to 75 mg of DHA.  Lipid studies were improved.  Triglycerides were still elevated at 340, down from 413.  Fenofibrate 145 to his medical regimen.  Over the past several months, he has felt well.  He's been without chest pain.  He uses CPAP with 100% compliance.  He denies chest pain, PND, orthopnea.  He saw Dr. Brigitte Pulse and his hemoglobin A1c was 6.0.  He is off statins due to statin intolerance.    When I  saw him in 2019 hisblood pressure was stable.  He was not having any anginal symptoms.  Is bradycardic and I reduced his low-dose. He was  continuing to use CPAP.  Over the past 6 months, he has remained stable.  He was in need to undergo endoscopy scheduled for April 24, 2018 and clearance was necessary to hold his aspirin and Plavix.  He denies any recurrent anginal symptoms.  He continues to use CPAP.  A download was obtained in April 19 through Dec 29, 2017.  DME company is [employer] in [place] and apparently we were unable to access his information beyond May 18.  However he still  admits to 100% compliance.  On that download he was continuing to meet compliance standards.  CPAP pressure is set at 11 cm of water.  AHI is excellent at 0.7.    When I saw him in June 2019 I discussed improved sleep duration subsequently he has now been wearing CPAP all night.  Previously he used to fall asleep without it and then put it on in the middle of the night.  I last saw him in October 2019.  At that time he had improved sleep duration and was he now is using CPAP almost 8 hours per night.  A new download was obtained from April 23, 2018 through May 22, 2018.  He is 100% compliant.  At a set pressure of 11's 11 cm, AHI is excellent at 0.4.  He does not have any leak.  He has more energy.  He  is sleeping much better.  His previous 3+ nocturia has essentially vanished.  He will admits to some fatigue and being tired.  Pulse is been in the low 50s.  During that evaluation, I recommended discontinuance of diltiazem in light of his bradycardia and he is now been on amlodipine which was titrated to 7.5 mg.  This was subsequently titrated further to 10 mg.  He had seen Jory Sims in late December 2019 following an emergency room evaluation of August 03, 2018.  During his ER evaluation he had complaints of palpitations with vague chest pain.  His ECG showed sinus rhythm with ventricular trigeminy and prolonged PR interval.  He was started on Imdur but stopped taking the Imdur secondary to development of significant headache.  He underwent a Zio patch monitor for 2 weeks.  This showed predominant sinus rhythm with average heart rate 67 bpm.  There was one episode of supraventricular tachycardia which lasted 4 beats at a rate of 139.  He had isolated PACs.  There were rare atrial couplets.  He had isolated PVCs which were frequent with rare episodes of ventricular couplet and one ventricular triplet but had some episodes of ventricular bigeminy and trigeminy.   Since I last saw him in February  2020, he admits to feeling well.  He denies any exertional anginal symptomatology but admits to a vague nonexertional chest wall sensation rarely.  He has continued to be on carvedilol 25 mg twice a day, amlodipine 10 mg and losartan 100 mg for hypertension.  He is diabetic on metformin.  He is on Zetia and fenofibrate for his mixed hyperlipidemia.  Laboratory on April 16, 2019 showed a total cholesterol 119 LDL cholesterol 58 but triglycerides although improved was still elevated at 215 with HDL of 26.  He continues to use CPAP with 1/2% compliance.  A download was obtained in the office today from August 12 through April 24, 2019 which confirms 100% compliance with average usage at 7 hours and 6 minutes.  He is set at 11 cm water pressure and AHI is excellent at 0.7.  He presents for evaluation.  Past Medical History:  Diagnosis Date  . Diabetes mellitus without complication ([place])   . History of hiatal hernia   . Hyperlipidemia   . Hypertension   . S/P CABG x 3 01/04/2017   LIMA to LAD, Sequential SVG to D1-D2, EVH via right thigh    Past Surgical History:  Procedure Laterality Date  . CORONARY ARTERY BYPASS GRAFT N/A 01/04/2017   Procedure: CORONARY ARTERY BYPASS GRAFTING (CABG)X3, ON PUMP, USING LEFT INTERNAL MAMMARY ARTERY AND RIGHT GREATER SAPHENOUS VEIN HARVESTED ENDOSCOPICALLY, LIMA-LAD, SEQ SVG- DIAG 1 -DIAG 2;  Surgeon: [name], MD;  Location: [facility];  Service: Open Heart Surgery;  Laterality: N/A;  . KNEE ARTHROSCOPY Bilateral   . LEFT HEART CATH AND CORONARY ANGIOGRAPHY N/A 01/02/2017   Procedure: Left Heart Cath and Coronary Angiography;  Surgeon: [name], MD;  Location: [facility];  Service: Cardiovascular;  Laterality: N/A;  . TEE WITHOUT CARDIOVERSION N/A 01/04/2017   Procedure: TRANSESOPHAGEAL ECHOCARDIOGRAM (TEE);  Surgeon: [name], MD;  Location: [facility];  Service: Open Heart Surgery;  Laterality: N/A;    Allergies  Allergen Reactions  .  Jardiance [Empagliflozin] Shortness Of Breath, Rash and Cough  . Lisinopril Cough  . Statins     Muscle pain     Current Outpatient Medications  Medication Sig Dispense Refill  . acetaminophen (TYLENOL) 650  MG CR tablet Take 650 mg by mouth every 8 (eight) hours as needed for pain.    . Alogliptin Benzoate 25 MG TABS Take 1 tablet by mouth every morning.    [name] amLODipine (NORVASC) 10 MG tablet Take 10 mg by mouth daily.    [name] Apoaequorin (PREVAGEN PO) Take 1 tablet by mouth daily.    [name] Apple Cider Vinegar 500 MG TABS Take 3 tablets by mouth daily.    [name] aspirin EC 81 MG EC tablet Take 1 tablet (81 mg total) by mouth daily.    . carvedilol (COREG) 25 MG tablet Take 1.5 tablet (37.5) in the AM, and 1 tablet (25 mg) in the PM 135 tablet 3  . cetirizine (ZYRTEC) 10 MG tablet Take 10 mg by mouth daily as needed for allergies.     . Cholecalciferol (VITAMIN D-3) 5000 UNITS TABS Take 10,000 Units by mouth every morning.     [name] COPPER PO Take 1 capsule by mouth daily.    . fenofibrate (TRICOR) 145 MG tablet Take 1 tablet (145 mg total) by mouth daily. (Patient taking differently: Take 145 mg by mouth every evening. ) 90 tablet 3  . fluticasone (FLONASE) 50 MCG/ACT nasal spray Place 1 spray into both nostrils daily as needed (seasonal allergies).     . L-Glutathione CRYS Take 500 mg by mouth daily.    [name] losartan (COZAAR) 100 MG tablet Take 1 tablet (100 mg total) by mouth daily. (Patient taking differently: Take 100 mg by mouth every morning. ) 90 tablet 3  . magnesium oxide (MAG-OX) 400 MG tablet Take 400 mg by mouth every morning.    . metFORMIN (GLUCOPHAGE) 850 MG tablet Take 850 mg by mouth 2 (two) times daily with a meal.     . Misc Natural Products (TURMERIC CURCUMIN) CAPS Take by mouth 2 (two) times daily.    . Multiple Vitamin (MULTIVITAMIN WITH MINERALS) TABS tablet Take 2 tablets by mouth every morning.     . NON FORMULARY Take 2 capsules by mouth daily. Amazin Grape: MUscadine Grapeseed/Skin     . Omega-3 Fatty Acids (OMEGA-3 FISH OIL PO) Take 2 capsules by mouth daily.    [name] OVER THE COUNTER MEDICATION daily. Blood boost    . pantoprazole (PROTONIX) 40 MG tablet Take 1 tablet by mouth every morning.   6  . zinc gluconate 50 MG tablet Take 50 mg by mouth daily.    [name] ezetimibe (ZETIA) 10 MG tablet Take 1 tablet (10 mg total) by mouth daily. 90 tablet 3   No current facility-administered medications for this visit.     Social history is notable he is married for 46 years. He has 2 children and 3 grandchildren. 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 There is no history of tobacco use. There is no alcohol use.  Family History  Problem Relation Age of Onset  . Diabetes Mother   . Cancer - Prostate Father   . Rheum arthritis Sister   . Diabetes Brother   . Arthritis Brother   . Cancer - Other Brother        pancreatic  . Rheum arthritis Brother   . Diabetes type II Son    ROS General: Negative; No fevers, chills, or night sweats;  HEENT: Negative; No changes in vision or hearing, sinus congestion, difficulty swallowing Pulmonary: Negative; No cough, wheezing, shortness of breath, hemoptysis Cardiovascular:  Occasional atypical chest pain; No presyncope, syncope, palpitations GI: Positive for hiatal hernia; No nausea, vomiting, diarrhea, or abdominal pain GU: Negative; No dysuria, hematuria, or difficulty voiding Musculoskeletal: Negative; no myalgias, joint pain, or weakness Hematologic/Oncology: Negative; no easy bruising, bleeding Endocrine: Positive for diabetes; no heat/cold intolerance; Neuro: Negative; no changes in balance, headaches Skin: Negative; No rashes or skin lesions Psychiatric: Negative; No behavioral problems, depression Sleep: since starting CPAP therapy no snoring, daytime sleepiness, hypersomnolence, bruxism, restless legs, hypnogognic hallucinations, no  cataplexy Other comprehensive 14 point system review is negative.   PE BP 140/64   Pulse 62   Ht _0  (1.88 m)   Wt 246 lb 12.8 oz (111.9 kg)   BMI 31.69 kg/m    Repeat blood pressure by me was 135/66  Wt Readings from Last 3 Encounters:  04/29/19 246 lb 12.8 oz (111.9 kg)  09/24/18 245 lb (111.1 kg)  08/12/18 243 lb (110.2 kg)   General: Alert, oriented, no distress.  Skin: normal turgor, no rashes, warm and dry HEENT: Normocephalic, atraumatic. Pupils equal round and reactive to light; sclera anicteric; extraocular muscles intact;  Nose without nasal septal hypertrophy Mouth/Parynx benign; Mallinpatti scale 3 Neck: No JVD, no carotid bruits; normal carotid upstroke Lungs: clear to ausculatation and percussion; no wheezing or rales Chest wall: without tenderness to palpitation Heart: PMI not displaced, RRR, s1 s2 normal, 1/6 systolic murmur, no diastolic murmur, no rubs, gallops, thrills, or heaves Abdomen: soft, nontender; no hepatosplenomehaly, BS+; abdominal aorta nontender and not dilated by palpation. Back: no CVA tenderness Pulses 2+ Musculoskeletal: full range of motion, normal strength, no joint deformities Extremities: no clubbing cyanosis or edema, Homan's sign negative  Neurologic: grossly nonfocal; Cranial nerves grossly wnl Psychologic: Normal mood and affect   ECG (independently read by me): Normal sinus rhythm at 62 bpm.  Borderline first-degree AV block with a period of 206 ms.  No ectopy.  09/24/2018 ECG (independently read by me): Sinus rhythm at 66 bpm.  Q wave in lead III and aVF.  Peroneal 194 ms.  QTc interval 400 ms.  May 24, 2018 ECG (independently read by me): Sinus bradycardia 50 bpm.  Borderline first-degree AV block with a PR interval of 206 ms.  Poor anterior R wave progression.  No ectopy.  April 11, 2018 ECG (independently read by me): Sinus bradycardia at 47 bpm.  First-degree AV block.  PR interval 228 ms.  May 2019 ECG  (independently read by me): Sinus bradycardia 52 bpm with mild sinus arrhythmia.  First-degree AV block with a PR interval at 218 ms.  QS complex V1 V2.  September 2018 ECG (independently read by me): Sinus rhythm at 60 bpm with first-degree AV block.  Small Q wave in lead 3.  Nonspecific T changes.  PR interval 220 ms.  May 2018 ECG (independently read by me): Sinus bradycardia 52 bpm.  Q wave in lead 3.  Normal intervals  January 2017 ECG (independently read by me):  Normal sinus rhythm at 62 with an isolated PVC.  QTC 410 ms.  First degree AV block with a PR interval 226.  Previous ECG from 09/15/13 (independently read by me): Sinus rhythm at 67 beats per minute with borderline first-degree block with PR interval 212 ms.   LABS:  BMP Latest Ref Rng & Units 08/03/2018 07/12/2018 01/09/2017  Glucose 70 - 99 mg/dL 93 132(H) 136(H)  BUN 8 - 23 mg/dL _1 Creatinine 0.61 - 1.24 mg/dL 1.09 1.19 0.78  BUN/Creat  Ratio 10 - 24 - 18 -  Sodium 135 - 145 mmol/L 137 137 136  Potassium 3.5 - 5.1 mmol/L 4.0 4.7 3.6  Chloride 98 - 111 mmol/L 104 99 101  CO2 22 - 32 mmol/L _0 Calcium 8.9 - 10.3 mg/dL 9.5 10.0 8.9   Hepatic Function Latest Ref Rng & Units 04/16/2019 08/13/2018 07/12/2018  Total Protein 6.0 - 8.5 g/dL 6.7 6.6 6.9  Albumin 3.7 - 4.7 g/dL 4.3 4.2 4.5  AST 0 - 40 IU/L _1 ALT 0 - 44 IU/L _2 Alk Phosphatase 39 - 117 IU/L 49 61 47  Total Bilirubin 0.0 - 1.2 mg/dL 0.3 <0.2 0.4  Bilirubin, Direct 0.00 - 0.40 mg/dL 0.14 0.12 -   CBC Latest Ref Rng & Units 08/03/2018 01/09/2017 01/08/2017  WBC 4.0 - 10.5 K/uL 4.9 5.7 7.4  Hemoglobin 13.0 - 17.0 g/dL 12.8(L) 9.9(L) 9.3(L)  Hematocrit 39.0 - 52.0 % 41.0 30.4(L) 29.0(L)  Platelets 150 - 400 K/uL 241 268 212   Lab Results  Component Value Date   MCV 94.5 08/03/2018   MCV 91.8 01/09/2017   MCV 92.4 01/08/2017   Lab Results  Component Value Date   TSH 2.850 08/27/2015   Lab Results  Component Value Date   HGBA1C  7.2 (H) 01/03/2017   Lipid Panel     Component Value Date/Time   CHOL 119 04/16/2019 0905   TRIG 215 (H) 04/16/2019 0905   HDL 26 (L) 04/16/2019 0905   CHOLHDL 4.6 04/16/2019 0905   CHOLHDL 4.5 01/01/2017 0042   VLDL UNABLE TO CALCULATE IF TRIGLYCERIDE OVER 400 mg/dL 01/01/2017 0042   LDLCALC 29 08/13/2018 1112     RADIOLOGY: No results found.  IMPRESSION:  1. OSA on CPAP   2. Coronary artery disease involving native coronary artery of native heart without angina pectoris   3. Hx of CABG   4. Hyperlipidemia with target LDL less than 70   5. Mild obesity   6. Mixed hyperlipidemia     ASSESSMENT AND PLAN: Mr. Carranza is a 73 year old African American male who has a history of long-standing  hypertension and diabetes mellitus. He has a family history of CAD with his mother dying from myocardial infarction and who also diabetes mellitus at age 70.  A prior nuclear study had revealed normal perfusion and  An echo Doppler study  revealed normal systolic function without regional wall motion abnormalities.  There was grade 2 diastolic dysfunction and to tissue Doppler was abnormal  suggesting elevation of ventricular filling pressures.  He developed new onset unstable angina leading to urgent catheterization on 01/02/2017 and  was found to have a complex bifurcation stenosis with a [place] 011 lesion.  He ultimately underwent CABG surgery with LIMA to his LAD and vein grafts to 2 diagonal vessels.  Presently, he denies any recurrent anginal symptomatology since his CABG revascularization.  He subsequently developed bradycardia  and I reduced his diltiazem down to 240 mg.  When he was last seen apparently he inadvertently was taking 2 calcium channel blockers and I discontinued diltiazem.  Subsequently, his amlodipine dose was titrated to 7.5 mg and later to 10 mg for more optimal blood pressure benefit.  He was evaluated in the emergency room with palpitations.  On a ZIO patch monitor he was  noted to have frequent PVCs with a very rare couplet and one triplet and had one episode of 4 beats of SVT but otherwise heart rate was  averaging at 67.  When I last saw him I suggested slight additional titration of his a.m. carvedilol.  However he has not been doing this and consists continues to take his prior dose of 25 mg twice a day.  He denies any recent ectopy or awareness of any palpitations.  His blood pressure today is minimally increased at 135/66.  I reviewed recent laboratory.  LDL cholesterol is excellent at 58 I suspect in light of his atherogenic dyslipidemic pattern he most likely has small LDL particles.  Triglycerides are improved but remains slightly elevated at 215.  VA LDL is now normal at 35 although HDL remains low at 26.  He continues to be on Zetia in addition to fenofibrate.  When last evaluated I recommended the addition of the Cipro 2 capsules twice a day I again discussed the importance of improved diet with reduction of sweets, carbohydrates.  We discussed increased activity.  He continues to use CPAP with 100% compliance and his AHI is excellent at 0.7.  He feels well and is unaware of any breakthrough snoring.  A new Epworth scale endorsed at 2 arguing against residual daytime sleepiness.  I will see him in 6 months for reevaluation or sooner if problems arise.  Time spent: 25 minutes  Troy Sine, MD, [facility] 05/01/2019 4:59 PM

## 2019-05-01 ENCOUNTER — Encounter: Payer: Self-pay | Admitting: Cardiovascular Disease

## 2019-05-02 DIAGNOSIS — R5383 Other fatigue: Secondary | ICD-10-CM | POA: Diagnosis not present

## 2019-05-02 DIAGNOSIS — E1165 Type 2 diabetes mellitus with hyperglycemia: Secondary | ICD-10-CM | POA: Diagnosis not present

## 2019-05-02 DIAGNOSIS — I2510 Atherosclerotic heart disease of native coronary artery without angina pectoris: Secondary | ICD-10-CM | POA: Diagnosis not present

## 2019-05-02 DIAGNOSIS — Z299 Encounter for prophylactic measures, unspecified: Secondary | ICD-10-CM | POA: Diagnosis not present

## 2019-05-02 DIAGNOSIS — I10 Essential (primary) hypertension: Secondary | ICD-10-CM | POA: Diagnosis not present

## 2019-05-02 DIAGNOSIS — Z6832 Body mass index (BMI) 32.0-32.9, adult: Secondary | ICD-10-CM | POA: Diagnosis not present

## 2019-05-19 DIAGNOSIS — S338XXA Sprain of other parts of lumbar spine and pelvis, initial encounter: Secondary | ICD-10-CM | POA: Diagnosis not present

## 2019-05-19 DIAGNOSIS — M9901 Segmental and somatic dysfunction of cervical region: Secondary | ICD-10-CM | POA: Diagnosis not present

## 2019-05-19 DIAGNOSIS — S134XXA Sprain of ligaments of cervical spine, initial encounter: Secondary | ICD-10-CM | POA: Diagnosis not present

## 2019-05-19 DIAGNOSIS — S233XXA Sprain of ligaments of thoracic spine, initial encounter: Secondary | ICD-10-CM | POA: Diagnosis not present

## 2019-05-19 DIAGNOSIS — M9903 Segmental and somatic dysfunction of lumbar region: Secondary | ICD-10-CM | POA: Diagnosis not present

## 2019-05-19 DIAGNOSIS — M9902 Segmental and somatic dysfunction of thoracic region: Secondary | ICD-10-CM | POA: Diagnosis not present

## 2019-06-03 DIAGNOSIS — I10 Essential (primary) hypertension: Secondary | ICD-10-CM | POA: Diagnosis not present

## 2019-06-03 DIAGNOSIS — E119 Type 2 diabetes mellitus without complications: Secondary | ICD-10-CM | POA: Diagnosis not present

## 2019-06-03 DIAGNOSIS — E7800 Pure hypercholesterolemia, unspecified: Secondary | ICD-10-CM | POA: Diagnosis not present

## 2019-06-04 DIAGNOSIS — L609 Nail disorder, unspecified: Secondary | ICD-10-CM | POA: Diagnosis not present

## 2019-06-04 DIAGNOSIS — L110 Acquired keratosis follicularis: Secondary | ICD-10-CM | POA: Diagnosis not present

## 2019-06-04 DIAGNOSIS — E1140 Type 2 diabetes mellitus with diabetic neuropathy, unspecified: Secondary | ICD-10-CM | POA: Diagnosis not present

## 2019-06-06 DIAGNOSIS — Z23 Encounter for immunization: Secondary | ICD-10-CM | POA: Diagnosis not present

## 2019-06-23 DIAGNOSIS — H9319 Tinnitus, unspecified ear: Secondary | ICD-10-CM | POA: Diagnosis not present

## 2019-06-23 DIAGNOSIS — H903 Sensorineural hearing loss, bilateral: Secondary | ICD-10-CM | POA: Diagnosis not present

## 2019-06-23 DIAGNOSIS — H9313 Tinnitus, bilateral: Secondary | ICD-10-CM | POA: Diagnosis not present

## 2019-06-27 ENCOUNTER — Other Ambulatory Visit: Payer: Self-pay

## 2019-06-27 MED ORDER — EZETIMIBE 10 MG PO TABS: 10.0000 mg | ORAL_TABLET | Freq: Every day | ORAL | 3 refills | Status: DC

## 2019-08-14 DIAGNOSIS — E7800 Pure hypercholesterolemia, unspecified: Secondary | ICD-10-CM | POA: Diagnosis not present

## 2019-08-14 DIAGNOSIS — E119 Type 2 diabetes mellitus without complications: Secondary | ICD-10-CM | POA: Diagnosis not present

## 2019-08-14 DIAGNOSIS — I10 Essential (primary) hypertension: Secondary | ICD-10-CM | POA: Diagnosis not present

## 2019-08-18 DIAGNOSIS — R002 Palpitations: Secondary | ICD-10-CM | POA: Diagnosis not present

## 2019-08-22 DIAGNOSIS — R0789 Other chest pain: Secondary | ICD-10-CM | POA: Diagnosis not present

## 2019-08-22 DIAGNOSIS — K219 Gastro-esophageal reflux disease without esophagitis: Secondary | ICD-10-CM | POA: Diagnosis not present

## 2019-08-22 DIAGNOSIS — R1310 Dysphagia, unspecified: Secondary | ICD-10-CM | POA: Diagnosis not present

## 2019-08-22 DIAGNOSIS — Z1211 Encounter for screening for malignant neoplasm of colon: Secondary | ICD-10-CM | POA: Diagnosis not present

## 2019-08-22 DIAGNOSIS — R1013 Epigastric pain: Secondary | ICD-10-CM | POA: Diagnosis not present

## 2019-08-22 DIAGNOSIS — K449 Diaphragmatic hernia without obstruction or gangrene: Secondary | ICD-10-CM | POA: Diagnosis not present

## 2019-08-26 DIAGNOSIS — I10 Essential (primary) hypertension: Secondary | ICD-10-CM | POA: Diagnosis not present

## 2019-08-26 DIAGNOSIS — Z789 Other specified health status: Secondary | ICD-10-CM | POA: Diagnosis not present

## 2019-08-26 DIAGNOSIS — E1165 Type 2 diabetes mellitus with hyperglycemia: Secondary | ICD-10-CM | POA: Diagnosis not present

## 2019-08-26 DIAGNOSIS — I472 Ventricular tachycardia: Secondary | ICD-10-CM | POA: Diagnosis not present

## 2019-08-26 DIAGNOSIS — Z299 Encounter for prophylactic measures, unspecified: Secondary | ICD-10-CM | POA: Diagnosis not present

## 2019-08-26 DIAGNOSIS — G4730 Sleep apnea, unspecified: Secondary | ICD-10-CM | POA: Diagnosis not present

## 2019-08-26 DIAGNOSIS — Z6832 Body mass index (BMI) 32.0-32.9, adult: Secondary | ICD-10-CM | POA: Diagnosis not present

## 2019-08-27 DIAGNOSIS — L609 Nail disorder, unspecified: Secondary | ICD-10-CM | POA: Diagnosis not present

## 2019-08-27 DIAGNOSIS — L110 Acquired keratosis follicularis: Secondary | ICD-10-CM | POA: Diagnosis not present

## 2019-08-27 DIAGNOSIS — E1140 Type 2 diabetes mellitus with diabetic neuropathy, unspecified: Secondary | ICD-10-CM | POA: Diagnosis not present

## 2019-09-08 DIAGNOSIS — R1013 Epigastric pain: Secondary | ICD-10-CM | POA: Diagnosis not present

## 2019-09-08 DIAGNOSIS — R0789 Other chest pain: Secondary | ICD-10-CM | POA: Diagnosis not present

## 2019-09-08 DIAGNOSIS — R1310 Dysphagia, unspecified: Secondary | ICD-10-CM | POA: Diagnosis not present

## 2019-09-11 DIAGNOSIS — I10 Essential (primary) hypertension: Secondary | ICD-10-CM | POA: Diagnosis not present

## 2019-09-11 DIAGNOSIS — E119 Type 2 diabetes mellitus without complications: Secondary | ICD-10-CM | POA: Diagnosis not present

## 2019-09-11 DIAGNOSIS — E7800 Pure hypercholesterolemia, unspecified: Secondary | ICD-10-CM | POA: Diagnosis not present

## 2019-09-23 DIAGNOSIS — Z1211 Encounter for screening for malignant neoplasm of colon: Secondary | ICD-10-CM | POA: Diagnosis not present

## 2019-09-23 DIAGNOSIS — K219 Gastro-esophageal reflux disease without esophagitis: Secondary | ICD-10-CM | POA: Diagnosis not present

## 2019-09-23 DIAGNOSIS — R1013 Epigastric pain: Secondary | ICD-10-CM | POA: Diagnosis not present

## 2019-09-23 DIAGNOSIS — K449 Diaphragmatic hernia without obstruction or gangrene: Secondary | ICD-10-CM | POA: Diagnosis not present

## 2019-09-23 DIAGNOSIS — R1310 Dysphagia, unspecified: Secondary | ICD-10-CM | POA: Diagnosis not present

## 2019-09-25 ENCOUNTER — Other Ambulatory Visit: Payer: Self-pay | Admitting: Cardiovascular Disease

## 2019-10-07 DIAGNOSIS — R0789 Other chest pain: Secondary | ICD-10-CM | POA: Diagnosis not present

## 2019-10-07 DIAGNOSIS — I472 Ventricular tachycardia: Secondary | ICD-10-CM | POA: Diagnosis not present

## 2019-10-07 DIAGNOSIS — E1165 Type 2 diabetes mellitus with hyperglycemia: Secondary | ICD-10-CM | POA: Diagnosis not present

## 2019-10-07 DIAGNOSIS — Z6832 Body mass index (BMI) 32.0-32.9, adult: Secondary | ICD-10-CM | POA: Diagnosis not present

## 2019-10-07 DIAGNOSIS — R079 Chest pain, unspecified: Secondary | ICD-10-CM | POA: Diagnosis not present

## 2019-10-07 DIAGNOSIS — I10 Essential (primary) hypertension: Secondary | ICD-10-CM | POA: Diagnosis not present

## 2019-10-07 DIAGNOSIS — Z299 Encounter for prophylactic measures, unspecified: Secondary | ICD-10-CM | POA: Diagnosis not present

## 2019-10-07 DIAGNOSIS — Z789 Other specified health status: Secondary | ICD-10-CM | POA: Diagnosis not present

## 2019-10-08 ENCOUNTER — Telehealth: Payer: Self-pay | Admitting: Cardiovascular Disease

## 2019-10-08 DIAGNOSIS — Z79899 Other long term (current) drug therapy: Secondary | ICD-10-CM

## 2019-10-08 DIAGNOSIS — I493 Ventricular premature depolarization: Secondary | ICD-10-CM

## 2019-10-08 DIAGNOSIS — I2510 Atherosclerotic heart disease of native coronary artery without angina pectoris: Secondary | ICD-10-CM

## 2019-10-08 DIAGNOSIS — R002 Palpitations: Secondary | ICD-10-CM

## 2019-10-08 DIAGNOSIS — E785 Hyperlipidemia, unspecified: Secondary | ICD-10-CM

## 2019-10-08 DIAGNOSIS — I10 Essential (primary) hypertension: Secondary | ICD-10-CM

## 2019-10-08 DIAGNOSIS — I25810 Atherosclerosis of coronary artery bypass graft(s) without angina pectoris: Secondary | ICD-10-CM

## 2019-10-08 NOTE — Telephone Encounter (Signed)
Spoke with pt who report he has been experiencing more PVC's lately and it make him feel tired and SOB. He report he attached an EKG to mychart for MD to review.   Pt has and appointment 3/18 and inquiring if MD feels he need to be seen sooner.

## 2019-10-08 NOTE — Telephone Encounter (Signed)
Patient saw his PCP today who encouraged him to reach out to [name]. He states his PVC has been acting up, he feels more discomfort and tired. Nothing alarming, just wanted to let [name] know. He stated he is sending a Pharmacist, community message as well and attaching an EKG to it.

## 2019-10-14 DIAGNOSIS — I10 Essential (primary) hypertension: Secondary | ICD-10-CM | POA: Diagnosis not present

## 2019-10-14 DIAGNOSIS — E119 Type 2 diabetes mellitus without complications: Secondary | ICD-10-CM | POA: Diagnosis not present

## 2019-10-14 DIAGNOSIS — E7800 Pure hypercholesterolemia, unspecified: Secondary | ICD-10-CM | POA: Diagnosis not present

## 2019-10-14 NOTE — Telephone Encounter (Signed)
Spoke with pt, notified of Dr.Kelly's recommendation "Isolated PVCs; okay to keep appointment as scheduled. Recheck lipid, cmet, tsh, cbc, and mag prior to appointment." Reminded pt of his appt on 10/30/19 at 2:20 pm. Notified that the lab slips will be mailed and that he can go to his nearest [facility] to have them drawn. Also reminded that they are fasting labs, so no food or drink after midnight. Pt verbalized understanding and had no other questions or concerns at this time. -- will place orders and mail lab slips.

## 2019-10-14 NOTE — Telephone Encounter (Signed)
Lipid, cmet, tsh, cbc, mag

## 2019-10-14 NOTE — Telephone Encounter (Signed)
Orders for lipid, cmet, tsh, cbc, and mag placed.

## 2019-10-14 NOTE — Telephone Encounter (Signed)
Isolated PVCs;  ok to keep appointment as scheduled.Recheck labs prior to visit

## 2019-10-22 DIAGNOSIS — M9903 Segmental and somatic dysfunction of lumbar region: Secondary | ICD-10-CM | POA: Diagnosis not present

## 2019-10-22 DIAGNOSIS — S233XXA Sprain of ligaments of thoracic spine, initial encounter: Secondary | ICD-10-CM | POA: Diagnosis not present

## 2019-10-22 DIAGNOSIS — S134XXA Sprain of ligaments of cervical spine, initial encounter: Secondary | ICD-10-CM | POA: Diagnosis not present

## 2019-10-22 DIAGNOSIS — M9902 Segmental and somatic dysfunction of thoracic region: Secondary | ICD-10-CM | POA: Diagnosis not present

## 2019-10-22 DIAGNOSIS — M47816 Spondylosis without myelopathy or radiculopathy, lumbar region: Secondary | ICD-10-CM | POA: Diagnosis not present

## 2019-10-22 DIAGNOSIS — M9901 Segmental and somatic dysfunction of cervical region: Secondary | ICD-10-CM | POA: Diagnosis not present

## 2019-10-27 DIAGNOSIS — Z79899 Other long term (current) drug therapy: Secondary | ICD-10-CM | POA: Diagnosis not present

## 2019-10-27 DIAGNOSIS — I493 Ventricular premature depolarization: Secondary | ICD-10-CM | POA: Diagnosis not present

## 2019-10-27 DIAGNOSIS — E785 Hyperlipidemia, unspecified: Secondary | ICD-10-CM | POA: Diagnosis not present

## 2019-10-27 DIAGNOSIS — I10 Essential (primary) hypertension: Secondary | ICD-10-CM | POA: Diagnosis not present

## 2019-10-27 DIAGNOSIS — I25810 Atherosclerosis of coronary artery bypass graft(s) without angina pectoris: Secondary | ICD-10-CM | POA: Diagnosis not present

## 2019-10-27 DIAGNOSIS — I2510 Atherosclerotic heart disease of native coronary artery without angina pectoris: Secondary | ICD-10-CM | POA: Diagnosis not present

## 2019-10-27 DIAGNOSIS — R002 Palpitations: Secondary | ICD-10-CM | POA: Diagnosis not present

## 2019-10-28 DIAGNOSIS — M9901 Segmental and somatic dysfunction of cervical region: Secondary | ICD-10-CM | POA: Diagnosis not present

## 2019-10-28 DIAGNOSIS — S134XXA Sprain of ligaments of cervical spine, initial encounter: Secondary | ICD-10-CM | POA: Diagnosis not present

## 2019-10-28 DIAGNOSIS — M47816 Spondylosis without myelopathy or radiculopathy, lumbar region: Secondary | ICD-10-CM | POA: Diagnosis not present

## 2019-10-28 DIAGNOSIS — S233XXA Sprain of ligaments of thoracic spine, initial encounter: Secondary | ICD-10-CM | POA: Diagnosis not present

## 2019-10-28 DIAGNOSIS — M9903 Segmental and somatic dysfunction of lumbar region: Secondary | ICD-10-CM | POA: Diagnosis not present

## 2019-10-28 DIAGNOSIS — M9902 Segmental and somatic dysfunction of thoracic region: Secondary | ICD-10-CM | POA: Diagnosis not present

## 2019-10-28 LAB — COMPREHENSIVE METABOLIC PANEL
ALT: 11 IU/L (ref 0–44)
AST: 18 IU/L (ref 0–40)
Albumin/Globulin Ratio: 1.8 (ref 1.2–2.2)
Albumin: 4.4 g/dL (ref 3.7–4.7)
Alkaline Phosphatase: 69 IU/L (ref 39–117)
BUN/Creatinine Ratio: 17 (ref 10–24)
BUN: 18 mg/dL (ref 8–27)
Bilirubin Total: 0.3 mg/dL (ref 0.0–1.2)
CO2: 20 mmol/L (ref 20–29)
Calcium: 9.7 mg/dL (ref 8.6–10.2)
Chloride: 103 mmol/L (ref 96–106)
Creatinine, Ser: 1.06 mg/dL (ref 0.76–1.27)
GFR calc Af Amer: 81 mL/min/{1.73_m2} (ref >59)
GFR calc non Af Amer: 70 mL/min/{1.73_m2} (ref >59)
Globulin, Total: 2.4 g/dL (ref 1.5–4.5)
Glucose: 135 mg/dL — ABNORMAL HIGH (ref 65–99)
Potassium: 4.7 mmol/L (ref 3.5–5.2)
Sodium: 138 mmol/L (ref 134–144)
Total Protein: 6.8 g/dL (ref 6.0–8.5)

## 2019-10-28 LAB — CBC
Hematocrit: 39.5 % (ref 37.5–51.0)
Hemoglobin: 12.9 g/dL — ABNORMAL LOW (ref 13.0–17.7)
MCH: 30.4 pg (ref 26.6–33.0)
MCHC: 32.7 g/dL (ref 31.5–35.7)
MCV: 93 fL (ref 79–97)
Platelets: 247 10*3/uL (ref 150–450)
RBC: 4.24 x10E6/uL (ref 4.14–5.80)
RDW: 13.1 % (ref 11.6–15.4)
WBC: 6.9 10*3/uL (ref 3.4–10.8)

## 2019-10-28 LAB — LIPID PANEL
Chol/HDL Ratio: 4 ratio (ref 0.0–5.0)
Cholesterol, Total: 101 mg/dL (ref 100–199)
HDL: 25 mg/dL — ABNORMAL LOW (ref >39)
LDL Chol Calc (NIH): 46 mg/dL (ref 0–99)
Triglycerides: 181 mg/dL — ABNORMAL HIGH (ref 0–149)
VLDL Cholesterol Cal: 30 mg/dL (ref 5–40)

## 2019-10-28 LAB — TSH: TSH: 2.58 u[IU]/mL (ref 0.450–4.500)

## 2019-10-28 LAB — MAGNESIUM: Magnesium: 1.8 mg/dL (ref 1.6–2.3)

## 2019-10-30 ENCOUNTER — Ambulatory Visit (INDEPENDENT_AMBULATORY_CARE_PROVIDER_SITE_OTHER): Payer: Medicare Other | Admitting: Cardiovascular Disease

## 2019-10-30 ENCOUNTER — Encounter: Payer: Self-pay | Admitting: Cardiovascular Disease

## 2019-10-30 ENCOUNTER — Other Ambulatory Visit: Payer: Self-pay

## 2019-10-30 VITALS — BP 140/62 | HR 59 | Ht 74.0 in | Wt 248.0 lb

## 2019-10-30 DIAGNOSIS — E782 Mixed hyperlipidemia: Secondary | ICD-10-CM | POA: Diagnosis not present

## 2019-10-30 DIAGNOSIS — G4733 Obstructive sleep apnea (adult) (pediatric): Secondary | ICD-10-CM

## 2019-10-30 DIAGNOSIS — K219 Gastro-esophageal reflux disease without esophagitis: Secondary | ICD-10-CM | POA: Diagnosis not present

## 2019-10-30 DIAGNOSIS — Z9989 Dependence on other enabling machines and devices: Secondary | ICD-10-CM | POA: Diagnosis not present

## 2019-10-30 DIAGNOSIS — I2510 Atherosclerotic heart disease of native coronary artery without angina pectoris: Secondary | ICD-10-CM

## 2019-10-30 DIAGNOSIS — Z951 Presence of aortocoronary bypass graft: Secondary | ICD-10-CM

## 2019-10-30 DIAGNOSIS — I10 Essential (primary) hypertension: Secondary | ICD-10-CM | POA: Diagnosis not present

## 2019-10-30 DIAGNOSIS — I25810 Atherosclerosis of coronary artery bypass graft(s) without angina pectoris: Secondary | ICD-10-CM | POA: Diagnosis not present

## 2019-10-30 NOTE — Progress Notes (Signed)
Patient ID: William Orr, male   DOB: 09/02/1945, 74 y.o.   MRN: [id number]    Primary M.D.: Dr.A Lora Havens  HPI: William Orr is a 74 year old African American male who presents to the office for a 6 month follow-up cardiology/sleep evaluation.  [name]  has a long history of hypertension,  type 2 diabetes mellitus, and hyperlipidemia. He  underwent an exercise nuclear study in [place] for atypical chest pain which revealed normal perfusion with an ejection fraction of 61%. There was no evidence for ischemia and he had normal wall motion. He was hypertensive throughout the study.  He has been maintained on blood pressure medicines consisting of diltiazem 300 mg daily, carvedilol 25 mg twice a day, and losartan HCT 100/25 mg. He previously, his had taken simvastatin 20 mg for hyperlipidemia  but self discontinued this.  Recently, he has been taking over-the-counter supplements with fair amount of omega-3 fatty acids for elevated triglycerides.[name] He takes Glucophage 850 mg in the morning and 2 tablets at night.   He has had some instances of atypical, probable musculoskeletal chest pain which improved with nonsteroidal therapy.  He underwent a 2-D echo Doppler study on 09/25/2013 and this revealed an ejection fraction of 60-65% with mild focal basal hypertrophy of the septum had normal diastolic parameters. There was moderate thickening of the trileaflet aortic valve consistent with aortic valve sclerosis. He had mild left atrial dilatation. His right ventricle was upper normal to mildly dilated.    Due to my concerns that he may have significant obstructive sleep apnea he underwent a diagnostic polysomnogram in March 2015 which revealed mild sleep apnea overall, with an AHI of 5.8, but he had moderate sleep apnea during REM sleep with an AHI of 25/hr.  He also had significant oxygen desaturation to 84% with non-REM sleep and 76% with REM sleep and had evidence for very loud snoring.  His  periodic limb movement index was 6.7 per hour with 9.5 per hour to arousal.  He underwent CPAP titration and 11 cm pressure was recommended.  He has a ResMed AirSense10 AutoSet.  A download from 03/11/2014 through 04/09/2014 revealed that he is meeting Medicare compliance with 100% days of the device usage.  97% of the time was used at greater than 4 hours.  With his current setting, AHI is excellent at 1.0.  His were index was 0.4.  He's using a fullface mask.  His DME company is [employer] in [place], [place].  Subjectively, since initiating therapy.  He feels markedly improved.  He denies daytime sleepiness.  He denies breakthrough snoring.  His sleep is more restorative.  He has more energy.  He denies hypersomnolence.  When I saw him in 2017, he  continued to use CPAP but occasional night he would fall asleep before putting on his CPAP unit.  He does not sleep as well most nights and oftentimes wakes up several more times to go to the bathroom. Recently, he has noticed that he may experience some increased palpitations in the morning.  He has not been able to correlate if this is related to potential night sweats.   A download was obtained from 09/27/2015 through 12/25/2015.  This showed 77% of days of use with 69% of the days greater than 4 hours.  He was averaging 5 hours and 42 minutes of CPAP use.  His AHI at 11 cm water pressure remained excellent at 0.7.    A 2-D echo Doppler study revealed an EF of 60-65%.  There was grade 2 diastolic dysfunction.  There was aortic valve sclerosis without stenosis or regurgitation.  There was suggestion that there was mildly reduced RV function.  He was admitted to [facility] with unstable angina and underwent catheterization on 01/02/2017 in the setting of a non-ST segment MI.  He was found to have multifocal high-grade disease with complex bifurcation stenosis of his proximal LAD and diagonal.  He underwent CABG revascularization surgery by Dr. [name] on  01/04/2017 and had a LIMA placed to his distal LAD, vein graft to the first diagonal vessel, and vein graft to the second diagonal vessel.  He was seen in follow-up by Almyra Deforest, PAC.  He had significant hypertriglyceridemia and was started on lovaza and due to myalgias with Crestor statin therapy was changed to simvastatin, which he has tolerated.  He completed 8 weeks of cardiac rehabilitation with his last exercise this morning.  Typically his blood pressure at cardiac rehabilitation systolically is in the 144R to 130s.  He denies any recurrent chest pain or palpitations.  He continues to use CPAP with 100% compliance with [employer] as his DME company.  He's not had a recent download.    When I saw him in September 2018.  His blood pressure was elevated on losartan 100 mg, diltiazem 3 mg, and carvedilol 25 mg twice a day.  He was taken [place] X3 omega-3 fatty acid, which included 1.125 g of EPA and to 75 mg of DHA.  Lipid studies were improved.  Triglycerides were still elevated at 340, down from 413.  Fenofibrate 145 to his medical regimen.  Over the past several months, he has felt well.  He's been without chest pain.  He uses CPAP with 100% compliance.  He denies chest pain, PND, orthopnea.  He saw Dr. Brigitte Pulse and his hemoglobin A1c was 6.0.  He is off statins due to statin intolerance.    When I  saw him in 2019 hisblood pressure was stable.  He was not having any anginal symptoms.  Is bradycardic and I reduced his low-dose. He was  continuing to use CPAP.  He was in need to undergo endoscopy scheduled for April 24, 2018 and clearance was necessary to hold his aspirin and Plavix.  He denies any recurrent anginal symptoms.  He continues to use CPAP.  A download was obtained in April 19 through Dec 29, 2017.  DME company is [employer] in [place] and apparently we were unable to access his information beyond May 18.  However he still admits to 100% compliance.  On that download he was continuing to  meet compliance standards.  CPAP pressure is set at 11 cm of water.  AHI is excellent at 0.7.    When I saw him in June 2019 I discussed improved sleep duration subsequently he has now been wearing CPAP all night.  Previously he used to fall asleep without it and then put it on in the middle of the night.  I last saw him in October 2019.  At that time he had improved sleep duration and was he now is using CPAP almost 8 hours per night.  A new download was obtained from April 23, 2018 through May 22, 2018.  He is 100% compliant.  At a set pressure of 11's 11 cm, AHI is excellent at 0.4.  He does not have any leak.  He has more energy.  He is sleeping much better.  His previous 3+ nocturia has essentially vanished.  He will  admits to some fatigue and being tired.  Pulse is been in the low 50s.  During that evaluation, I recommended discontinuance of diltiazem in light of his bradycardia and he is now been on amlodipine which was titrated to 7.5 mg.  This was subsequently titrated further to 10 mg.  He had seen Jory Sims in late December 2019 following an emergency room evaluation of August 03, 2018.  During his ER evaluation he had complaints of palpitations with vague chest pain.  His ECG showed sinus rhythm with ventricular trigeminy and prolonged PR interval.  He was started on Imdur but stopped taking the Imdur secondary to development of significant headache.  He underwent a Zio patch monitor for 2 weeks.  This showed predominant sinus rhythm with average heart rate 67 bpm.  There was one episode of supraventricular tachycardia which lasted 4 beats at a rate of 139.  He had isolated PACs.  There were rare atrial couplets.  He had isolated PVCs which were frequent with rare episodes of ventricular couplet and one ventricular triplet but had some episodes of ventricular bigeminy and trigeminy.   I last saw him in September 2020 since his prior February 2020, he continued to feel well.  He denied  exertional chest pain symptomatology but admitted to a rare nonexertional chest wall sensation. He has continued to be on carvedilol 25 mg twice a day, amlodipine 10 mg and losartan 100 mg for hypertension.  He is diabetic on metformin.  He is on Zetia and fenofibrate for his mixed hyperlipidemia.  Laboratory on April 16, 2019 showed a total cholesterol 119 LDL cholesterol 58 but triglycerides although improved was still elevated at 215 with HDL of 26.  He continues to use CPAP with 1/2% compliance.  A download was obtained in the office today from August 12 through April 24, 2019 which confirms 100% compliance with average usage at 7 hours and 6 minutes.  He is set at 11 cm water pressure and AHI is excellent at 0.7.    Since I last saw him over the past 7 months he has remained stable.  There is no angina.  At times he notes a pinching sensation at different sites on his chest wall.  He also experiences a rare sharp twinge to be breath.  He continues to use CPAP with excellent compliance and denies residual daytime sleepiness.  A download was obtained in the office today from February 16 through October 29, 2019.  Compliance is excellent with average usage 7 hours and 21 minutes.  At his 11 cm set pressure, AHI is excellent at 0.3.  There is no significant mask leak.  His DME company is [employer] in [place].  An Epworth Sleepiness Scale score was calculated in the office today this endorsed at 2 arguing against residual daytime sleepiness.  He had undergone repeat laboratory on October 27, 2019.  Lipids were improved with total cholesterol 101 and LDL cholesterol was excellent at 46.  However triglycerides remain slightly elevated at 181 with a low HDL at 25.  He presents for reevaluation.  Past Medical History:  Diagnosis Date  . Diabetes mellitus without complication ([place])   . History of hiatal hernia   . Hyperlipidemia   . Hypertension   . S/P CABG x 3 01/04/2017   LIMA to LAD, Sequential  SVG to D1-D2, EVH via right thigh    Past Surgical History:  Procedure Laterality Date  . CORONARY ARTERY BYPASS GRAFT N/A 01/04/2017   Procedure:  CORONARY ARTERY BYPASS GRAFTING (CABG)X3, ON PUMP, USING LEFT INTERNAL MAMMARY ARTERY AND RIGHT GREATER SAPHENOUS VEIN HARVESTED ENDOSCOPICALLY, LIMA-LAD, SEQ SVG- DIAG 1 -DIAG 2;  Surgeon: [name], MD;  Location: [facility];  Service: Open Heart Surgery;  Laterality: N/A;  . KNEE ARTHROSCOPY Bilateral   . LEFT HEART CATH AND CORONARY ANGIOGRAPHY N/A 01/02/2017   Procedure: Left Heart Cath and Coronary Angiography;  Surgeon: [name], MD;  Location: [facility];  Service: Cardiovascular;  Laterality: N/A;  . TEE WITHOUT CARDIOVERSION N/A 01/04/2017   Procedure: TRANSESOPHAGEAL ECHOCARDIOGRAM (TEE);  Surgeon: [name], MD;  Location: [facility];  Service: Open Heart Surgery;  Laterality: N/A;    Allergies  Allergen Reactions  . Jardiance [Empagliflozin] Shortness Of Breath, Rash and Cough  . Lisinopril Cough  . Statins     Muscle pain     Current Outpatient Medications  Medication Sig Dispense Refill  . acetaminophen (TYLENOL) 650 MG CR tablet Take 650 mg by mouth every 8 (eight) hours as needed for pain.    . Alogliptin Benzoate 25 MG TABS Take 1 tablet by mouth every morning.    [name] amLODipine (NORVASC) 10 MG tablet Take 10 mg by mouth daily.    [name] Apoaequorin (PREVAGEN PO) Take 1 tablet by mouth daily.    [name] Apple Cider Vinegar 500 MG TABS Take 3 tablets by mouth daily.    [name] aspirin EC 81 MG EC tablet Take 1 tablet (81 mg total) by mouth daily.    . carvedilol (COREG) 25 MG tablet Take 1.5 tablet (37.5) in the AM, and 1 tablet (25 mg) in the PM (Patient taking differently: Take 1.5 tablet (37.5) in the AM, and pm) 135 tablet 3  . cetirizine (ZYRTEC) 10 MG tablet Take 10 mg by mouth daily as needed for allergies.     . Cholecalciferol (VITAMIN D-3) 5000 UNITS TABS Take 10,000 Units by mouth every morning.     [name] COPPER PO  Take 1 capsule by mouth daily.    [name] ezetimibe (ZETIA) 10 MG tablet TAKE 1 TABLET EVERY DAY 90 tablet 2  . fenofibrate (TRICOR) 145 MG tablet Take 1 tablet (145 mg total) by mouth daily. (Patient taking differently: Take 145 mg by mouth every evening. ) 90 tablet 3  . fluticasone (FLONASE) 50 MCG/ACT nasal spray Place 1 spray into both nostrils daily as needed (seasonal allergies).     . L-Glutathione CRYS Take 500 mg by mouth daily.    [name] losartan (COZAAR) 100 MG tablet Take 1 tablet (100 mg total) by mouth daily. (Patient taking differently: Take 100 mg by mouth every morning. ) 90 tablet 3  . magnesium oxide (MAG-OX) 400 MG tablet Take 400 mg by mouth every morning.    . metFORMIN (GLUCOPHAGE) 850 MG tablet Take 850 mg by mouth 2 (two) times daily with a meal.     . Misc Natural Products (TURMERIC CURCUMIN) CAPS Take by mouth 2 (two) times daily.    . Multiple Vitamin (MULTIVITAMIN WITH MINERALS) TABS tablet Take 2 tablets by mouth every morning.     . NON FORMULARY Take 2 capsules by mouth daily. Amazin Grape: MUscadine Grapeseed/Skin    . Omega-3 Fatty Acids (OMEGA-3 FISH OIL PO) Take 2 capsules by mouth daily.    [name] OVER THE COUNTER MEDICATION daily. Blood boost    . pantoprazole (PROTONIX) 40 MG tablet Take 1 tablet by mouth every morning.   6  . zinc gluconate 50  MG tablet Take 50 mg by mouth daily.     No current facility-administered medications for this visit.    Social history is notable he is married for 46 years. He has 2 children and 3 grandchildren. 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 There is no history of tobacco use. There is no alcohol use.  Family History  Problem Relation Age of Onset  . Diabetes Mother   . Cancer - Prostate Father   . Rheum arthritis Sister   . Diabetes Brother   . Arthritis Brother   . Cancer - Other Brother        pancreatic  . Rheum arthritis Brother    . Diabetes type II Son    ROS General: Negative; No fevers, chills, or night sweats;  HEENT: Negative; No changes in vision or hearing, sinus congestion, difficulty swallowing Pulmonary: Negative; No cough, wheezing, shortness of breath, hemoptysis Cardiovascular: Occasional atypical chest pain; No presyncope, syncope, palpitations GI: Positive for hiatal hernia; No nausea, vomiting, diarrhea, or abdominal pain GU: Negative; No dysuria, hematuria, or difficulty voiding Musculoskeletal: Negative; no myalgias, joint pain, or weakness Hematologic/Oncology: Negative; no easy bruising, bleeding Endocrine: Positive for diabetes; no heat/cold intolerance; Neuro: Negative; no changes in balance, headaches Skin: Negative; No rashes or skin lesions Psychiatric: Negative; No behavioral problems, depression Sleep: OSA but since starting CPAP therapy no snoring, daytime sleepiness, hypersomnolence, bruxism, restless legs, hypnogognic hallucinations, no cataplexy Other comprehensive 14 point system review is negative.   PE BP 140/62   Pulse (!) 59   Ht '6\' 2"'$  (1.88 m)   Wt 248 lb (112.5 kg)   SpO2 94%   BMI 31.84 kg/m    Repeat blood pressure by me was 130/62  Wt Readings from Last 3 Encounters:  10/30/19 248 lb (112.5 kg)  04/29/19 246 lb 12.8 oz (111.9 kg)  09/24/18 245 lb (111.1 kg)   General: Alert, oriented, no distress.  Skin: normal turgor, no rashes, warm and dry HEENT: Normocephalic, atraumatic. Pupils equal round and reactive to light; sclera anicteric; extraocular muscles intact; Nose without nasal septal hypertrophy Mouth/Parynx benign; Mallinpatti scale 3 Neck: No JVD, no carotid bruits; normal carotid upstroke Lungs: clear to ausculatation and percussion; no wheezing or rales Chest wall: without tenderness to palpitation Heart: PMI not displaced, RRR, s1 s2 normal, 1/6 systolic murmur, no diastolic murmur, no rubs, gallops, thrills, or heaves Abdomen: soft, nontender; no  hepatosplenomehaly, BS+; abdominal aorta nontender and not dilated by palpation. Back: no CVA tenderness Pulses 2+ Musculoskeletal: full range of motion, normal strength, no joint deformities Extremities: no clubbing cyanosis or edema, Homan's sign negative  Neurologic: grossly nonfocal; Cranial nerves grossly wnl Psychologic: Normal mood and affect   ECG (independently read by me): Sinus bradycardia 59 bpm. PR interval 206 ms. No ectopy.    April 29, 2019  ECG (independently read by me): Normal sinus rhythm at 62 bpm.  Borderline first-degree AV block with a period of 206 ms.  No ectopy.  09/24/2018 ECG (independently read by me): Sinus rhythm at 66 bpm.  Q wave in lead III and aVF.  Peroneal 194 ms.  QTc interval 400 ms.  May 24, 2018 ECG (independently read by me): Sinus bradycardia 50 bpm.  Borderline first-degree AV block with a PR interval of 206 ms.  Poor anterior R wave progression.  No ectopy.  April 11, 2018 ECG (independently read by me): Sinus  bradycardia at 47 bpm.  First-degree AV block.  PR interval 228 ms.  May 2019 ECG (independently read by me): Sinus bradycardia 52 bpm with mild sinus arrhythmia.  First-degree AV block with a PR interval at 218 ms.  QS complex V1 V2.  September 2018 ECG (independently read by me): Sinus rhythm at 60 bpm with first-degree AV block.  Small Q wave in lead 3.  Nonspecific T changes.  PR interval 220 ms.  May 2018 ECG (independently read by me): Sinus bradycardia 52 bpm.  Q wave in lead 3.  Normal intervals  January 2017 ECG (independently read by me):  Normal sinus rhythm at 62 with an isolated PVC.  QTC 410 ms.  First degree AV block with a PR interval 226.  Previous ECG from 09/15/13 (independently read by me): Sinus rhythm at 67 beats per minute with borderline first-degree block with PR interval 212 ms.   LABS:  BMP Latest Ref Rng & Units 10/27/2019 08/03/2018 07/12/2018  Glucose 65 - 99 mg/dL 135(H) 93 132(H)  BUN 8 - 27  mg/dL '18 17 21  '$ Creatinine 0.76 - 1.27 mg/dL 1.06 1.09 1.19  BUN/Creat Ratio 10 - 24 17 - 18  Sodium 134 - 144 mmol/L 138 137 137  Potassium 3.5 - 5.2 mmol/L 4.7 4.0 4.7  Chloride 96 - 106 mmol/L 103 104 99  CO2 20 - 29 mmol/L '20 26 24  '$ Calcium 8.6 - 10.2 mg/dL 9.7 9.5 10.0   Hepatic Function Latest Ref Rng & Units 10/27/2019 04/16/2019 08/13/2018  Total Protein 6.0 - 8.5 g/dL 6.8 6.7 6.6  Albumin 3.7 - 4.7 g/dL 4.4 4.3 4.2  AST 0 - 40 IU/L '18 15 15  '$ ALT 0 - 44 IU/L '11 14 13  '$ Alk Phosphatase 39 - 117 IU/L 69 49 61  Total Bilirubin 0.0 - 1.2 mg/dL 0.3 0.3 <0.2  Bilirubin, Direct 0.00 - 0.40 mg/dL - 0.14 0.12   CBC Latest Ref Rng & Units 10/27/2019 08/03/2018 01/09/2017  WBC 3.4 - 10.8 x10E3/uL 6.9 4.9 5.7  Hemoglobin 13.0 - 17.7 g/dL 12.9(L) 12.8(L) 9.9(L)  Hematocrit 37.5 - 51.0 % 39.5 41.0 30.4(L)  Platelets 150 - 450 x10E3/uL 247 241 268   Lab Results  Component Value Date   MCV 93 10/27/2019   MCV 94.5 08/03/2018   MCV 91.8 01/09/2017   Lab Results  Component Value Date   TSH 2.580 10/27/2019   Lab Results  Component Value Date   HGBA1C 7.2 (H) 01/03/2017   Lipid Panel     Component Value Date/Time   CHOL 101 10/27/2019 1052   TRIG 181 (H) 10/27/2019 1052   HDL 25 (L) 10/27/2019 1052   CHOLHDL 4.0 10/27/2019 1052   CHOLHDL 4.5 01/01/2017 0042   VLDL UNABLE TO CALCULATE IF TRIGLYCERIDE OVER 400 mg/dL 01/01/2017 0042   LDLCALC 46 10/27/2019 1052     RADIOLOGY: No results found.  IMPRESSION:  1. Coronary artery disease involving native coronary artery of native heart without angina pectoris   2. Hx of CABG   3. OSA on CPAP   4. Essential hypertension   5. Mixed hyperlipidemia   6. Gastroesophageal reflux disease without esophagitis     ASSESSMENT AND PLAN: [name] is a 74 year old African American male who has a  long-standing history of hypertension and diabetes mellitus. He has a family history of CAD with his mother dying from myocardial infarction  and who also diabetes mellitus at age 27.  A prior nuclear study had revealed  normal perfusion and  An echo Doppler study  revealed normal systolic function without regional wall motion abnormalities.  There was grade 2 diastolic dysfunction and to tissue Doppler was abnormal  suggesting elevation of ventricular filling pressures.  He developed new onset unstable angina leading to urgent catheterization on 01/02/2017 and  was found to have a complex bifurcation stenosis with a [place] 011 lesion.  He ultimately underwent CABG surgery with LIMA to his LAD and vein grafts to 2 diagonal vessels.  Presently, he denies any recurrent anginal symptomatology since his CABG revascularization.  He subsequently developed bradycardia  and I reduced his diltiazem down to 240 mg.  On a subsequent evaluation he inadvertently was taking 2 calcium channel blockers and I discontinued diltiazem.  Subsequently, his amlodipine dose was titrated to 7.5 mg and later to 10 mg for more optimal blood pressure benefit.  He was evaluated in the emergency room with palpitations.  On a ZIO patch monitor he was noted to have frequent PVCs with a very rare couplet and one triplet and had one episode of 4 beats of SVT but otherwise heart rate was averaging at 67.  When I last saw him I suggested slight additional titration of his a.m. carvedilol.  His blood pressure today on repeat by me was 130/62 on his current regimen now consisting of amlodipine 10 mg, carvedilol 37.5 mg twice a day and losartan 100 mg daily.  For his mixed hyperlipidemia he is on Zetia 10 mg, fenofibrate 145 mg and previously I had written him a prescription for Vascepa.  Due to insurance, he is no longer on Vascepa.  I reviewed his most recent lipid studies which are improved and LDL cholesterol is now 46.  Triglycerides are 181 with HDL at 25.  VLDL is normal at 30.[name] He is sleeping well with his current CPAP set pressure of 11 cm and AHI is excellent at 0.3.  He is unaware of  any breakthrough snoring.  His Epworth scale is 2 argue against residual daytime sleepiness.  Weight today is 2048 pounds with a BMI of 31.8.  Weight reduction and increased exercise was recommended.  He has GERD and is now on pantoprazole daily.  He will continue with current therapy.  I will see him in 6 months for follow-up evaluation or sooner if problems arise.  Troy Sine, MD, [facility] 11/01/2019 11:10 AM

## 2019-10-30 NOTE — Patient Instructions (Signed)

## 2019-11-01 ENCOUNTER — Encounter: Payer: Self-pay | Admitting: Cardiovascular Disease

## 2019-11-03 DIAGNOSIS — I10 Essential (primary) hypertension: Secondary | ICD-10-CM | POA: Diagnosis not present

## 2019-11-03 DIAGNOSIS — E1165 Type 2 diabetes mellitus with hyperglycemia: Secondary | ICD-10-CM | POA: Diagnosis not present

## 2019-11-03 DIAGNOSIS — Z6832 Body mass index (BMI) 32.0-32.9, adult: Secondary | ICD-10-CM | POA: Diagnosis not present

## 2019-11-03 DIAGNOSIS — Z299 Encounter for prophylactic measures, unspecified: Secondary | ICD-10-CM | POA: Diagnosis not present

## 2019-11-03 DIAGNOSIS — G4730 Sleep apnea, unspecified: Secondary | ICD-10-CM | POA: Diagnosis not present

## 2019-11-03 DIAGNOSIS — I472 Ventricular tachycardia: Secondary | ICD-10-CM | POA: Diagnosis not present

## 2019-11-12 DIAGNOSIS — L110 Acquired keratosis follicularis: Secondary | ICD-10-CM | POA: Diagnosis not present

## 2019-11-12 DIAGNOSIS — E1140 Type 2 diabetes mellitus with diabetic neuropathy, unspecified: Secondary | ICD-10-CM | POA: Diagnosis not present

## 2019-11-12 DIAGNOSIS — L609 Nail disorder, unspecified: Secondary | ICD-10-CM | POA: Diagnosis not present

## 2019-11-25 DIAGNOSIS — E119 Type 2 diabetes mellitus without complications: Secondary | ICD-10-CM | POA: Diagnosis not present

## 2019-11-25 DIAGNOSIS — I10 Essential (primary) hypertension: Secondary | ICD-10-CM | POA: Diagnosis not present

## 2019-11-25 DIAGNOSIS — E7800 Pure hypercholesterolemia, unspecified: Secondary | ICD-10-CM | POA: Diagnosis not present

## 2019-11-27 ENCOUNTER — Other Ambulatory Visit: Payer: Self-pay

## 2019-11-27 ENCOUNTER — Telehealth: Payer: Self-pay

## 2019-11-27 MED ORDER — EZETIMIBE 10 MG PO TABS: 10.0000 mg | ORAL_TABLET | Freq: Every day | ORAL | 3 refills | Status: DC

## 2019-11-27 NOTE — Telephone Encounter (Signed)
Pt called and reports a pain under his shoulder blade that comes and goes. States that he will call his PCP as well but wanted Dr.Kelly to review also.  He reports having a "pig tail" surgery years ago and this is where the pain is located. He had been using massage therapy at [facility] but it keeps coming back.  Notified that I would send this message to [facility] for review. No other questions at this time.

## 2019-11-27 NOTE — Telephone Encounter (Signed)
Called pt to discuss letter for medical necessity for Zetia. Notified that the letter for the medication had been sent to the [facility], pt states he has not heard anything from them yet and would like it sent to the [facility] too. Fax # [phone] and phone # [phone] I notified that we may need to fax a printed prescription to the [facility] as well. Pt verbalized understanding with no other questions at this time.

## 2019-12-02 DIAGNOSIS — Z1339 Encounter for screening examination for other mental health and behavioral disorders: Secondary | ICD-10-CM | POA: Diagnosis not present

## 2019-12-02 DIAGNOSIS — R079 Chest pain, unspecified: Secondary | ICD-10-CM | POA: Diagnosis not present

## 2019-12-02 DIAGNOSIS — Z7189 Other specified counseling: Secondary | ICD-10-CM | POA: Diagnosis not present

## 2019-12-02 DIAGNOSIS — Z299 Encounter for prophylactic measures, unspecified: Secondary | ICD-10-CM | POA: Diagnosis not present

## 2019-12-02 DIAGNOSIS — Z1211 Encounter for screening for malignant neoplasm of colon: Secondary | ICD-10-CM | POA: Diagnosis not present

## 2019-12-02 DIAGNOSIS — Z6832 Body mass index (BMI) 32.0-32.9, adult: Secondary | ICD-10-CM | POA: Diagnosis not present

## 2019-12-02 DIAGNOSIS — E1165 Type 2 diabetes mellitus with hyperglycemia: Secondary | ICD-10-CM | POA: Diagnosis not present

## 2019-12-02 DIAGNOSIS — Z0000 Encounter for general adult medical examination without abnormal findings: Secondary | ICD-10-CM | POA: Diagnosis not present

## 2019-12-02 DIAGNOSIS — Z1331 Encounter for screening for depression: Secondary | ICD-10-CM | POA: Diagnosis not present

## 2019-12-04 DIAGNOSIS — Z79899 Other long term (current) drug therapy: Secondary | ICD-10-CM | POA: Diagnosis not present

## 2019-12-04 DIAGNOSIS — R5383 Other fatigue: Secondary | ICD-10-CM | POA: Diagnosis not present

## 2019-12-04 DIAGNOSIS — E7800 Pure hypercholesterolemia, unspecified: Secondary | ICD-10-CM | POA: Diagnosis not present

## 2019-12-04 DIAGNOSIS — R079 Chest pain, unspecified: Secondary | ICD-10-CM | POA: Diagnosis not present

## 2019-12-04 DIAGNOSIS — Z125 Encounter for screening for malignant neoplasm of prostate: Secondary | ICD-10-CM | POA: Diagnosis not present

## 2019-12-11 DIAGNOSIS — M170 Bilateral primary osteoarthritis of knee: Secondary | ICD-10-CM | POA: Diagnosis not present

## 2019-12-19 DIAGNOSIS — H903 Sensorineural hearing loss, bilateral: Secondary | ICD-10-CM | POA: Diagnosis not present

## 2019-12-19 DIAGNOSIS — H6122 Impacted cerumen, left ear: Secondary | ICD-10-CM | POA: Diagnosis not present

## 2020-01-01 DIAGNOSIS — M1711 Unilateral primary osteoarthritis, right knee: Secondary | ICD-10-CM | POA: Diagnosis not present

## 2020-01-11 DIAGNOSIS — E7800 Pure hypercholesterolemia, unspecified: Secondary | ICD-10-CM | POA: Diagnosis not present

## 2020-01-11 DIAGNOSIS — I10 Essential (primary) hypertension: Secondary | ICD-10-CM | POA: Diagnosis not present

## 2020-01-11 DIAGNOSIS — E119 Type 2 diabetes mellitus without complications: Secondary | ICD-10-CM | POA: Diagnosis not present

## 2020-02-11 DIAGNOSIS — E7800 Pure hypercholesterolemia, unspecified: Secondary | ICD-10-CM | POA: Diagnosis not present

## 2020-02-11 DIAGNOSIS — E119 Type 2 diabetes mellitus without complications: Secondary | ICD-10-CM | POA: Diagnosis not present

## 2020-02-11 DIAGNOSIS — I10 Essential (primary) hypertension: Secondary | ICD-10-CM | POA: Diagnosis not present

## 2020-02-23 DIAGNOSIS — Z6831 Body mass index (BMI) 31.0-31.9, adult: Secondary | ICD-10-CM | POA: Diagnosis not present

## 2020-02-23 DIAGNOSIS — Z299 Encounter for prophylactic measures, unspecified: Secondary | ICD-10-CM | POA: Diagnosis not present

## 2020-02-23 DIAGNOSIS — E1165 Type 2 diabetes mellitus with hyperglycemia: Secondary | ICD-10-CM | POA: Diagnosis not present

## 2020-02-23 DIAGNOSIS — I472 Ventricular tachycardia: Secondary | ICD-10-CM | POA: Diagnosis not present

## 2020-02-23 DIAGNOSIS — G4730 Sleep apnea, unspecified: Secondary | ICD-10-CM | POA: Diagnosis not present

## 2020-02-23 DIAGNOSIS — I10 Essential (primary) hypertension: Secondary | ICD-10-CM | POA: Diagnosis not present

## 2020-03-03 DIAGNOSIS — E1140 Type 2 diabetes mellitus with diabetic neuropathy, unspecified: Secondary | ICD-10-CM | POA: Diagnosis not present

## 2020-03-03 DIAGNOSIS — L110 Acquired keratosis follicularis: Secondary | ICD-10-CM | POA: Diagnosis not present

## 2020-03-03 DIAGNOSIS — L609 Nail disorder, unspecified: Secondary | ICD-10-CM | POA: Diagnosis not present

## 2020-03-11 DIAGNOSIS — M1710 Unilateral primary osteoarthritis, unspecified knee: Secondary | ICD-10-CM | POA: Diagnosis not present

## 2020-03-11 DIAGNOSIS — Z789 Other specified health status: Secondary | ICD-10-CM | POA: Diagnosis not present

## 2020-03-11 DIAGNOSIS — I472 Ventricular tachycardia: Secondary | ICD-10-CM | POA: Diagnosis not present

## 2020-03-11 DIAGNOSIS — Z299 Encounter for prophylactic measures, unspecified: Secondary | ICD-10-CM | POA: Diagnosis not present

## 2020-03-11 DIAGNOSIS — S83241A Other tear of medial meniscus, current injury, right knee, initial encounter: Secondary | ICD-10-CM | POA: Diagnosis not present

## 2020-03-11 DIAGNOSIS — E1165 Type 2 diabetes mellitus with hyperglycemia: Secondary | ICD-10-CM | POA: Diagnosis not present

## 2020-03-11 DIAGNOSIS — M170 Bilateral primary osteoarthritis of knee: Secondary | ICD-10-CM | POA: Diagnosis not present

## 2020-03-11 DIAGNOSIS — I10 Essential (primary) hypertension: Secondary | ICD-10-CM | POA: Diagnosis not present

## 2020-03-12 DIAGNOSIS — E7800 Pure hypercholesterolemia, unspecified: Secondary | ICD-10-CM | POA: Diagnosis not present

## 2020-03-12 DIAGNOSIS — E119 Type 2 diabetes mellitus without complications: Secondary | ICD-10-CM | POA: Diagnosis not present

## 2020-03-12 DIAGNOSIS — I10 Essential (primary) hypertension: Secondary | ICD-10-CM | POA: Diagnosis not present

## 2020-03-23 DIAGNOSIS — R42 Dizziness and giddiness: Secondary | ICD-10-CM | POA: Diagnosis not present

## 2020-03-23 DIAGNOSIS — K219 Gastro-esophageal reflux disease without esophagitis: Secondary | ICD-10-CM | POA: Diagnosis not present

## 2020-03-23 DIAGNOSIS — Z86010 Personal history of colonic polyps: Secondary | ICD-10-CM | POA: Diagnosis not present

## 2020-03-23 DIAGNOSIS — Z1211 Encounter for screening for malignant neoplasm of colon: Secondary | ICD-10-CM | POA: Diagnosis not present

## 2020-03-26 DIAGNOSIS — Z299 Encounter for prophylactic measures, unspecified: Secondary | ICD-10-CM | POA: Diagnosis not present

## 2020-03-26 DIAGNOSIS — M1710 Unilateral primary osteoarthritis, unspecified knee: Secondary | ICD-10-CM | POA: Diagnosis not present

## 2020-03-26 DIAGNOSIS — I10 Essential (primary) hypertension: Secondary | ICD-10-CM | POA: Diagnosis not present

## 2020-03-26 DIAGNOSIS — E1165 Type 2 diabetes mellitus with hyperglycemia: Secondary | ICD-10-CM | POA: Diagnosis not present

## 2020-03-26 DIAGNOSIS — I472 Ventricular tachycardia: Secondary | ICD-10-CM | POA: Diagnosis not present

## 2020-03-30 DIAGNOSIS — S233XXA Sprain of ligaments of thoracic spine, initial encounter: Secondary | ICD-10-CM | POA: Diagnosis not present

## 2020-03-30 DIAGNOSIS — M9903 Segmental and somatic dysfunction of lumbar region: Secondary | ICD-10-CM | POA: Diagnosis not present

## 2020-03-30 DIAGNOSIS — M9902 Segmental and somatic dysfunction of thoracic region: Secondary | ICD-10-CM | POA: Diagnosis not present

## 2020-03-30 DIAGNOSIS — M9901 Segmental and somatic dysfunction of cervical region: Secondary | ICD-10-CM | POA: Diagnosis not present

## 2020-03-30 DIAGNOSIS — S134XXA Sprain of ligaments of cervical spine, initial encounter: Secondary | ICD-10-CM | POA: Diagnosis not present

## 2020-03-30 DIAGNOSIS — M47816 Spondylosis without myelopathy or radiculopathy, lumbar region: Secondary | ICD-10-CM | POA: Diagnosis not present

## 2020-04-01 DIAGNOSIS — E119 Type 2 diabetes mellitus without complications: Secondary | ICD-10-CM | POA: Diagnosis not present

## 2020-04-01 DIAGNOSIS — E7800 Pure hypercholesterolemia, unspecified: Secondary | ICD-10-CM | POA: Diagnosis not present

## 2020-04-02 DIAGNOSIS — M47816 Spondylosis without myelopathy or radiculopathy, lumbar region: Secondary | ICD-10-CM | POA: Diagnosis not present

## 2020-04-02 DIAGNOSIS — M9901 Segmental and somatic dysfunction of cervical region: Secondary | ICD-10-CM | POA: Diagnosis not present

## 2020-04-02 DIAGNOSIS — M9902 Segmental and somatic dysfunction of thoracic region: Secondary | ICD-10-CM | POA: Diagnosis not present

## 2020-04-02 DIAGNOSIS — S233XXA Sprain of ligaments of thoracic spine, initial encounter: Secondary | ICD-10-CM | POA: Diagnosis not present

## 2020-04-02 DIAGNOSIS — M9903 Segmental and somatic dysfunction of lumbar region: Secondary | ICD-10-CM | POA: Diagnosis not present

## 2020-04-02 DIAGNOSIS — S134XXA Sprain of ligaments of cervical spine, initial encounter: Secondary | ICD-10-CM | POA: Diagnosis not present

## 2020-04-06 DIAGNOSIS — M47816 Spondylosis without myelopathy or radiculopathy, lumbar region: Secondary | ICD-10-CM | POA: Diagnosis not present

## 2020-04-06 DIAGNOSIS — M9903 Segmental and somatic dysfunction of lumbar region: Secondary | ICD-10-CM | POA: Diagnosis not present

## 2020-04-06 DIAGNOSIS — S233XXA Sprain of ligaments of thoracic spine, initial encounter: Secondary | ICD-10-CM | POA: Diagnosis not present

## 2020-04-06 DIAGNOSIS — M9902 Segmental and somatic dysfunction of thoracic region: Secondary | ICD-10-CM | POA: Diagnosis not present

## 2020-04-06 DIAGNOSIS — S134XXA Sprain of ligaments of cervical spine, initial encounter: Secondary | ICD-10-CM | POA: Diagnosis not present

## 2020-04-06 DIAGNOSIS — M9901 Segmental and somatic dysfunction of cervical region: Secondary | ICD-10-CM | POA: Diagnosis not present

## 2020-04-12 DIAGNOSIS — M9903 Segmental and somatic dysfunction of lumbar region: Secondary | ICD-10-CM | POA: Diagnosis not present

## 2020-04-12 DIAGNOSIS — M47816 Spondylosis without myelopathy or radiculopathy, lumbar region: Secondary | ICD-10-CM | POA: Diagnosis not present

## 2020-04-12 DIAGNOSIS — M9901 Segmental and somatic dysfunction of cervical region: Secondary | ICD-10-CM | POA: Diagnosis not present

## 2020-04-12 DIAGNOSIS — M9902 Segmental and somatic dysfunction of thoracic region: Secondary | ICD-10-CM | POA: Diagnosis not present

## 2020-04-12 DIAGNOSIS — S233XXA Sprain of ligaments of thoracic spine, initial encounter: Secondary | ICD-10-CM | POA: Diagnosis not present

## 2020-04-12 DIAGNOSIS — S134XXA Sprain of ligaments of cervical spine, initial encounter: Secondary | ICD-10-CM | POA: Diagnosis not present

## 2020-04-13 DIAGNOSIS — I10 Essential (primary) hypertension: Secondary | ICD-10-CM | POA: Diagnosis not present

## 2020-04-21 DIAGNOSIS — M9902 Segmental and somatic dysfunction of thoracic region: Secondary | ICD-10-CM | POA: Diagnosis not present

## 2020-04-21 DIAGNOSIS — M47816 Spondylosis without myelopathy or radiculopathy, lumbar region: Secondary | ICD-10-CM | POA: Diagnosis not present

## 2020-04-21 DIAGNOSIS — S233XXA Sprain of ligaments of thoracic spine, initial encounter: Secondary | ICD-10-CM | POA: Diagnosis not present

## 2020-04-21 DIAGNOSIS — M9901 Segmental and somatic dysfunction of cervical region: Secondary | ICD-10-CM | POA: Diagnosis not present

## 2020-04-21 DIAGNOSIS — M9903 Segmental and somatic dysfunction of lumbar region: Secondary | ICD-10-CM | POA: Diagnosis not present

## 2020-04-21 DIAGNOSIS — S134XXA Sprain of ligaments of cervical spine, initial encounter: Secondary | ICD-10-CM | POA: Diagnosis not present

## 2020-04-28 DIAGNOSIS — S233XXA Sprain of ligaments of thoracic spine, initial encounter: Secondary | ICD-10-CM | POA: Diagnosis not present

## 2020-04-28 DIAGNOSIS — M47816 Spondylosis without myelopathy or radiculopathy, lumbar region: Secondary | ICD-10-CM | POA: Diagnosis not present

## 2020-04-28 DIAGNOSIS — M9903 Segmental and somatic dysfunction of lumbar region: Secondary | ICD-10-CM | POA: Diagnosis not present

## 2020-04-28 DIAGNOSIS — M9902 Segmental and somatic dysfunction of thoracic region: Secondary | ICD-10-CM | POA: Diagnosis not present

## 2020-04-28 DIAGNOSIS — S134XXA Sprain of ligaments of cervical spine, initial encounter: Secondary | ICD-10-CM | POA: Diagnosis not present

## 2020-04-28 DIAGNOSIS — M9901 Segmental and somatic dysfunction of cervical region: Secondary | ICD-10-CM | POA: Diagnosis not present

## 2020-05-04 DIAGNOSIS — I10 Essential (primary) hypertension: Secondary | ICD-10-CM | POA: Diagnosis not present

## 2020-05-04 DIAGNOSIS — Z6831 Body mass index (BMI) 31.0-31.9, adult: Secondary | ICD-10-CM | POA: Diagnosis not present

## 2020-05-04 DIAGNOSIS — E7800 Pure hypercholesterolemia, unspecified: Secondary | ICD-10-CM | POA: Diagnosis not present

## 2020-05-04 DIAGNOSIS — Z299 Encounter for prophylactic measures, unspecified: Secondary | ICD-10-CM | POA: Diagnosis not present

## 2020-05-04 DIAGNOSIS — H8110 Benign paroxysmal vertigo, unspecified ear: Secondary | ICD-10-CM | POA: Diagnosis not present

## 2020-05-12 DIAGNOSIS — L609 Nail disorder, unspecified: Secondary | ICD-10-CM | POA: Diagnosis not present

## 2020-05-12 DIAGNOSIS — M9901 Segmental and somatic dysfunction of cervical region: Secondary | ICD-10-CM | POA: Diagnosis not present

## 2020-05-12 DIAGNOSIS — K219 Gastro-esophageal reflux disease without esophagitis: Secondary | ICD-10-CM | POA: Diagnosis not present

## 2020-05-12 DIAGNOSIS — M47816 Spondylosis without myelopathy or radiculopathy, lumbar region: Secondary | ICD-10-CM | POA: Diagnosis not present

## 2020-05-12 DIAGNOSIS — E1140 Type 2 diabetes mellitus with diabetic neuropathy, unspecified: Secondary | ICD-10-CM | POA: Diagnosis not present

## 2020-05-12 DIAGNOSIS — S233XXA Sprain of ligaments of thoracic spine, initial encounter: Secondary | ICD-10-CM | POA: Diagnosis not present

## 2020-05-12 DIAGNOSIS — Z299 Encounter for prophylactic measures, unspecified: Secondary | ICD-10-CM | POA: Diagnosis not present

## 2020-05-12 DIAGNOSIS — M9902 Segmental and somatic dysfunction of thoracic region: Secondary | ICD-10-CM | POA: Diagnosis not present

## 2020-05-12 DIAGNOSIS — L110 Acquired keratosis follicularis: Secondary | ICD-10-CM | POA: Diagnosis not present

## 2020-05-12 DIAGNOSIS — K529 Noninfective gastroenteritis and colitis, unspecified: Secondary | ICD-10-CM | POA: Diagnosis not present

## 2020-05-12 DIAGNOSIS — M9903 Segmental and somatic dysfunction of lumbar region: Secondary | ICD-10-CM | POA: Diagnosis not present

## 2020-05-12 DIAGNOSIS — S134XXA Sprain of ligaments of cervical spine, initial encounter: Secondary | ICD-10-CM | POA: Diagnosis not present

## 2020-05-13 DIAGNOSIS — E119 Type 2 diabetes mellitus without complications: Secondary | ICD-10-CM | POA: Diagnosis not present

## 2020-05-13 DIAGNOSIS — I10 Essential (primary) hypertension: Secondary | ICD-10-CM | POA: Diagnosis not present

## 2020-05-13 DIAGNOSIS — E7800 Pure hypercholesterolemia, unspecified: Secondary | ICD-10-CM | POA: Diagnosis not present

## 2020-05-19 DIAGNOSIS — S233XXA Sprain of ligaments of thoracic spine, initial encounter: Secondary | ICD-10-CM | POA: Diagnosis not present

## 2020-05-19 DIAGNOSIS — M9902 Segmental and somatic dysfunction of thoracic region: Secondary | ICD-10-CM | POA: Diagnosis not present

## 2020-05-19 DIAGNOSIS — M9901 Segmental and somatic dysfunction of cervical region: Secondary | ICD-10-CM | POA: Diagnosis not present

## 2020-05-19 DIAGNOSIS — M9903 Segmental and somatic dysfunction of lumbar region: Secondary | ICD-10-CM | POA: Diagnosis not present

## 2020-05-19 DIAGNOSIS — S134XXA Sprain of ligaments of cervical spine, initial encounter: Secondary | ICD-10-CM | POA: Diagnosis not present

## 2020-05-19 DIAGNOSIS — M47816 Spondylosis without myelopathy or radiculopathy, lumbar region: Secondary | ICD-10-CM | POA: Diagnosis not present

## 2020-05-31 ENCOUNTER — Ambulatory Visit (INDEPENDENT_AMBULATORY_CARE_PROVIDER_SITE_OTHER): Payer: Medicare Other | Admitting: Cardiovascular Disease

## 2020-05-31 ENCOUNTER — Other Ambulatory Visit: Payer: Self-pay

## 2020-05-31 VITALS — BP 132/74 | HR 59 | Ht 74.0 in | Wt 242.0 lb

## 2020-05-31 DIAGNOSIS — I10 Essential (primary) hypertension: Secondary | ICD-10-CM

## 2020-05-31 DIAGNOSIS — I25810 Atherosclerosis of coronary artery bypass graft(s) without angina pectoris: Secondary | ICD-10-CM

## 2020-05-31 DIAGNOSIS — G4733 Obstructive sleep apnea (adult) (pediatric): Secondary | ICD-10-CM

## 2020-05-31 DIAGNOSIS — E785 Hyperlipidemia, unspecified: Secondary | ICD-10-CM | POA: Diagnosis not present

## 2020-05-31 DIAGNOSIS — R0789 Other chest pain: Secondary | ICD-10-CM

## 2020-05-31 DIAGNOSIS — R072 Precordial pain: Secondary | ICD-10-CM | POA: Diagnosis not present

## 2020-05-31 DIAGNOSIS — Z9989 Dependence on other enabling machines and devices: Secondary | ICD-10-CM | POA: Diagnosis not present

## 2020-05-31 DIAGNOSIS — I2510 Atherosclerotic heart disease of native coronary artery without angina pectoris: Secondary | ICD-10-CM

## 2020-05-31 DIAGNOSIS — Z951 Presence of aortocoronary bypass graft: Secondary | ICD-10-CM

## 2020-05-31 NOTE — Patient Instructions (Signed)
Medication Instructions:  NO CHANGES *If you need a refill on your cardiac medications before your next appointment, please call your pharmacy*   Lab Work: NOT NEEDED .   Testing/Procedures:  WILL BE SCHEDULE AT  [facility] Your physician has requested that you have a lexiscan myoview. Please follow instruction sheet, as given.   Follow-Up: At [facility], you and your health needs are our priority.  As part of our continuing mission to provide you with exceptional heart care, we have created designated Provider Care Teams.  These Care Teams include your primary Cardiologist (physician) and Advanced Practice Providers (APPs -  Physician Assistants and Nurse Practitioners) who all work together to provide you with the care you need, when you need it.    Your next appointment:   6 month(s)  The format for your next appointment:   In Person  Provider:   Shelva Majestic, MD   Other Instructions

## 2020-05-31 NOTE — Progress Notes (Signed)
Patient ID: William Orr, male   DOB: [birth year], 74 y.o.   MRN: [id number]    Primary M.D.: Dr.A Lora Havens  HPI: William Orr is a 74 year old African American male who presents to the office for a 7 month follow-up cardiology/sleep evaluation.  William Orr  has a long history of hypertension,  type 2 diabetes mellitus, and hyperlipidemia. He  underwent an exercise nuclear study in [place] for atypical chest pain which revealed normal perfusion with an ejection fraction of 61%. There was no evidence for ischemia and he had normal wall motion. He was hypertensive throughout the study.  He has been maintained on blood pressure medicines consisting of diltiazem 300 mg daily, carvedilol 25 mg twice a day, and losartan HCT 100/25 mg. He previously, his had taken simvastatin 20 mg for hyperlipidemia  but self discontinued this.  Recently, he has been taking over-the-counter supplements with fair amount of omega-3 fatty acids for elevated triglycerides.[name] He takes Glucophage 850 mg in the morning and 2 tablets at night.   He has had some instances of atypical, probable musculoskeletal chest pain which improved with nonsteroidal therapy.  He underwent a 2-D echo Doppler study on 09/25/2013 and this revealed an ejection fraction of 60-65% with mild focal basal hypertrophy of the septum had normal diastolic parameters. There was moderate thickening of the trileaflet aortic valve consistent with aortic valve sclerosis. He had mild left atrial dilatation. His right ventricle was upper normal to mildly dilated.    Due to my concerns that he may have significant obstructive sleep apnea he underwent a diagnostic polysomnogram in March 2015 which revealed mild sleep apnea overall, with an AHI of 5.8, but he had moderate sleep apnea during REM sleep with an AHI of 25/hr.  He also had significant oxygen desaturation to 84% with non-REM sleep and 76% with REM sleep and had evidence for very loud snoring.  His  periodic limb movement index was 6.7 per hour with 9.5 per hour to arousal.  He underwent CPAP titration and 11 cm pressure was recommended.  He has a ResMed AirSense10 AutoSet.  A download from 03/11/2014 through 04/09/2014 revealed that he is meeting Medicare compliance with 100% days of the device usage.  97% of the time was used at greater than 4 hours.  With his current setting, AHI is excellent at 1.0.  His were index was 0.4.  He's using a fullface mask.  His DME company is [employer] in [place], [place].  Subjectively, since initiating therapy.  He feels markedly improved.  He denies daytime sleepiness.  He denies breakthrough snoring.  His sleep is more restorative.  He has more energy.  He denies hypersomnolence.  When I saw him in 2017, he  continued to use CPAP but occasional night he would fall asleep before putting on his CPAP unit.  He does not sleep as well most nights and oftentimes wakes up several more times to go to the bathroom. Recently, he has noticed that he may experience some increased palpitations in the morning.  He has not been able to correlate if this is related to potential night sweats.   A download was obtained from 09/27/2015 through 12/25/2015.  This showed 77% of days of use with 69% of the days greater than 4 hours.  He was averaging 5 hours and 42 minutes of CPAP use.  His AHI at 11 cm water pressure remained excellent at 0.7.    A 2-D echo Doppler study revealed an EF of 60-65%.  There was grade 2 diastolic dysfunction.  There was aortic valve sclerosis without stenosis or regurgitation.  There was suggestion that there was mildly reduced RV function.  He was admitted to [facility] with unstable angina and underwent catheterization on 01/02/2017 in the setting of a non-ST segment MI.  He was found to have multifocal high-grade disease with complex bifurcation stenosis of his proximal LAD and diagonal.  He underwent CABG revascularization surgery by Dr. [name] on  01/04/2017 and had a LIMA placed to his distal LAD, vein graft to the first diagonal vessel, and vein graft to the second diagonal vessel.  He was seen in follow-up by Almyra Deforest, PAC.  He had significant hypertriglyceridemia and was started on lovaza and due to myalgias with Crestor statin therapy was changed to simvastatin, which he has tolerated.  He completed 8 weeks of cardiac rehabilitation with his last exercise this morning.  Typically his blood pressure at cardiac rehabilitation systolically is in the 144R to 130s.  He denies any recurrent chest pain or palpitations.  He continues to use CPAP with 100% compliance with [employer] as his DME company.  He's not had a recent download.    When I saw him in September 2018.  His blood pressure was elevated on losartan 100 mg, diltiazem 3 mg, and carvedilol 25 mg twice a day.  He was taken [place] X3 omega-3 fatty acid, which included 1.125 g of EPA and to 75 mg of DHA.  Lipid studies were improved.  Triglycerides were still elevated at 340, down from 413.  Fenofibrate 145 to his medical regimen.  Over the past several months, he has felt well.  He's been without chest pain.  He uses CPAP with 100% compliance.  He denies chest pain, PND, orthopnea.  He saw Dr. Brigitte Pulse and his hemoglobin A1c was 6.0.  He is off statins due to statin intolerance.    When I  saw him in 2019 hisblood pressure was stable.  He was not having any anginal symptoms.  Is bradycardic and I reduced his low-dose. He was  continuing to use CPAP.  He was in need to undergo endoscopy scheduled for April 24, 2018 and clearance was necessary to hold his aspirin and Plavix.  He denies any recurrent anginal symptoms.  He continues to use CPAP.  A download was obtained in April 19 through Dec 29, 2017.  DME company is [employer] in [place] and apparently we were unable to access his information beyond May 18.  However he still admits to 100% compliance.  On that download he was continuing to  meet compliance standards.  CPAP pressure is set at 11 cm of water.  AHI is excellent at 0.7.    When I saw him in June 2019 I discussed improved sleep duration subsequently he has now been wearing CPAP all night.  Previously he used to fall asleep without it and then put it on in the middle of the night.  I last saw him in October 2019.  At that time he had improved sleep duration and was he now is using CPAP almost 8 hours per night.  A new download was obtained from April 23, 2018 through May 22, 2018.  He is 100% compliant.  At a set pressure of 11's 11 cm, AHI is excellent at 0.4.  He does not have any leak.  He has more energy.  He is sleeping much better.  His previous 3+ nocturia has essentially vanished.  He will  admits to some fatigue and being tired.  Pulse is been in the low 50s.  During that evaluation, I recommended discontinuance of diltiazem in light of his bradycardia and he is now been on amlodipine which was titrated to 7.5 mg.  This was subsequently titrated further to 10 mg.  He had seen Jory Sims in late December 2019 following an emergency room evaluation of August 03, 2018.  During his ER evaluation he had complaints of palpitations with vague chest pain.  His ECG showed sinus rhythm with ventricular trigeminy and prolonged PR interval.  He was started on Imdur but stopped taking the Imdur secondary to development of significant headache.  He underwent a Zio patch monitor for 2 weeks.  This showed predominant sinus rhythm with average heart rate 67 bpm.  There was one episode of supraventricular tachycardia which lasted 4 beats at a rate of 139.  He had isolated PACs.  There were rare atrial couplets.  He had isolated PVCs which were frequent with rare episodes of ventricular couplet and one ventricular triplet but had some episodes of ventricular bigeminy and trigeminy.   I  saw him in September 2020 since his prior February 2020, he continued to feel well.  He denied  exertional chest pain symptomatology but admitted to a rare nonexertional chest wall sensation. He has continued to be on carvedilol 25 mg twice a day, amlodipine 10 mg and losartan 100 mg for hypertension.  He is diabetic on metformin.  He is on Zetia and fenofibrate for his mixed hyperlipidemia.  Laboratory on April 16, 2019 showed a total cholesterol 119 LDL cholesterol 58 but triglycerides although improved was still elevated at 215 with HDL of 26.  He continues to use CPAP with 1/2% compliance.  A download was obtained in the office today from August 12 through April 24, 2019 which confirms 100% compliance with average usage at 7 hours and 6 minutes.  He is set at 11 cm water pressure and AHI is excellent at 0.7.    I last saw him in March 2021 at which time he remained stable from a cardiovascular standpoint and was without angina symptomatology.  At times he noted a pinching sensation at different sites in his chest wall.  He also experienced a rare sharp twinge of chest pain. He continued to use CPAP with excellent compliance and denies residual daytime sleepiness.  A download was obtained in the office today from February 16 through October 29, 2019.  Compliance is excellent with average usage 7 hours and 21 minutes.  At his 11 cm set pressure, AHI is excellent at 0.3.  There is no significant mask leak.  His DME company is [employer] in [place].  An Epworth Sleepiness Scale score was calculated in the office today this endorsed at 2 arguing against residual daytime sleepiness.  He had undergone repeat laboratory on October 27, 2019.  Lipids were improved with total cholesterol 101 and LDL cholesterol was excellent at 46.  However triglycerides remain slightly elevated at 181 with a low HDL at 25.    Since I last saw him, he has been maintaining sinus rhythm and has a cardio mobile device.  He experiences some occasional nonexertional chest sensations.  He is able to cut the grass and perform  weed eating without difficulty.  He has a torn meniscus in his right knee.  He was diagnosed with benign positional vertigo.  He was concerned about his atypical chest pain.  He continues to use  CPAP therapy and a download in the office revealed 10 percent compliance.  Apparently, last data obtained was May 28.  He denied getting some supplies from his DME company and he continues to admit to 100% use but he may no longer be linked to our office since I was unable to obtain any data from May 28.  He presents for evaluation.  Past Medical History:  Diagnosis Date  . Diabetes mellitus without complication ([place])   . History of hiatal hernia   . Hyperlipidemia   . Hypertension   . S/P CABG x 3 01/04/2017   LIMA to LAD, Sequential SVG to D1-D2, EVH via right thigh    Past Surgical History:  Procedure Laterality Date  . CORONARY ARTERY BYPASS GRAFT N/A 01/04/2017   Procedure: CORONARY ARTERY BYPASS GRAFTING (CABG)X3, ON PUMP, USING LEFT INTERNAL MAMMARY ARTERY AND RIGHT GREATER SAPHENOUS VEIN HARVESTED ENDOSCOPICALLY, LIMA-LAD, SEQ SVG- DIAG 1 -DIAG 2;  Surgeon: [name], MD;  Location: [facility];  Service: Open Heart Surgery;  Laterality: N/A;  . KNEE ARTHROSCOPY Bilateral   . LEFT HEART CATH AND CORONARY ANGIOGRAPHY N/A 01/02/2017   Procedure: Left Heart Cath and Coronary Angiography;  Surgeon: [name], MD;  Location: [facility];  Service: Cardiovascular;  Laterality: N/A;  . TEE WITHOUT CARDIOVERSION N/A 01/04/2017   Procedure: TRANSESOPHAGEAL ECHOCARDIOGRAM (TEE);  Surgeon: [name], MD;  Location: [facility];  Service: Open Heart Surgery;  Laterality: N/A;    Allergies  Allergen Reactions  . Jardiance [Empagliflozin] Shortness Of Breath, Rash and Cough  . Lisinopril Cough  . Statins     Muscle pain     Current Outpatient Medications  Medication Sig Dispense Refill  . acetaminophen (TYLENOL) 650 MG CR tablet Take 650 mg by mouth every 8 (eight) hours as needed for  pain.    . Alogliptin Benzoate 25 MG TABS Take 1 tablet by mouth every morning.    [name] amLODipine (NORVASC) 10 MG tablet Take 10 mg by mouth daily.    [name] Apoaequorin (PREVAGEN PO) Take 1 tablet by mouth daily.    [name] Apple Cider Vinegar 500 MG TABS Take 3 tablets by mouth daily.    [name] aspirin EC 81 MG EC tablet Take 1 tablet (81 mg total) by mouth daily.    . carvedilol (COREG) 25 MG tablet Take 1.5 tablet (37.5) in the AM, and 1 tablet (25 mg) in the PM (Patient taking differently: Take 1.5 tablet (37.5) in the AM, and pm) 135 tablet 3  . cetirizine (ZYRTEC) 10 MG tablet Take 10 mg by mouth daily as needed for allergies.     . Cholecalciferol (VITAMIN D-3) 5000 UNITS TABS Take 10,000 Units by mouth every morning.     [name] COPPER PO Take 1 capsule by mouth daily.    [name] ezetimibe (ZETIA) 10 MG tablet Take 1 tablet (10 mg total) by mouth daily. 90 tablet 3  . fenofibrate (TRICOR) 145 MG tablet Take 1 tablet (145 mg total) by mouth daily. (Patient taking differently: Take 145 mg by mouth every evening. ) 90 tablet 3  . fluticasone (FLONASE) 50 MCG/ACT nasal spray Place 1 spray into both nostrils daily as needed (seasonal allergies).     . hydrochlorothiazide (HYDRODIURIL) 25 MG tablet     . L-Glutathione CRYS Take 500 mg by mouth daily.    [name] losartan (COZAAR) 100 MG tablet Take 1 tablet (100 mg total) by mouth daily. (Patient taking differently: Take 100 mg  by mouth every morning. ) 90 tablet 3  . magnesium oxide (MAG-OX) 400 MG tablet Take 400 mg by mouth every morning.    . metFORMIN (GLUCOPHAGE) 850 MG tablet Take 850 mg by mouth 2 (two) times daily with a meal.     . Misc Natural Products (TURMERIC CURCUMIN) CAPS Take by mouth 2 (two) times daily.    . Multiple Vitamin (MULTIVITAMIN WITH MINERALS) TABS tablet Take 2 tablets by mouth every morning.     . NON FORMULARY Take 2 capsules by mouth daily. Amazin Grape: MUscadine Grapeseed/Skin    . Omega-3 Fatty Acids (OMEGA-3 FISH OIL PO) Take 2 capsules by  mouth daily.    [name] OVER THE COUNTER MEDICATION daily. Blood boost    . pantoprazole (PROTONIX) 40 MG tablet Take 1 tablet by mouth every morning.   6  . zinc gluconate 50 MG tablet Take 50 mg by mouth daily.     No current facility-administered medications for this visit.    Social history is notable he is married for 46 years. He has 2 children and 3 grandchildren. 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 There is no history of tobacco use. There is no alcohol use.  Family History  Problem Relation Age of Onset  . Diabetes Mother   . Cancer - Prostate Father   . Rheum arthritis Sister   . Diabetes Brother   . Arthritis Brother   . Cancer - Other Brother        pancreatic  . Rheum arthritis Brother   . Diabetes type II Son    ROS General: Negative; No fevers, chills, or night sweats;  HEENT: Negative; No changes in vision or hearing, sinus congestion, difficulty swallowing Pulmonary: Negative; No cough, wheezing, shortness of breath, hemoptysis Cardiovascular: Occasional atypical chest pain; No presyncope, syncope, palpitations GI: Positive for hiatal hernia; No nausea, vomiting, diarrhea, or abdominal pain GU: Negative; No dysuria, hematuria, or difficulty voiding Musculoskeletal: Negative; no myalgias, joint pain, or weakness Hematologic/Oncology: Negative; no easy bruising, bleeding Endocrine: Positive for diabetes; no heat/cold intolerance; Neuro: Negative; no changes in balance, headaches Skin: Negative; No rashes or skin lesions Psychiatric: Negative; No behavioral problems, depression Sleep: OSA but since starting CPAP therapy no snoring, daytime sleepiness, hypersomnolence, bruxism, restless legs, hypnogognic hallucinations, no cataplexy Other comprehensive 14 point system review is negative.   PE BP 132/74   Pulse (!) 59   Ht $R'6\' 2"'XS$  (1.88 m)   Wt 242 lb (109.8 kg)   SpO2 96%    BMI 31.07 kg/m    Repeat blood pressure by me 130/70  Wt Readings from Last 3 Encounters:  05/31/20 242 lb (109.8 kg)  10/30/19 248 lb (112.5 kg)  04/29/19 246 lb 12.8 oz (111.9 kg)   General: Alert, oriented, no distress.  Skin: normal turgor, no rashes, warm and dry HEENT: Normocephalic, atraumatic. Pupils equal round and reactive to light; sclera anicteric; extraocular muscles intact;  Nose without nasal septal hypertrophy Mouth/Parynx benign; Mallinpatti scale 3 Neck: No JVD, no carotid bruits; normal carotid upstroke Lungs: clear to ausculatation and percussion; no wheezing or rales Chest wall: without tenderness to palpitation Heart: PMI not displaced, RRR, s1 s2 normal, 1/6 systolic murmur, no diastolic murmur, no rubs, gallops, thrills, or heaves Abdomen: soft, nontender; no hepatosplenomehaly, BS+; abdominal aorta nontender and not dilated by palpation. Back: no CVA tenderness Pulses 2+ Musculoskeletal: full range of motion,  normal strength, no joint deformities Extremities: no clubbing cyanosis or edema, Homan's sign negative  Neurologic: grossly nonfocal; Cranial nerves grossly wnl Psychologic: Normal mood and affect   ECG (independently read by me): Sinus bradycardia at 59; normal intervals  March 18, 2021ECG (independently read by me): Sinus bradycardia 59 bpm. PR interval 206 ms. No ectopy.    April 29, 2019  ECG (independently read by me): Normal sinus rhythm at 62 bpm.  Borderline first-degree AV block with a period of 206 ms.  No ectopy.  09/24/2018 ECG (independently read by me): Sinus rhythm at 66 bpm.  Q wave in lead III and aVF.  Peroneal 194 ms.  QTc interval 400 ms.  May 24, 2018 ECG (independently read by me): Sinus bradycardia 50 bpm.  Borderline first-degree AV block with a PR interval of 206 ms.  Poor anterior R wave progression.  No ectopy.  April 11, 2018 ECG (independently read by me): Sinus bradycardia at 47 bpm.  First-degree AV block.  PR  interval 228 ms.  May 2019 ECG (independently read by me): Sinus bradycardia 52 bpm with mild sinus arrhythmia.  First-degree AV block with a PR interval at 218 ms.  QS complex V1 V2.  September 2018 ECG (independently read by me): Sinus rhythm at 60 bpm with first-degree AV block.  Small Q wave in lead 3.  Nonspecific T changes.  PR interval 220 ms.  May 2018 ECG (independently read by me): Sinus bradycardia 52 bpm.  Q wave in lead 3.  Normal intervals  January 2017 ECG (independently read by me):  Normal sinus rhythm at 62 with an isolated PVC.  QTC 410 ms.  First degree AV block with a PR interval 226.  Previous ECG from 09/15/13 (independently read by me): Sinus rhythm at 67 beats per minute with borderline first-degree block with PR interval 212 ms.   LABS:  BMP Latest Ref Rng & Units 10/27/2019 08/03/2018 07/12/2018  Glucose 65 - 99 mg/dL 135(H) 93 132(H)  BUN 8 - 27 mg/dL $Remove'18 17 21  'bOgvCEJ$ Creatinine 0.76 - 1.27 mg/dL 1.06 1.09 1.19  BUN/Creat Ratio 10 - 24 17 - 18  Sodium 134 - 144 mmol/L 138 137 137  Potassium 3.5 - 5.2 mmol/L 4.7 4.0 4.7  Chloride 96 - 106 mmol/L 103 104 99  CO2 20 - 29 mmol/L $RemoveB'20 26 24  'iimldnfT$ Calcium 8.6 - 10.2 mg/dL 9.7 9.5 10.0   Hepatic Function Latest Ref Rng & Units 10/27/2019 04/16/2019 08/13/2018  Total Protein 6.0 - 8.5 g/dL 6.8 6.7 6.6  Albumin 3.7 - 4.7 g/dL 4.4 4.3 4.2  AST 0 - 40 IU/L $Remov'18 15 15  'DhhuKD$ ALT 0 - 44 IU/L $Remov'11 14 13  'aRlazR$ Alk Phosphatase 39 - 117 IU/L 69 49 61  Total Bilirubin 0.0 - 1.2 mg/dL 0.3 0.3 <0.2  Bilirubin, Direct 0.00 - 0.40 mg/dL - 0.14 0.12   CBC Latest Ref Rng & Units 10/27/2019 08/03/2018 01/09/2017  WBC 3.4 - 10.8 x10E3/uL 6.9 4.9 5.7  Hemoglobin 13.0 - 17.7 g/dL 12.9(L) 12.8(L) 9.9(L)  Hematocrit 37.5 - 51.0 % 39.5 41.0 30.4(L)  Platelets 150 - 450 x10E3/uL 247 241 268   Lab Results  Component Value Date   MCV 93 10/27/2019   MCV 94.5 08/03/2018   MCV 91.8 01/09/2017   Lab Results  Component Value Date   TSH 2.580 10/27/2019   Lab  Results  Component Value Date   HGBA1C 7.2 (H) 01/03/2017   Lipid Panel     Component  Value Date/Time   CHOL 101 10/27/2019 1052   TRIG 181 (H) 10/27/2019 1052   HDL 25 (L) 10/27/2019 1052   CHOLHDL 4.0 10/27/2019 1052   CHOLHDL 4.5 01/01/2017 0042   VLDL UNABLE TO CALCULATE IF TRIGLYCERIDE OVER 400 mg/dL 01/01/2017 0042   LDLCALC 46 10/27/2019 1052     RADIOLOGY: No results found.  IMPRESSION:  1. Coronary artery disease involving native coronary artery of native heart without angina pectoris   2. S/P CABG x 3   3. OSA on CPAP   4. Precordial pain   5. Essential hypertension   6. Hyperlipidemia with target LDL less than 70     ASSESSMENT AND PLAN: William Orr is a 74 year old African American male who has a  long-standing history of hypertension and diabetes mellitus. He has a family history of CAD with his mother dying from myocardial infarction and who also diabetes mellitus at age 56.  A remote nuclear study had revealed normal perfusion and  An echo Doppler study  revealed normal systolic function without regional wall motion abnormalities.  There was grade 2 diastolic dysfunction and to tissue Doppler was abnormal  suggesting elevation of ventricular filling pressures.  He developed new onset unstable angina leading to urgent catheterization on 01/02/2017 and  was found to have a complex bifurcation stenosis with a [place] 011 lesion.  He ultimately underwent CABG surgery with LIMA to his LAD and vein grafts to 2 diagonal vessels.    He subsequently developed bradycardia  and I reduced his diltiazem down to 240 mg.  On a subsequent evaluation he inadvertently was taking 2 calcium channel blockers and I discontinued diltiazem.  Subsequently, his amlodipine dose was titrated to 7.5 mg and later to 10 mg for more optimal blood pressure benefit.  He had developed palpitations leading to an emergency room evaluation in December 2019 and on a ZIO patch monitor he was noted to have  frequent PVCs with a very rare couplet and one triplet and had one episode of 4 beats of SVT but otherwise heart rate was averaging at 67. I suggested slight additional titration of his a.m. carvedilol.  His blood pressure today is stable on his regimen now consisting of amlodipine 10 mg, carvedilol 37.5 mg twice a day, HCTZ 25 mg and losartan 100 mg daily.  He is on Zetia 10 mg for hyperlipidemia in addition to fenofibrate and apparently did not tolerate statins.  Target LDL is less than 70.  He continues to use CPAP and continues to have 100% compliance.  Apparently his DME company had tried to send additional supplies and he did not need them in May.  It does not appear that he has been linked to our office since that time since there is no data in the [employer] app since May 28 when apparently he denied the supplies.  We will try to contact his DME company so that he can be relinked in to allow [place] to continue to monitor his CPAP.  He is concerned about some of his atypical chest pain.  He has not had a stress test since his CABG revascularization.  We will schedule him for a 3-year follow-up nuclear perfusion study following bypass surgery.  He continues to be active but due to orthopedic issues with a torn meniscus he is not able to exercise as he had in the past.  He has had issues with benign positional vertigo.  I will contact him regarding the results of his [place] study.  I will see him in 6 months for reevaluation.   His blood pressure today on repeat by me was 130/62 on his current regimen now consisting of amlodipine 10 mg, carvedilol 37.5 mg twice a day and losartan 100 mg daily.  For his mixed hyperlipidemia he is on Zetia 10 mg, fenofibrate 145 mg and previously I had written him a prescription for Vascepa.  Due to insurance, he is no longer on Vascepa.  I reviewed his most recent lipid studies which are improved and LDL cholesterol is now 46.  Triglycerides are 181 with HDL at 25.  VLDL is  normal at 30.[name] He is sleeping well with his current CPAP set pressure of 11 cm and AHI is excellent at 0.3.  He is unaware of any breakthrough snoring.  His Epworth scale is 2 argue against residual daytime sleepiness.  Weight today is 2048 pounds with a BMI of 31.8.  Weight reduction and increased exercise was recommended.  He has GERD and is now on pantoprazole daily.  He will continue with current therapy.  I will see him in 6 months for follow-up evaluation or sooner if problems arise.  Troy Sine, MD, [facility] 06/02/2020 11:38 AM

## 2020-06-01 ENCOUNTER — Telehealth: Payer: Self-pay | Admitting: Cardiovascular Disease

## 2020-06-01 ENCOUNTER — Encounter: Payer: Self-pay | Admitting: Cardiovascular Disease

## 2020-06-01 NOTE — Telephone Encounter (Signed)
Spoke with patient regarding appointemnt for [facility] ordered by Dr. Lysbeth Galas Wednesday 06/09/20 at 12:45 pm.  Will mail information to patient and he voiced his understanding

## 2020-06-01 NOTE — Telephone Encounter (Signed)
Left message for patient to call and schedule Lexiscan myoview ordered by Dr. Kelly 

## 2020-06-02 ENCOUNTER — Encounter: Payer: Self-pay | Admitting: Cardiovascular Disease

## 2020-06-02 DIAGNOSIS — S233XXA Sprain of ligaments of thoracic spine, initial encounter: Secondary | ICD-10-CM | POA: Diagnosis not present

## 2020-06-02 DIAGNOSIS — S134XXA Sprain of ligaments of cervical spine, initial encounter: Secondary | ICD-10-CM | POA: Diagnosis not present

## 2020-06-02 DIAGNOSIS — M47816 Spondylosis without myelopathy or radiculopathy, lumbar region: Secondary | ICD-10-CM | POA: Diagnosis not present

## 2020-06-02 DIAGNOSIS — M9902 Segmental and somatic dysfunction of thoracic region: Secondary | ICD-10-CM | POA: Diagnosis not present

## 2020-06-02 DIAGNOSIS — M9901 Segmental and somatic dysfunction of cervical region: Secondary | ICD-10-CM | POA: Diagnosis not present

## 2020-06-02 DIAGNOSIS — M9903 Segmental and somatic dysfunction of lumbar region: Secondary | ICD-10-CM | POA: Diagnosis not present

## 2020-06-04 ENCOUNTER — Telehealth (HOSPITAL_COMMUNITY): Payer: Self-pay | Admitting: *Deleted

## 2020-06-04 NOTE — Telephone Encounter (Signed)
Close encounter 

## 2020-06-09 ENCOUNTER — Ambulatory Visit (HOSPITAL_COMMUNITY)
Admission: RE | Admit: 2020-06-09 | Discharge: 2020-06-09 | Disposition: A | Discharge status: Home / Self Care | Payer: Medicare Other | Source: Ambulatory Visit | Attending: Cardiovascular Disease | Admitting: Cardiovascular Disease

## 2020-06-09 ENCOUNTER — Other Ambulatory Visit: Payer: Self-pay

## 2020-06-09 DIAGNOSIS — I2510 Atherosclerotic heart disease of native coronary artery without angina pectoris: Secondary | ICD-10-CM | POA: Diagnosis not present

## 2020-06-09 DIAGNOSIS — Z951 Presence of aortocoronary bypass graft: Secondary | ICD-10-CM | POA: Insufficient documentation

## 2020-06-09 DIAGNOSIS — R072 Precordial pain: Secondary | ICD-10-CM | POA: Insufficient documentation

## 2020-06-09 LAB — MYOCARDIAL PERFUSION IMAGING
LV dias vol: 158 mL (ref 62–150)
LV sys vol: 61 mL
Peak HR: 74 {beats}/min
Rest HR: 63 {beats}/min
SDS: 0
SRS: 0
SSS: 0
TID: 0.92

## 2020-06-09 MED ORDER — TECHNETIUM TC 99M TETROFOSMIN IV KIT
10.1000 | PACK | Freq: Once | INTRAVENOUS | Status: AC | PRN
  Administered 2020-06-09: 10.1 via INTRAVENOUS
  Filled 2020-06-09: qty 11

## 2020-06-09 MED ORDER — REGADENOSON 0.4 MG/5ML IV SOLN
0.4000 mg | Freq: Once | INTRAVENOUS | Status: AC
  Administered 2020-06-09: 0.4 mg via INTRAVENOUS

## 2020-06-09 MED ORDER — TECHNETIUM TC 99M TETROFOSMIN IV KIT
30.1000 | PACK | Freq: Once | INTRAVENOUS | Status: AC | PRN
  Administered 2020-06-09: 30.1 via INTRAVENOUS
  Filled 2020-06-09: qty 31

## 2020-06-11 DIAGNOSIS — E7800 Pure hypercholesterolemia, unspecified: Secondary | ICD-10-CM | POA: Diagnosis not present

## 2020-06-11 DIAGNOSIS — E119 Type 2 diabetes mellitus without complications: Secondary | ICD-10-CM | POA: Diagnosis not present

## 2020-06-12 DIAGNOSIS — I10 Essential (primary) hypertension: Secondary | ICD-10-CM | POA: Diagnosis not present

## 2020-06-14 ENCOUNTER — Telehealth: Payer: Self-pay | Admitting: Cardiovascular Disease

## 2020-06-14 NOTE — Telephone Encounter (Signed)
Left message to call back  

## 2020-06-14 NOTE — Telephone Encounter (Signed)
Patient is requesting to discuss Lexiscan results. Please call.

## 2020-06-14 NOTE — Telephone Encounter (Signed)
Please return call to patient's mobile number at [phone].

## 2020-06-14 NOTE — Telephone Encounter (Signed)
Pt calling for stress test results. Advised that nurse would call once MD has reviewed results. Pt verbalized understanding.

## 2020-06-14 NOTE — Telephone Encounter (Signed)
Patient is returning call.  °

## 2020-06-18 DIAGNOSIS — M79606 Pain in leg, unspecified: Secondary | ICD-10-CM | POA: Diagnosis not present

## 2020-06-18 DIAGNOSIS — E1165 Type 2 diabetes mellitus with hyperglycemia: Secondary | ICD-10-CM | POA: Diagnosis not present

## 2020-06-18 DIAGNOSIS — E7800 Pure hypercholesterolemia, unspecified: Secondary | ICD-10-CM | POA: Diagnosis not present

## 2020-06-18 DIAGNOSIS — Z6831 Body mass index (BMI) 31.0-31.9, adult: Secondary | ICD-10-CM | POA: Diagnosis not present

## 2020-06-18 DIAGNOSIS — Z299 Encounter for prophylactic measures, unspecified: Secondary | ICD-10-CM | POA: Diagnosis not present

## 2020-06-18 NOTE — Telephone Encounter (Signed)
Patient is following up regarding results. Please advise.

## 2020-06-18 NOTE — Telephone Encounter (Signed)
Spoke with patient. Patient informed results are not interpreted by [name] yet. Patient verbalized understanding. He would like results routed to his PCP Dr. Manuella Ghazi once completed as well.

## 2020-06-21 DIAGNOSIS — M47816 Spondylosis without myelopathy or radiculopathy, lumbar region: Secondary | ICD-10-CM | POA: Diagnosis not present

## 2020-06-21 DIAGNOSIS — S134XXA Sprain of ligaments of cervical spine, initial encounter: Secondary | ICD-10-CM | POA: Diagnosis not present

## 2020-06-21 DIAGNOSIS — M9901 Segmental and somatic dysfunction of cervical region: Secondary | ICD-10-CM | POA: Diagnosis not present

## 2020-06-21 DIAGNOSIS — S233XXA Sprain of ligaments of thoracic spine, initial encounter: Secondary | ICD-10-CM | POA: Diagnosis not present

## 2020-06-21 DIAGNOSIS — M9902 Segmental and somatic dysfunction of thoracic region: Secondary | ICD-10-CM | POA: Diagnosis not present

## 2020-06-21 DIAGNOSIS — M9903 Segmental and somatic dysfunction of lumbar region: Secondary | ICD-10-CM | POA: Diagnosis not present

## 2020-06-23 NOTE — Telephone Encounter (Signed)
° ° °  Pt is calling back to follow up to get stress test result

## 2020-06-23 NOTE — Telephone Encounter (Signed)
Spoke to patient myoview results given.Stated he continues to have chest tightness.No tightness at present.Advised Dr.Kelly's schedules are full.Appointment scheduled with Coletta Memos NP 11/22 at 8:45 am.I will make Dr.Kelly aware.

## 2020-06-28 DIAGNOSIS — I82401 Acute embolism and thrombosis of unspecified deep veins of right lower extremity: Secondary | ICD-10-CM | POA: Diagnosis not present

## 2020-06-28 DIAGNOSIS — M7989 Other specified soft tissue disorders: Secondary | ICD-10-CM | POA: Diagnosis not present

## 2020-07-05 ENCOUNTER — Ambulatory Visit: Payer: Medicare Other | Admitting: General Practice

## 2020-07-05 ENCOUNTER — Ambulatory Visit (INDEPENDENT_AMBULATORY_CARE_PROVIDER_SITE_OTHER): Payer: Medicare Other | Admitting: Physician Assistant

## 2020-07-05 ENCOUNTER — Other Ambulatory Visit: Payer: Self-pay

## 2020-07-05 ENCOUNTER — Encounter: Payer: Self-pay | Admitting: Physician Assistant

## 2020-07-05 VITALS — BP 130/48 | HR 63 | Ht 74.0 in | Wt 242.4 lb

## 2020-07-05 DIAGNOSIS — E119 Type 2 diabetes mellitus without complications: Secondary | ICD-10-CM

## 2020-07-05 DIAGNOSIS — I25810 Atherosclerosis of coronary artery bypass graft(s) without angina pectoris: Secondary | ICD-10-CM

## 2020-07-05 DIAGNOSIS — I10 Essential (primary) hypertension: Secondary | ICD-10-CM

## 2020-07-05 DIAGNOSIS — E785 Hyperlipidemia, unspecified: Secondary | ICD-10-CM | POA: Diagnosis not present

## 2020-07-05 DIAGNOSIS — I2510 Atherosclerotic heart disease of native coronary artery without angina pectoris: Secondary | ICD-10-CM

## 2020-07-05 DIAGNOSIS — R0789 Other chest pain: Secondary | ICD-10-CM

## 2020-07-05 NOTE — Patient Instructions (Signed)
Medication Instructions:  Continue current Medications  *If you need a refill on your cardiac medications before your next appointment, please call your pharmacy*   Lab Work: None Ordered   Testing/Procedures: None ordered3   Follow-Up: At [facility], you and your health needs are our priority.  As part of our continuing mission to provide you with exceptional heart care, we have created designated Provider Care Teams.  These Care Teams include your primary Cardiologist (physician) and Advanced Practice Providers (APPs -  Physician Assistants and Nurse Practitioners) who all work together to provide you with the care you need, when you need it.  We recommend signing up for the patient portal called "MyChart".  Sign up information is provided on this After Visit Summary.  MyChart is used to connect with patients for Virtual Visits (Telemedicine).  Patients are able to view lab/test results, encounter notes, upcoming appointments, etc.  Non-urgent messages can be sent to your provider as well.   To learn more about what you can do with MyChart, go to [email or web address].    Your next appointment:   6 month(s)  The format for your next appointment:   In Person  Provider:   You may see Shelva Majestic, MD  or one of the following Advanced Practice Providers on your designated Care Team:    Almyra Deforest, PA-C  Fabian Sharp, PA-C or   Roby Lofts, [place]

## 2020-07-05 NOTE — Progress Notes (Signed)
Cardiology Office Note:    Date:  07/07/2020   ID:  William Orr, DOB May 26, 1946, MRN [id number]  PCP:  [name], MD  [facility] Cardiologist:  Shelva Majestic, MD  [place] Electrophysiologist:  None   Referring MD: [name], MD   Chief Complaint  Patient presents with   Follow-up    seen for [name].     History of Present Illness:    William Orr is a 74 y.o. male with a hx of HTN, HLD, DM 2, mild OSA diagnosed in March 2015 and CAD. Patient was admitted for NSTEMI in May 2018 and underwent cardiac catheterization on 01/02/2017 which revealed a high-grade bifurcation lesion in proximal LAD and the diagonal, he eventually underwent CABG by Dr. [name] on 01/04/2017 with LIMA to distal LAD, SVG to D1, SVG to D2. Due to myalgia, his Crestor switched to simvastatin. Lovaza was started for hypertriglyceridemia, this was later transitioned to Vascepa, however Vascepa was not supported by his insurance. He is still on combination of Zetia and fenofibrate. He is no longer on statin. He was recently seen by [name] on 05/31/2020 for chest discomfort which appears to be atypical. [name] ordered a Myoview on 06/09/2020 that showed EF 61%, overall low risk.  Patient presents today for follow-up.  He continues to have almost daily episodes of atypical chest discomfort.  He describes the chest discomfort as 2 parts, an anterior chest discomfort that is worse with deep inspiration, and left flank discomfort that is worse with body rotation.  I suspect his symptom is either musculoskeletal pain or inflammation in the lung.  I recommended some ibuprofen to help with the inflammation.  Symptom does not seems to be cardiac in nature nor does it feels similar to the previous anginal symptom from 2018.  There has been no increase in the intensity of his chest discomfort.  He can follow-up with [name] in 6 months.  He does have a torn meniscus in his knee which limit his functional  ability.  If he does require surgery in the near future, he should be cleared from cardiac perspective.   Past Medical History:  Diagnosis Date   Diabetes mellitus without complication ([place])    History of hiatal hernia    Hyperlipidemia    Hypertension    S/P CABG x 3 01/04/2017   LIMA to LAD, Sequential SVG to D1-D2, EVH via right thigh    Past Surgical History:  Procedure Laterality Date   CORONARY ARTERY BYPASS GRAFT N/A 01/04/2017   Procedure: CORONARY ARTERY BYPASS GRAFTING (CABG)X3, ON PUMP, USING LEFT INTERNAL MAMMARY ARTERY AND RIGHT GREATER SAPHENOUS VEIN HARVESTED ENDOSCOPICALLY, LIMA-LAD, SEQ SVG- DIAG 1 -DIAG 2;  Surgeon: [name], MD;  Location: [facility];  Service: Open Heart Surgery;  Laterality: N/A;   KNEE ARTHROSCOPY Bilateral    LEFT HEART CATH AND CORONARY ANGIOGRAPHY N/A 01/02/2017   Procedure: Left Heart Cath and Coronary Angiography;  Surgeon: [name], MD;  Location: [facility];  Service: Cardiovascular;  Laterality: N/A;   TEE WITHOUT CARDIOVERSION N/A 01/04/2017   Procedure: TRANSESOPHAGEAL ECHOCARDIOGRAM (TEE);  Surgeon: [name], MD;  Location: [facility];  Service: Open Heart Surgery;  Laterality: N/A;    Current Medications: Current Meds  Medication Sig   acetaminophen (TYLENOL) 650 MG CR tablet Take 650 mg by mouth every 8 (eight) hours as needed for pain.   Alogliptin Benzoate 25 MG TABS Take 1 tablet by mouth every  morning.   amLODipine (NORVASC) 10 MG tablet Take 10 mg by mouth daily.   Apple Cider Vinegar 500 MG TABS Take 3 tablets by mouth daily.   aspirin EC 81 MG EC tablet Take 1 tablet (81 mg total) by mouth daily.   carvedilol (COREG) 25 MG tablet Take 1.5 tablet (37.5) in the AM, and 1 tablet (25 mg) in the PM (Patient taking differently: Take 1.5 tablet (37.5) in the AM, and pm)   cetirizine (ZYRTEC) 10 MG tablet Take 10 mg by mouth daily as needed for allergies.    Cholecalciferol (VITAMIN D-3) 5000 UNITS  TABS Take 10,000 Units by mouth every morning.    COPPER PO Take 1 capsule by mouth daily.   ezetimibe (ZETIA) 10 MG tablet Take 1 tablet (10 mg total) by mouth daily.   fenofibrate (TRICOR) 145 MG tablet Take 1 tablet (145 mg total) by mouth daily. (Patient taking differently: Take 145 mg by mouth every evening. )   fluticasone (FLONASE) 50 MCG/ACT nasal spray Place 1 spray into both nostrils daily as needed (seasonal allergies).    hydrochlorothiazide (HYDRODIURIL) 25 MG tablet    losartan (COZAAR) 100 MG tablet Take 1 tablet (100 mg total) by mouth daily.   magnesium oxide (MAG-OX) 400 MG tablet Take 400 mg by mouth every morning.   metFORMIN (GLUCOPHAGE) 850 MG tablet Take 850 mg by mouth 2 (two) times daily with a meal.    Misc Natural Products (TURMERIC CURCUMIN) CAPS Take by mouth 2 (two) times daily.   Multiple Vitamin (MULTIVITAMIN WITH MINERALS) TABS tablet Take 2 tablets by mouth every morning.    NON FORMULARY Take 2 capsules by mouth daily. Amazin Grape: MUscadine Grapeseed/Skin   Omega-3 Fatty Acids (OMEGA-3 FISH OIL PO) Take 2 capsules by mouth daily.   OVER THE COUNTER MEDICATION daily. Blood boost   pantoprazole (PROTONIX) 40 MG tablet Take 1 tablet by mouth every morning.    zinc gluconate 50 MG tablet Take 50 mg by mouth daily.     Allergies:   Jardiance [empagliflozin], Lisinopril, and Statins   Social History   Socioeconomic History   Marital status: Married    Spouse name: Not on file   Number of children: Not on file   Years of education: Not on file   Highest education level: Not on file  Occupational History   Not on file  Tobacco Use   Smoking status: Former Smoker    Types: Pipe   Smokeless tobacco: Never Used   Tobacco comment: quit smoking pipe 30 years ago.  Vaping Use   Vaping Use: Never used  Substance and Sexual Activity   Alcohol use: No   Drug use: No   Sexual activity: Not Currently  Other Topics Concern   Not  on file  Social History Narrative   Not on file   Social Determinants of Health   Financial Resource Strain:    Difficulty of Paying Living Expenses: Not on file  Food Insecurity:    Worried About [place] in the Last Year: Not on file   Ran Out of Food in the Last Year: Not on file  Transportation Needs:    Lack of Transportation (Medical): Not on file   Lack of Transportation (Non-Medical): Not on file  Physical Activity:    Days of Exercise per Week: Not on file   Minutes of Exercise per Session: Not on file  Stress:    Feeling of Stress : Not on file  Social Connections:    Frequency of Communication with Friends and Family: Not on file   Frequency of Social Gatherings with Friends and Family: Not on file   Attends Religious Services: Not on Electrical engineer or Organizations: Not on file   Attends Archivist Meetings: Not on file   Marital Status: Not on file     Family History: The patient's family history includes Arthritis in his brother; Cancer - Other in his brother; Cancer - Prostate in his father; Diabetes in his brother and mother; Diabetes type II in his son; Rheum arthritis in his brother and sister.  ROS:   Please see the history of present illness.     All other systems reviewed and are negative.  EKGs/Labs/Other Studies Reviewed:    The following studies were reviewed today:  Myoview 06/09/2020  Nuclear stress EF: 61%.  The left ventricular ejection fraction is normal (55-65%).  There was no ST segment deviation noted during stress.  The study is normal.  This is a low risk study.   EKG:  EKG is ordered today.  The ekg ordered today demonstrates normal sinus rhythm, occasional PVCs, no significant ST-T wave changes.  Recent Labs: 10/27/2019: ALT 11; BUN 18; Creatinine, Ser 1.06; Hemoglobin 12.9; Magnesium 1.8; Platelets 247; Potassium 4.7; Sodium 138; TSH 2.580  Recent Lipid Panel    Component  Value Date/Time   CHOL 101 10/27/2019 1052   TRIG 181 (H) 10/27/2019 1052   HDL 25 (L) 10/27/2019 1052   CHOLHDL 4.0 10/27/2019 1052   CHOLHDL 4.5 01/01/2017 0042   VLDL UNABLE TO CALCULATE IF TRIGLYCERIDE OVER 400 mg/dL 01/01/2017 0042   LDLCALC 46 10/27/2019 1052     Risk Assessment/Calculations:       Physical Exam:    VS:  BP (!) 130/48    Pulse 63    Ht 6\' 2"  (1.88 m)    Wt 242 lb 6.4 oz (110 kg)    SpO2 96%    BMI 31.12 kg/m     Wt Readings from Last 3 Encounters:  07/05/20 242 lb 6.4 oz (110 kg)  06/09/20 242 lb (109.8 kg)  05/31/20 242 lb (109.8 kg)     GEN:  Well nourished, well developed in no acute distress HEENT: Normal NECK: No JVD; No carotid bruits LYMPHATICS: No lymphadenopathy CARDIAC: RRR, no murmurs, rubs, gallops RESPIRATORY:  Clear to auscultation without rales, wheezing or rhonchi  ABDOMEN: Soft, non-tender, non-distended MUSCULOSKELETAL:  No edema; No deformity  SKIN: Warm and dry NEUROLOGIC:  Alert and oriented x 3 PSYCHIATRIC:  Normal affect   ASSESSMENT:    1. Atypical chest pain   2. Coronary artery disease involving native coronary artery of native heart without angina pectoris   3. Essential hypertension   4. Hyperlipidemia with target LDL less than 70   5. Controlled type 2 diabetes mellitus without complication, without long-term current use of insulin (HCC)    PLAN:    In order of problems listed above:  1. Atypical chest pain: I recommended some ibuprofen for his chest discomfort.  Recent Myoview was low risk.  2. CAD: Continue aspirin and carvedilol  3. Hypertension: Continue on current therapy.  Blood pressure well controlled  4. Hyperlipidemia: On Zetia and fenofibrate  5. DM2: Managed by primary care provider    Shared Decision Making/Informed Consent        Medication Adjustments/Labs and Tests Ordered: Current medicines are reviewed at length with the patient today.  Concerns regarding medicines are outlined  above.  Orders Placed This Encounter  Procedures   EKG 12-Lead   No orders of the defined types were placed in this encounter.   Patient Instructions  Medication Instructions:  Continue current Medications  *If you need a refill on your cardiac medications before your next appointment, please call your pharmacy*   Lab Work: None Ordered   Testing/Procedures: None ordered3   Follow-Up: At [facility], you and your health needs are our priority.  As part of our continuing mission to provide you with exceptional heart care, we have created designated Provider Care Teams.  These Care Teams include your primary Cardiologist (physician) and Advanced Practice Providers (APPs -  Physician Assistants and Nurse Practitioners) who all work together to provide you with the care you need, when you need it.  We recommend signing up for the patient portal called "MyChart".  Sign up information is provided on this After Visit Summary.  MyChart is used to connect with patients for Virtual Visits (Telemedicine).  Patients are able to view lab/test results, encounter notes, upcoming appointments, etc.  Non-urgent messages can be sent to your provider as well.   To learn more about what you can do with MyChart, go to [email or web address].    Your next appointment:   6 month(s)  The format for your next appointment:   In Person  Provider:   You may see Shelva Majestic, MD  or one of the following Advanced Practice Providers on your designated Care Team:    Almyra Deforest, PA-C  Fabian Sharp, PA-C or   Roby Lofts, PA-C         Signed, [place], [place]  07/07/2020 10:55 PM    [place]

## 2020-07-07 ENCOUNTER — Encounter: Payer: Self-pay | Admitting: Physician Assistant

## 2020-07-07 DIAGNOSIS — Z23 Encounter for immunization: Secondary | ICD-10-CM | POA: Diagnosis not present

## 2020-07-13 DIAGNOSIS — E7800 Pure hypercholesterolemia, unspecified: Secondary | ICD-10-CM | POA: Diagnosis not present

## 2020-07-13 DIAGNOSIS — I10 Essential (primary) hypertension: Secondary | ICD-10-CM | POA: Diagnosis not present

## 2020-07-13 DIAGNOSIS — E119 Type 2 diabetes mellitus without complications: Secondary | ICD-10-CM | POA: Diagnosis not present

## 2020-07-15 DIAGNOSIS — Z23 Encounter for immunization: Secondary | ICD-10-CM | POA: Diagnosis not present

## 2020-07-21 DIAGNOSIS — L609 Nail disorder, unspecified: Secondary | ICD-10-CM | POA: Diagnosis not present

## 2020-07-21 DIAGNOSIS — M9902 Segmental and somatic dysfunction of thoracic region: Secondary | ICD-10-CM | POA: Diagnosis not present

## 2020-07-21 DIAGNOSIS — E1140 Type 2 diabetes mellitus with diabetic neuropathy, unspecified: Secondary | ICD-10-CM | POA: Diagnosis not present

## 2020-07-21 DIAGNOSIS — S134XXA Sprain of ligaments of cervical spine, initial encounter: Secondary | ICD-10-CM | POA: Diagnosis not present

## 2020-07-21 DIAGNOSIS — M9901 Segmental and somatic dysfunction of cervical region: Secondary | ICD-10-CM | POA: Diagnosis not present

## 2020-07-21 DIAGNOSIS — L110 Acquired keratosis follicularis: Secondary | ICD-10-CM | POA: Diagnosis not present

## 2020-07-21 DIAGNOSIS — S233XXA Sprain of ligaments of thoracic spine, initial encounter: Secondary | ICD-10-CM | POA: Diagnosis not present

## 2020-07-21 DIAGNOSIS — M9903 Segmental and somatic dysfunction of lumbar region: Secondary | ICD-10-CM | POA: Diagnosis not present

## 2020-07-21 DIAGNOSIS — M47816 Spondylosis without myelopathy or radiculopathy, lumbar region: Secondary | ICD-10-CM | POA: Diagnosis not present

## 2020-07-29 ENCOUNTER — Ambulatory Visit: Payer: Medicare Other | Admitting: Physician Assistant

## 2020-08-12 DIAGNOSIS — E119 Type 2 diabetes mellitus without complications: Secondary | ICD-10-CM | POA: Diagnosis not present

## 2020-08-12 DIAGNOSIS — E7800 Pure hypercholesterolemia, unspecified: Secondary | ICD-10-CM | POA: Diagnosis not present

## 2020-08-12 DIAGNOSIS — I10 Essential (primary) hypertension: Secondary | ICD-10-CM | POA: Diagnosis not present

## 2020-08-19 DIAGNOSIS — M47816 Spondylosis without myelopathy or radiculopathy, lumbar region: Secondary | ICD-10-CM | POA: Diagnosis not present

## 2020-08-19 DIAGNOSIS — M9901 Segmental and somatic dysfunction of cervical region: Secondary | ICD-10-CM | POA: Diagnosis not present

## 2020-08-19 DIAGNOSIS — M9902 Segmental and somatic dysfunction of thoracic region: Secondary | ICD-10-CM | POA: Diagnosis not present

## 2020-08-19 DIAGNOSIS — S233XXA Sprain of ligaments of thoracic spine, initial encounter: Secondary | ICD-10-CM | POA: Diagnosis not present

## 2020-08-19 DIAGNOSIS — S134XXA Sprain of ligaments of cervical spine, initial encounter: Secondary | ICD-10-CM | POA: Diagnosis not present

## 2020-08-19 DIAGNOSIS — M9903 Segmental and somatic dysfunction of lumbar region: Secondary | ICD-10-CM | POA: Diagnosis not present

## 2020-08-25 ENCOUNTER — Telehealth: Payer: Self-pay | Admitting: *Deleted

## 2020-08-25 NOTE — Telephone Encounter (Signed)
Returned a call to the patient to confirm that his request for his 2015 sleep study to the [place] has been done. He is not sure. I told him that I will fax this to the number that he provided. There isn't a note that it has been done. Sleep study faxed to the [place] per fax number provided by the patient. [phone].

## 2020-09-13 DIAGNOSIS — I10 Essential (primary) hypertension: Secondary | ICD-10-CM | POA: Diagnosis not present

## 2020-09-22 DIAGNOSIS — M9903 Segmental and somatic dysfunction of lumbar region: Secondary | ICD-10-CM | POA: Diagnosis not present

## 2020-09-22 DIAGNOSIS — R030 Elevated blood-pressure reading, without diagnosis of hypertension: Secondary | ICD-10-CM | POA: Diagnosis not present

## 2020-09-22 DIAGNOSIS — Z789 Other specified health status: Secondary | ICD-10-CM | POA: Diagnosis not present

## 2020-09-22 DIAGNOSIS — M47816 Spondylosis without myelopathy or radiculopathy, lumbar region: Secondary | ICD-10-CM | POA: Diagnosis not present

## 2020-09-22 DIAGNOSIS — M9902 Segmental and somatic dysfunction of thoracic region: Secondary | ICD-10-CM | POA: Diagnosis not present

## 2020-09-22 DIAGNOSIS — K219 Gastro-esophageal reflux disease without esophagitis: Secondary | ICD-10-CM | POA: Diagnosis not present

## 2020-09-22 DIAGNOSIS — I10 Essential (primary) hypertension: Secondary | ICD-10-CM | POA: Diagnosis not present

## 2020-09-22 DIAGNOSIS — M9901 Segmental and somatic dysfunction of cervical region: Secondary | ICD-10-CM | POA: Diagnosis not present

## 2020-09-22 DIAGNOSIS — E1165 Type 2 diabetes mellitus with hyperglycemia: Secondary | ICD-10-CM | POA: Diagnosis not present

## 2020-09-22 DIAGNOSIS — I472 Ventricular tachycardia: Secondary | ICD-10-CM | POA: Diagnosis not present

## 2020-09-22 DIAGNOSIS — F17210 Nicotine dependence, cigarettes, uncomplicated: Secondary | ICD-10-CM | POA: Diagnosis not present

## 2020-09-22 DIAGNOSIS — Z6831 Body mass index (BMI) 31.0-31.9, adult: Secondary | ICD-10-CM | POA: Diagnosis not present

## 2020-09-22 DIAGNOSIS — S134XXA Sprain of ligaments of cervical spine, initial encounter: Secondary | ICD-10-CM | POA: Diagnosis not present

## 2020-09-22 DIAGNOSIS — S233XXA Sprain of ligaments of thoracic spine, initial encounter: Secondary | ICD-10-CM | POA: Diagnosis not present

## 2020-09-22 DIAGNOSIS — Z299 Encounter for prophylactic measures, unspecified: Secondary | ICD-10-CM | POA: Diagnosis not present

## 2020-10-11 DIAGNOSIS — K219 Gastro-esophageal reflux disease without esophagitis: Secondary | ICD-10-CM | POA: Diagnosis not present

## 2020-10-11 DIAGNOSIS — E7800 Pure hypercholesterolemia, unspecified: Secondary | ICD-10-CM | POA: Diagnosis not present

## 2020-10-11 DIAGNOSIS — I10 Essential (primary) hypertension: Secondary | ICD-10-CM | POA: Diagnosis not present

## 2020-10-11 DIAGNOSIS — E1165 Type 2 diabetes mellitus with hyperglycemia: Secondary | ICD-10-CM | POA: Diagnosis not present

## 2020-10-13 DIAGNOSIS — E1140 Type 2 diabetes mellitus with diabetic neuropathy, unspecified: Secondary | ICD-10-CM | POA: Diagnosis not present

## 2020-10-13 DIAGNOSIS — L110 Acquired keratosis follicularis: Secondary | ICD-10-CM | POA: Diagnosis not present

## 2020-10-13 DIAGNOSIS — L609 Nail disorder, unspecified: Secondary | ICD-10-CM | POA: Diagnosis not present

## 2020-10-22 DIAGNOSIS — S233XXA Sprain of ligaments of thoracic spine, initial encounter: Secondary | ICD-10-CM | POA: Diagnosis not present

## 2020-10-22 DIAGNOSIS — M9902 Segmental and somatic dysfunction of thoracic region: Secondary | ICD-10-CM | POA: Diagnosis not present

## 2020-10-22 DIAGNOSIS — M9901 Segmental and somatic dysfunction of cervical region: Secondary | ICD-10-CM | POA: Diagnosis not present

## 2020-10-22 DIAGNOSIS — S134XXA Sprain of ligaments of cervical spine, initial encounter: Secondary | ICD-10-CM | POA: Diagnosis not present

## 2020-10-22 DIAGNOSIS — M47816 Spondylosis without myelopathy or radiculopathy, lumbar region: Secondary | ICD-10-CM | POA: Diagnosis not present

## 2020-10-22 DIAGNOSIS — M9903 Segmental and somatic dysfunction of lumbar region: Secondary | ICD-10-CM | POA: Diagnosis not present

## 2020-11-03 DIAGNOSIS — Z789 Other specified health status: Secondary | ICD-10-CM | POA: Diagnosis not present

## 2020-11-03 DIAGNOSIS — Z299 Encounter for prophylactic measures, unspecified: Secondary | ICD-10-CM | POA: Diagnosis not present

## 2020-11-03 DIAGNOSIS — K219 Gastro-esophageal reflux disease without esophagitis: Secondary | ICD-10-CM | POA: Diagnosis not present

## 2020-11-03 DIAGNOSIS — Z6831 Body mass index (BMI) 31.0-31.9, adult: Secondary | ICD-10-CM | POA: Diagnosis not present

## 2020-11-03 DIAGNOSIS — I472 Ventricular tachycardia: Secondary | ICD-10-CM | POA: Diagnosis not present

## 2020-11-03 DIAGNOSIS — I10 Essential (primary) hypertension: Secondary | ICD-10-CM | POA: Diagnosis not present

## 2020-11-03 DIAGNOSIS — E1165 Type 2 diabetes mellitus with hyperglycemia: Secondary | ICD-10-CM | POA: Diagnosis not present

## 2020-11-11 DIAGNOSIS — M549 Dorsalgia, unspecified: Secondary | ICD-10-CM | POA: Diagnosis not present

## 2020-11-11 DIAGNOSIS — I700 Atherosclerosis of aorta: Secondary | ICD-10-CM | POA: Diagnosis not present

## 2020-11-11 DIAGNOSIS — R0789 Other chest pain: Secondary | ICD-10-CM | POA: Diagnosis not present

## 2020-11-11 DIAGNOSIS — I2510 Atherosclerotic heart disease of native coronary artery without angina pectoris: Secondary | ICD-10-CM | POA: Diagnosis not present

## 2020-11-11 DIAGNOSIS — E119 Type 2 diabetes mellitus without complications: Secondary | ICD-10-CM | POA: Diagnosis not present

## 2020-11-11 DIAGNOSIS — I10 Essential (primary) hypertension: Secondary | ICD-10-CM | POA: Diagnosis not present

## 2020-11-11 DIAGNOSIS — R9431 Abnormal electrocardiogram [ECG] [EKG]: Secondary | ICD-10-CM | POA: Diagnosis not present

## 2020-11-11 DIAGNOSIS — Z96653 Presence of artificial knee joint, bilateral: Secondary | ICD-10-CM | POA: Diagnosis not present

## 2020-11-11 DIAGNOSIS — E785 Hyperlipidemia, unspecified: Secondary | ICD-10-CM | POA: Diagnosis not present

## 2020-11-11 DIAGNOSIS — Z87891 Personal history of nicotine dependence: Secondary | ICD-10-CM | POA: Diagnosis not present

## 2020-11-11 DIAGNOSIS — Z951 Presence of aortocoronary bypass graft: Secondary | ICD-10-CM | POA: Diagnosis not present

## 2020-11-11 DIAGNOSIS — R001 Bradycardia, unspecified: Secondary | ICD-10-CM | POA: Diagnosis not present

## 2020-11-11 DIAGNOSIS — R079 Chest pain, unspecified: Secondary | ICD-10-CM | POA: Diagnosis not present

## 2020-11-11 DIAGNOSIS — E7800 Pure hypercholesterolemia, unspecified: Secondary | ICD-10-CM | POA: Diagnosis not present

## 2020-11-11 DIAGNOSIS — I440 Atrioventricular block, first degree: Secondary | ICD-10-CM | POA: Diagnosis not present

## 2020-11-11 DIAGNOSIS — Z9889 Other specified postprocedural states: Secondary | ICD-10-CM | POA: Diagnosis not present

## 2020-11-19 DIAGNOSIS — I700 Atherosclerosis of aorta: Secondary | ICD-10-CM | POA: Diagnosis not present

## 2020-11-19 DIAGNOSIS — Z299 Encounter for prophylactic measures, unspecified: Secondary | ICD-10-CM | POA: Diagnosis not present

## 2020-11-19 DIAGNOSIS — I10 Essential (primary) hypertension: Secondary | ICD-10-CM | POA: Diagnosis not present

## 2020-11-19 DIAGNOSIS — Z6831 Body mass index (BMI) 31.0-31.9, adult: Secondary | ICD-10-CM | POA: Diagnosis not present

## 2020-11-19 DIAGNOSIS — M9901 Segmental and somatic dysfunction of cervical region: Secondary | ICD-10-CM | POA: Diagnosis not present

## 2020-11-19 DIAGNOSIS — M9903 Segmental and somatic dysfunction of lumbar region: Secondary | ICD-10-CM | POA: Diagnosis not present

## 2020-11-19 DIAGNOSIS — M9902 Segmental and somatic dysfunction of thoracic region: Secondary | ICD-10-CM | POA: Diagnosis not present

## 2020-11-19 DIAGNOSIS — E1165 Type 2 diabetes mellitus with hyperglycemia: Secondary | ICD-10-CM | POA: Diagnosis not present

## 2020-11-19 DIAGNOSIS — I472 Ventricular tachycardia: Secondary | ICD-10-CM | POA: Diagnosis not present

## 2020-11-19 DIAGNOSIS — S134XXA Sprain of ligaments of cervical spine, initial encounter: Secondary | ICD-10-CM | POA: Diagnosis not present

## 2020-11-19 DIAGNOSIS — S233XXA Sprain of ligaments of thoracic spine, initial encounter: Secondary | ICD-10-CM | POA: Diagnosis not present

## 2020-11-19 DIAGNOSIS — M47816 Spondylosis without myelopathy or radiculopathy, lumbar region: Secondary | ICD-10-CM | POA: Diagnosis not present

## 2020-12-10 DIAGNOSIS — I10 Essential (primary) hypertension: Secondary | ICD-10-CM | POA: Diagnosis not present

## 2020-12-11 DIAGNOSIS — I10 Essential (primary) hypertension: Secondary | ICD-10-CM | POA: Diagnosis not present

## 2020-12-11 DIAGNOSIS — E1165 Type 2 diabetes mellitus with hyperglycemia: Secondary | ICD-10-CM | POA: Diagnosis not present

## 2020-12-11 DIAGNOSIS — K219 Gastro-esophageal reflux disease without esophagitis: Secondary | ICD-10-CM | POA: Diagnosis not present

## 2020-12-11 DIAGNOSIS — E7800 Pure hypercholesterolemia, unspecified: Secondary | ICD-10-CM | POA: Diagnosis not present

## 2020-12-23 DIAGNOSIS — E7800 Pure hypercholesterolemia, unspecified: Secondary | ICD-10-CM | POA: Diagnosis not present

## 2020-12-23 DIAGNOSIS — Z299 Encounter for prophylactic measures, unspecified: Secondary | ICD-10-CM | POA: Diagnosis not present

## 2020-12-23 DIAGNOSIS — E1165 Type 2 diabetes mellitus with hyperglycemia: Secondary | ICD-10-CM | POA: Diagnosis not present

## 2020-12-23 DIAGNOSIS — U071 COVID-19: Secondary | ICD-10-CM | POA: Diagnosis not present

## 2020-12-23 DIAGNOSIS — I10 Essential (primary) hypertension: Secondary | ICD-10-CM | POA: Diagnosis not present

## 2020-12-23 DIAGNOSIS — Z6831 Body mass index (BMI) 31.0-31.9, adult: Secondary | ICD-10-CM | POA: Diagnosis not present

## 2020-12-31 DIAGNOSIS — Z20822 Contact with and (suspected) exposure to covid-19: Secondary | ICD-10-CM | POA: Diagnosis not present

## 2021-01-05 ENCOUNTER — Ambulatory Visit (INDEPENDENT_AMBULATORY_CARE_PROVIDER_SITE_OTHER): Payer: Medicare Other | Admitting: Cardiovascular Disease

## 2021-01-05 ENCOUNTER — Encounter: Payer: Self-pay | Admitting: Cardiovascular Disease

## 2021-01-05 ENCOUNTER — Other Ambulatory Visit: Payer: Self-pay

## 2021-01-05 VITALS — BP 134/58 | HR 58 | Ht 74.0 in | Wt 240.0 lb

## 2021-01-05 DIAGNOSIS — E782 Mixed hyperlipidemia: Secondary | ICD-10-CM | POA: Diagnosis not present

## 2021-01-05 DIAGNOSIS — I2510 Atherosclerotic heart disease of native coronary artery without angina pectoris: Secondary | ICD-10-CM | POA: Diagnosis not present

## 2021-01-05 DIAGNOSIS — G4733 Obstructive sleep apnea (adult) (pediatric): Secondary | ICD-10-CM | POA: Diagnosis not present

## 2021-01-05 DIAGNOSIS — Z951 Presence of aortocoronary bypass graft: Secondary | ICD-10-CM | POA: Diagnosis not present

## 2021-01-05 DIAGNOSIS — R0789 Other chest pain: Secondary | ICD-10-CM | POA: Diagnosis not present

## 2021-01-05 DIAGNOSIS — Z9989 Dependence on other enabling machines and devices: Secondary | ICD-10-CM

## 2021-01-05 DIAGNOSIS — E669 Obesity, unspecified: Secondary | ICD-10-CM | POA: Diagnosis not present

## 2021-01-05 NOTE — Patient Instructions (Signed)

## 2021-01-05 NOTE — Progress Notes (Signed)
Patient ID: William Orr, male   DOB: November 29, 1945, 75 y.o.   MRN: [id number]    Primary M.D.: Dr.Ashish Lenord Orr  HPI: [name] is a 75 year old African American male who presents to the office for a 7 month follow-up cardiology/sleep evaluation.  [name]  has a long history of hypertension,  type 2 diabetes mellitus, and hyperlipidemia. He  underwent an exercise nuclear study in [place] for atypical chest pain which revealed normal perfusion with an ejection fraction of 61%. There was no evidence for ischemia and he had normal wall motion. He was hypertensive throughout the study.  He has been maintained on blood pressure medicines consisting of diltiazem 300 mg daily, carvedilol 25 mg twice a day, and losartan HCT 100/25 mg. He previously, his had taken simvastatin 20 mg for hyperlipidemia  but self discontinued this.  Recently, he has been taking over-the-counter supplements with fair amount of omega-3 fatty acids for elevated triglycerides.[name] He takes Glucophage 850 mg in the morning and 2 tablets at night.   He has had some instances of atypical, probable musculoskeletal chest pain which improved with nonsteroidal therapy.  He underwent a 2-D echo Doppler study on 09/25/2013 and this revealed an ejection fraction of 60-65% with mild focal basal hypertrophy of the septum had normal diastolic parameters. There was moderate thickening of the trileaflet aortic valve consistent with aortic valve sclerosis. He had mild left atrial dilatation. His right ventricle was upper normal to mildly dilated.    Due to my concerns that he may have significant obstructive sleep apnea he underwent a diagnostic polysomnogram in March 2015 which revealed mild sleep apnea overall, with an AHI of 5.8, but he had moderate sleep apnea during REM sleep with an AHI of 25/hr.  He also had significant oxygen desaturation to 84% with non-REM sleep and 76% with REM sleep and had evidence for very loud snoring.  His  periodic limb movement index was 6.7 per hour with 9.5 per hour to arousal.  He underwent CPAP titration and 11 cm pressure was recommended.  He has a ResMed AirSense10 AutoSet.  A download from 03/11/2014 through 04/09/2014 revealed that he is meeting Medicare compliance with 100% days of the device usage.  97% of the time was used at greater than 4 hours.  With his current setting, AHI is excellent at 1.0.  His were index was 0.4.  He's using a fullface mask.  His DME company is [employer] in [place], [place].  Subjectively, since initiating therapy.  He feels markedly improved.  He denies daytime sleepiness.  He denies breakthrough snoring.  His sleep is more restorative.  He has more energy.  He denies hypersomnolence.  When I saw him in 75,  he  continued to use CPAP but occasional night he would fall asleep before putting on his CPAP unit.  He does not sleep as well most nights and oftentimes wakes up several more times to go to the bathroom. Recently, he has noticed that he may experience some increased palpitations in the morning.  He has not been able to correlate if this is related to potential night sweats.   A download was obtained from 09/27/2015 through 12/25/2015.  This showed 77% of days of use with 69% of the days greater than 4 hours.  He was averaging 5 hours and 42 minutes of CPAP use.  His AHI at 11 cm water pressure remained excellent at 0.7.    A 2-D echo Doppler study revealed an EF of 60-65%.  There was grade 2 diastolic dysfunction.  There was aortic valve sclerosis without stenosis or regurgitation.  There was suggestion that there was mildly reduced RV function.  He was admitted to [facility] with unstable angina and underwent catheterization on 01/02/2017 in the setting of a non-ST segment MI.  He was found to have multifocal high-grade disease with complex bifurcation stenosis of his proximal LAD and diagonal.  He underwent CABG revascularization surgery by Dr. [name] on  01/04/2017 and had a LIMA placed to his distal LAD, vein graft to the first diagonal vessel, and vein graft to the second diagonal vessel.  He was seen in follow-up by William Orr, PAC.  He had significant hypertriglyceridemia and was started on lovaza and due to myalgias with Crestor statin therapy was changed to simvastatin, which he has tolerated.  He completed 8 weeks of cardiac rehabilitation with his last exercise this morning.  Typically his blood pressure at cardiac rehabilitation systolically is in the 226J to 130s.  He denies any recurrent chest pain or palpitations.  He continues to use CPAP with 100% compliance with [employer] as his DME company.  He's not had a recent download.    When I saw him in September 2018.  His blood pressure was elevated on losartan 100 mg, diltiazem 3 mg, and carvedilol 25 mg twice a day.  He was taken [place] X3 omega-3 fatty acid, which included 1.125 g of EPA and to 75 mg of DHA.  Lipid studies were improved.  Triglycerides were still elevated at 340, down from 413.  Fenofibrate 145 to his medical regimen.  Over the past several months, he has felt well.  He's been without chest pain.  He uses CPAP with 100% compliance.  He denies chest pain, PND, orthopnea.  He saw Dr. Brigitte Orr and his hemoglobin A1c was 6.0.  He is off statins due to statin intolerance.    When I saw him in 2019 hisblood pressure was stable.  He was not having any anginal symptoms.  Is bradycardic and I reduced his low-dose. He was  continuing to use CPAP.  He was in need to undergo endoscopy scheduled for April 24, 2018 and clearance was necessary to hold his aspirin and Plavix.  He denies any recurrent anginal symptoms.  He continues to use CPAP.  A download was obtained in April 19 through Dec 29, 2017.  DME company is [employer] in [place] and apparently we were unable to access his information beyond May 18.  However he still admits to 100% compliance.  On that download he was continuing to  meet compliance standards.  CPAP pressure is set at 11 cm of water.  AHI is excellent at 0.7.    When I saw him in June 2019 I discussed improved sleep duration subsequently he has now been wearing CPAP all night.  Previously he used to fall asleep without it and then put it on in the middle of the night.  I last saw him in October 2019.  At that time he had improved sleep duration and was he now is using CPAP almost 8 hours per night.  A new download was obtained from April 23, 2018 through May 22, 2018.  He is 100% compliant.  At a set pressure of 11's 11 cm, AHI is excellent at 0.4.  He does not have any leak.  He has more energy.  He is sleeping much better.  His previous 3+ nocturia has essentially vanished.  He will admits  to some fatigue and being tired.  Orr is been in the low 50s.  During that evaluation, I recommended discontinuance of diltiazem in light of his bradycardia and he is now been on amlodipine which was titrated to 7.5 mg.  This was subsequently titrated further to 10 mg.  He had seen William Orr in late December 2019 following an emergency room evaluation of August 03, 2018.  During his ER evaluation he had complaints of palpitations with vague chest pain.  His ECG showed sinus rhythm with ventricular trigeminy and prolonged PR interval.  He was started on Imdur but stopped taking the Imdur secondary to development of significant headache.  He underwent a Zio patch monitor for 2 weeks.  This showed predominant sinus rhythm with average heart rate 67 bpm.  There was one episode of supraventricular tachycardia which lasted 4 beats at a rate of 139.  He had isolated PACs.  There were rare atrial couplets.  He had isolated PVCs which were frequent with rare episodes of ventricular couplet and one ventricular triplet but had some episodes of ventricular bigeminy and trigeminy.   I saw him in September 2020 since his prior February 2020, he continued to feel well.  He denied  exertional chest pain symptomatology but admitted to a rare nonexertional chest wall sensation. He has continued to be on carvedilol 25 mg twice a day, amlodipine 10 mg and losartan 100 mg for hypertension.  He is diabetic on metformin.  He is on Zetia and fenofibrate for his mixed hyperlipidemia.  Laboratory on April 16, 2019 showed a total cholesterol 119 LDL cholesterol 58 but triglycerides although improved was still elevated at 215 with HDL of 26.  He continues to use CPAP with 1/2% compliance.  A download was obtained in the office today from August 12 through April 24, 2019 which confirms 100% compliance with average usage at 7 hours and 6 minutes.  He is set at 11 cm water pressure and AHI is excellent at 0.7.    When I saw him in March 2021 he remained stable from a cardiovascular standpoint and was without angina symptomatology.  At times he noted a pinching sensation at different sites in his chest wall.  He also experienced a rare sharp twinge of chest pain. He continued to use CPAP with excellent compliance and denies residual daytime sleepiness.  A download was obtained in the office today from February 16 through October 29, 2019.  Compliance is excellent with average usage 7 hours and 21 minutes.  At his 11 cm set pressure, AHI is excellent at 0.3.  There is no significant mask leak.  His DME company is [employer] in [place].  An Epworth Sleepiness Scale score was calculated in the office today this endorsed at 2 arguing against residual daytime sleepiness.  He had undergone repeat laboratory on October 27, 2019.  Lipids were improved with total cholesterol 101 and LDL cholesterol was excellent at 46.  However triglycerides remain slightly elevated at 181 with a low HDL at 25.    I last saw him in October 2021 and since his prior evaluation he was maintaining sinus rhythm and has a cardio mobile device.  He had some occasional nonexertional chest sensations.  He is able to cut the grass  and perform weed eating without difficulty.  He has a torn meniscus in his right knee.  He was diagnosed with benign positional vertigo.  He was concerned about his atypical chest pain.  He continued to  use CPAP therapy and a download in the office revealed 10 percent compliance.  Apparently, last data obtained was May 28.  He denied getting some supplies from his DME company and he continues to admit to 100% use but he may no longer be linked to our office since I was unable to obtain any data from May 28.   He was evaluated by William Deforest, PA in November 2021 and was having frequent atypical chest pain for which ibuprofen was recommended.  Recent Myoview study from June 09, 2020 was low risk and showed an EF of 61%.  He is followed at the [facility] who now manages his CPAP.  He had undergone laboratory at the [facility] which showed a creatinine of 0.96.  He had normal LFTs.  TSH 1.89.  Vitamin D level 64, hemoglobin A1c 7.0.  He tested positive for COVID on May 11 and stayed at home and was treated with Paxil elevated twice a day for 5 days by Dr. Manuella Ghazi in [place].  His symptoms were cough, runny nose, and body aches without fever.  Presently he feels well and presents for follow-up Cardiologic evaluation.  Past Medical History:  Diagnosis Date  . Diabetes mellitus without complication ([place])   . History of hiatal hernia   . Hyperlipidemia   . Hypertension   . S/P CABG x 3 01/04/2017   LIMA to LAD, Sequential SVG to D1-D2, EVH via right thigh    Past Surgical History:  Procedure Laterality Date  . CORONARY ARTERY BYPASS GRAFT N/A 01/04/2017   Procedure: CORONARY ARTERY BYPASS GRAFTING (CABG)X3, ON PUMP, USING LEFT INTERNAL MAMMARY ARTERY AND RIGHT GREATER SAPHENOUS VEIN HARVESTED ENDOSCOPICALLY, LIMA-LAD, SEQ SVG- DIAG 1 -DIAG 2;  Surgeon: William Alberts, MD;  Location: [facility];  Service: Open Heart Surgery;  Laterality: N/A;  . KNEE ARTHROSCOPY Bilateral   . LEFT HEART CATH AND CORONARY ANGIOGRAPHY N/A 01/02/2017    Procedure: Left Heart Cath and Coronary Angiography;  Surgeon: William Crome, MD;  Location: [facility];  Service: Cardiovascular;  Laterality: N/A;  . TEE WITHOUT CARDIOVERSION N/A 01/04/2017   Procedure: TRANSESOPHAGEAL ECHOCARDIOGRAM (TEE);  Surgeon: William Alberts, MD;  Location: [facility];  Service: Open Heart Surgery;  Laterality: N/A;    Allergies  Allergen Reactions  . Jardiance [Empagliflozin] Shortness Of Breath, Rash and Cough  . Lisinopril Cough  . Statins     Muscle pain     Current Outpatient Medications  Medication Sig Dispense Refill  . acetaminophen (TYLENOL) 650 MG CR tablet Take 650 mg by mouth every 8 (eight) hours as needed for pain.    . Alogliptin Benzoate 25 MG TABS Take 1 tablet by mouth every morning.    [name] amLODipine (NORVASC) 10 MG tablet Take 10 mg by mouth daily.    [name] Apple Cider Vinegar 500 MG TABS Take 3 tablets by mouth daily.    [name] aspirin EC 81 MG EC tablet Take 1 tablet (81 mg total) by mouth daily.    . carvedilol (COREG) 25 MG tablet Take 1.5 tablet (37.5) in the AM, and 1 tablet (25 mg) in the PM (Patient taking differently: Take 1.5 tablet (37.5) in the AM, and pm) 135 tablet 3  . cetirizine (ZYRTEC) 10 MG tablet Take 10 mg by mouth daily as needed for allergies.     . Cholecalciferol (VITAMIN D-3) 5000 UNITS TABS Take 10,000 Units by mouth every morning.     [name] COPPER PO Take 1 capsule by  mouth daily.    [name] ezetimibe (ZETIA) 10 MG tablet Take 1 tablet (10 mg total) by mouth daily. 90 tablet 3  . fenofibrate (TRICOR) 145 MG tablet Take 1 tablet (145 mg total) by mouth daily. (Patient taking differently: Take 145 mg by mouth every evening.) 90 tablet 3  . fluticasone (FLONASE) 50 MCG/ACT nasal spray Place 1 spray into both nostrils daily as needed (seasonal allergies).     . losartan (COZAAR) 100 MG tablet Take 1 tablet (100 mg total) by mouth daily. 90 tablet 3  . magnesium oxide (MAG-OX) 400 MG tablet Take 400 mg by mouth every morning.    .  metFORMIN (GLUCOPHAGE) 850 MG tablet Take 850 mg by mouth 2 (two) times daily with a meal.     . Misc Natural Products (TURMERIC CURCUMIN) CAPS Take by mouth 2 (two) times daily.    . Multiple Vitamin (MULTIVITAMIN WITH MINERALS) TABS tablet Take 2 tablets by mouth every morning.     . NON FORMULARY Take 2 capsules by mouth daily. Amazin Grape: MUscadine Grapeseed/Skin    . Omega 3-6-9 Fatty Acids (ADULT OMEGA PLUS DHA PO) Take 100 mg by mouth daily.    . Omega-3 Fatty Acids (OMEGA-3 FISH OIL PO) Take 2 capsules by mouth daily.    [name] OVER THE COUNTER MEDICATION daily. Blood boost    . pantoprazole (PROTONIX) 40 MG tablet Take 1 tablet by mouth every morning.   6  . zinc gluconate 50 MG tablet Take 50 mg by mouth daily.     No current facility-administered medications for this visit.   Social history is notable he is married for 47 years. He has 2 children and 3 grandchildren. 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 He is a BS degree. There is no history of tobacco use. There is no alcohol use.  Family History  Problem Relation Age of Onset  . Diabetes Mother   . Cancer - Prostate Father   . Rheum arthritis Sister   . Diabetes Brother   . Arthritis Brother   . Cancer - Other Brother        pancreatic  . Rheum arthritis Brother   . Diabetes type II Son    ROS General: Negative; No fevers, chills, or night sweats;  HEENT: Negative; No changes in vision or hearing, sinus congestion, difficulty swallowing Pulmonary: Negative; No cough, wheezing, shortness of breath, hemoptysis Cardiovascular: Occasional atypical chest pain; No presyncope, syncope, palpitations GI: Positive for hiatal hernia; No nausea, vomiting, diarrhea, or abdominal pain GU: Negative; No dysuria, hematuria, or difficulty voiding Musculoskeletal: Negative; no myalgias, joint pain, or weakness Hematologic/Oncology: Negative; no easy bruising,  bleeding Endocrine: Positive for diabetes; no heat/cold intolerance; Neuro: Negative; no changes in balance, headaches Skin: Negative; No rashes or skin lesions Psychiatric: Negative; No behavioral problems, depression Sleep: OSA but since starting CPAP therapy no snoring, daytime sleepiness, hypersomnolence, bruxism, restless legs, hypnogognic hallucinations, no cataplexy Other comprehensive 14 point system review is negative.   PE BP (!) 134/58   Orr (!) 58   Ht 6\' 2"  (1.88 m)   Wt 240 lb (108.9 kg)   BMI 30.81 kg/m    Repeat blood pressure by me was 140/60, he tells me blood pressure at home was 128/58  Wt Readings from Last 3 Encounters:  01/05/21 240 lb (108.9 kg)  07/05/20 242 lb 6.4 oz (110 kg)  06/09/20 242 lb (109.8 kg)  General: Alert, oriented, no distress.  Skin: normal turgor, no rashes, warm and dry HEENT: Normocephalic, atraumatic. Pupils equal round and reactive to light; sclera anicteric; extraocular muscles intact; Hearing aids Nose without nasal septal hypertrophy Mouth/Parynx benign; Mallinpatti scale 3 Neck: No JVD, no carotid bruits; normal carotid upstroke Lungs: clear to ausculatation and percussion; no wheezing or rales Chest wall: without tenderness to palpitation Heart: PMI not displaced, RRR, s1 s2 normal, 1/6 systolic murmur, no diastolic murmur, no rubs, gallops, thrills, or heaves Abdomen: soft, nontender; no hepatosplenomehaly, BS+; abdominal aorta nontender and not dilated by palpation. Back: no CVA tenderness Pulses 2+ Musculoskeletal: full range of motion, normal strength, no joint deformities Extremities: no clubbing cyanosis or edema, Homan's sign negative  Neurologic: grossly nonfocal; Cranial nerves grossly wnl Psychologic: Normal mood and affect   ECG (independently read by me):Sinus bradycardia at 58  October 2021 ECG (independently read by me): Sinus bradycardia at 59; normal intervals  March 18, 2021ECG (independently read  by me): Sinus bradycardia 59 bpm. PR interval 206 ms. No ectopy.    April 29, 2019  ECG (independently read by me): Normal sinus rhythm at 62 bpm.  Borderline first-degree AV block with a period of 206 ms.  No ectopy.  09/24/2018 ECG (independently read by me): Sinus rhythm at 66 bpm.  Q wave in lead III and aVF.  Peroneal 194 ms.  QTc interval 400 ms.  May 24, 2018 ECG (independently read by me): Sinus bradycardia 50 bpm.  Borderline first-degree AV block with a PR interval of 206 ms.  Poor anterior R wave progression.  No ectopy.  April 11, 2018 ECG (independently read by me): Sinus bradycardia at 47 bpm.  First-degree AV block.  PR interval 228 ms.  May 2019 ECG (independently read by me): Sinus bradycardia 52 bpm with mild sinus arrhythmia.  First-degree AV block with a PR interval at 218 ms.  QS complex V1 V2.  September 2018 ECG (independently read by me): Sinus rhythm at 60 bpm with first-degree AV block.  Small Q wave in lead 3.  Nonspecific T changes.  PR interval 220 ms.  May 2018 ECG (independently read by me): Sinus bradycardia 52 bpm.  Q wave in lead 3.  Normal intervals  January 2017 ECG (independently read by me):  Normal sinus rhythm at 62 with an isolated PVC.  QTC 410 ms.  First degree AV block with a PR interval 226.  Previous ECG from 09/15/13 (independently read by me): Sinus rhythm at 67 beats per minute with borderline first-degree block with PR interval 212 ms.   LABS:  BMP Latest Ref Rng & Units 10/27/2019 08/03/2018 07/12/2018  Glucose 65 - 99 mg/dL 135(H) 93 132(H)  BUN 8 - 27 mg/dL $Remove'18 17 21  'jFLIApm$ Creatinine 0.76 - 1.27 mg/dL 1.06 1.09 1.19  BUN/Creat Ratio 10 - 24 17 - 18  Sodium 134 - 144 mmol/L 138 137 137  Potassium 3.5 - 5.2 mmol/L 4.7 4.0 4.7  Chloride 96 - 106 mmol/L 103 104 99  CO2 20 - 29 mmol/L $RemoveB'20 26 24  'LafqcjGn$ Calcium 8.6 - 10.2 mg/dL 9.7 9.5 10.0   Hepatic Function Latest Ref Rng & Units 10/27/2019 04/16/2019 08/13/2018  Total Protein 6.0 - 8.5 g/dL 6.8  6.7 6.6  Albumin 3.7 - 4.7 g/dL 4.4 4.3 4.2  AST 0 - 40 IU/L $Remov'18 15 15  'HhcdkU$ ALT 0 - 44 IU/L $Remov'11 14 13  'JSdKsx$ Alk Phosphatase 39 - 117 IU/L 69 49 61  Total Bilirubin 0.0 -  1.2 mg/dL 0.3 0.3 <0.2  Bilirubin, Direct 0.00 - 0.40 mg/dL - 0.14 0.12   CBC Latest Ref Rng & Units 10/27/2019 08/03/2018 01/09/2017  WBC 3.4 - 10.8 x10E3/uL 6.9 4.9 5.7  Hemoglobin 13.0 - 17.7 g/dL 12.9(L) 12.8(L) 9.9(L)  Hematocrit 37.5 - 51.0 % 39.5 41.0 30.4(L)  Platelets 150 - 450 x10E3/uL 247 241 268   Lab Results  Component Value Date   MCV 93 10/27/2019   MCV 94.5 08/03/2018   MCV 91.8 01/09/2017   Lab Results  Component Value Date   TSH 2.580 10/27/2019   Lab Results  Component Value Date   HGBA1C 7.2 (H) 01/03/2017   Lipid Panel     Component Value Date/Time   CHOL 101 10/27/2019 1052   TRIG 181 (H) 10/27/2019 1052   HDL 25 (L) 10/27/2019 1052   CHOLHDL 4.0 10/27/2019 1052   CHOLHDL 4.5 01/01/2017 0042   VLDL UNABLE TO CALCULATE IF TRIGLYCERIDE OVER 400 mg/dL 01/01/2017 0042   LDLCALC 46 10/27/2019 1052     RADIOLOGY: No results found.  IMPRESSION:  1. Coronary artery disease involving native coronary artery of native heart without angina pectoris   2. S/P CABG x 3   3. Atypical chest pain   4. OSA on CPAP   5. Mixed hyperlipidemia   6. Mild obesity     ASSESSMENT AND PLAN: Mr. Nienhaus is a 75 year old African American male who has a  long-standing history of hypertension and diabetes mellitus. He has a family history of CAD with his mother dying from myocardial infarction and who also diabetes mellitus at age 50.  A remote nuclear study had revealed normal perfusion and  An echo Doppler study  revealed normal systolic function without regional wall motion abnormalities.  There was grade 2 diastolic dysfunction and to tissue Doppler was abnormal  suggesting elevation of ventricular filling pressures.  He developed new onset unstable angina leading to urgent catheterization on 01/02/2017 and  was  found to have a complex bifurcation stenosis with a [place] 011 lesion.  He ultimately underwent CABG surgery with LIMA to his LAD and vein grafts to 2 diagonal vessels.    He subsequently developed bradycardia  and I reduced his diltiazem down to 240 mg.  On a subsequent evaluation he inadvertently was taking 2 calcium channel blockers and I discontinued diltiazem.  Subsequently, his amlodipine dose was titrated to 7.5 mg and later to 10 mg for more optimal blood pressure benefit.  He had developed palpitations leading to an emergency room evaluation in December 2019 and on a ZIO patch monitor he was noted to have frequent PVCs with a very rare couplet and one triplet and had one episode of 4 beats of SVT but otherwise heart rate was averaging at 67. I suggested slight additional titration of his a.m. carvedilol.  Over the past year he had developed recurrent episodes of somewhat atypical chest pain.  His most recent nuclear perfusion study from June 09, 2020 remained low risk which showed a nuclear stress EF at 61% with normal perfusion.  In March 2022 he had presented to [facility] health care and rocking him ER and a chest CT did not reveal evidence for thoracic aortic aneurysm or dissection.  There was no evidence for pulmonary embolism.  His blood pressure today is stable on his current regimen of amlodipine 10 mg, carvedilol 37.5 mg in the morning and 25 mg at night, losartan 100 mg daily.  He is on Zetia 10 mg and fenofibrate  for hyperlipidemia and did not tolerate statins.  He is diabetic on metformin.  He admits to excellent compliance with CPAP use for his obstructive sleep apnea which is now followed at the [facility].  He is feeling well following his recent COVID positivity and had success with treatment with Paxlovid bid  for 5 days.   Troy Sine, MD, [facility] 01/11/2021 2:02 PM

## 2021-01-11 ENCOUNTER — Encounter: Payer: Self-pay | Admitting: Cardiovascular Disease

## 2021-01-11 DIAGNOSIS — I10 Essential (primary) hypertension: Secondary | ICD-10-CM | POA: Diagnosis not present

## 2021-01-18 DIAGNOSIS — E7800 Pure hypercholesterolemia, unspecified: Secondary | ICD-10-CM | POA: Diagnosis not present

## 2021-01-18 DIAGNOSIS — Z6832 Body mass index (BMI) 32.0-32.9, adult: Secondary | ICD-10-CM | POA: Diagnosis not present

## 2021-01-18 DIAGNOSIS — Z299 Encounter for prophylactic measures, unspecified: Secondary | ICD-10-CM | POA: Diagnosis not present

## 2021-01-18 DIAGNOSIS — Z1331 Encounter for screening for depression: Secondary | ICD-10-CM | POA: Diagnosis not present

## 2021-01-18 DIAGNOSIS — E1165 Type 2 diabetes mellitus with hyperglycemia: Secondary | ICD-10-CM | POA: Diagnosis not present

## 2021-01-18 DIAGNOSIS — Z1339 Encounter for screening examination for other mental health and behavioral disorders: Secondary | ICD-10-CM | POA: Diagnosis not present

## 2021-01-18 DIAGNOSIS — Z7189 Other specified counseling: Secondary | ICD-10-CM | POA: Diagnosis not present

## 2021-01-18 DIAGNOSIS — Z0000 Encounter for general adult medical examination without abnormal findings: Secondary | ICD-10-CM | POA: Diagnosis not present

## 2021-01-19 DIAGNOSIS — E1140 Type 2 diabetes mellitus with diabetic neuropathy, unspecified: Secondary | ICD-10-CM | POA: Diagnosis not present

## 2021-01-19 DIAGNOSIS — L110 Acquired keratosis follicularis: Secondary | ICD-10-CM | POA: Diagnosis not present

## 2021-01-19 DIAGNOSIS — Z79899 Other long term (current) drug therapy: Secondary | ICD-10-CM | POA: Diagnosis not present

## 2021-01-19 DIAGNOSIS — Z125 Encounter for screening for malignant neoplasm of prostate: Secondary | ICD-10-CM | POA: Diagnosis not present

## 2021-01-19 DIAGNOSIS — L609 Nail disorder, unspecified: Secondary | ICD-10-CM | POA: Diagnosis not present

## 2021-01-19 DIAGNOSIS — E7800 Pure hypercholesterolemia, unspecified: Secondary | ICD-10-CM | POA: Diagnosis not present

## 2021-01-19 DIAGNOSIS — R5383 Other fatigue: Secondary | ICD-10-CM | POA: Diagnosis not present

## 2021-01-21 DIAGNOSIS — M9902 Segmental and somatic dysfunction of thoracic region: Secondary | ICD-10-CM | POA: Diagnosis not present

## 2021-01-21 DIAGNOSIS — M9901 Segmental and somatic dysfunction of cervical region: Secondary | ICD-10-CM | POA: Diagnosis not present

## 2021-01-21 DIAGNOSIS — S233XXA Sprain of ligaments of thoracic spine, initial encounter: Secondary | ICD-10-CM | POA: Diagnosis not present

## 2021-01-21 DIAGNOSIS — S134XXA Sprain of ligaments of cervical spine, initial encounter: Secondary | ICD-10-CM | POA: Diagnosis not present

## 2021-01-21 DIAGNOSIS — M9903 Segmental and somatic dysfunction of lumbar region: Secondary | ICD-10-CM | POA: Diagnosis not present

## 2021-01-21 DIAGNOSIS — M47816 Spondylosis without myelopathy or radiculopathy, lumbar region: Secondary | ICD-10-CM | POA: Diagnosis not present

## 2021-01-25 ENCOUNTER — Encounter: Payer: Self-pay | Admitting: Cardiovascular Disease

## 2021-02-01 DIAGNOSIS — M170 Bilateral primary osteoarthritis of knee: Secondary | ICD-10-CM | POA: Diagnosis not present

## 2021-02-01 DIAGNOSIS — D239 Other benign neoplasm of skin, unspecified: Secondary | ICD-10-CM | POA: Diagnosis not present

## 2021-02-01 DIAGNOSIS — B354 Tinea corporis: Secondary | ICD-10-CM | POA: Diagnosis not present

## 2021-02-10 DIAGNOSIS — K219 Gastro-esophageal reflux disease without esophagitis: Secondary | ICD-10-CM | POA: Diagnosis not present

## 2021-02-10 DIAGNOSIS — I10 Essential (primary) hypertension: Secondary | ICD-10-CM | POA: Diagnosis not present

## 2021-02-10 DIAGNOSIS — E7800 Pure hypercholesterolemia, unspecified: Secondary | ICD-10-CM | POA: Diagnosis not present

## 2021-02-10 DIAGNOSIS — E1165 Type 2 diabetes mellitus with hyperglycemia: Secondary | ICD-10-CM | POA: Diagnosis not present

## 2021-02-22 DIAGNOSIS — M9901 Segmental and somatic dysfunction of cervical region: Secondary | ICD-10-CM | POA: Diagnosis not present

## 2021-02-22 DIAGNOSIS — M9902 Segmental and somatic dysfunction of thoracic region: Secondary | ICD-10-CM | POA: Diagnosis not present

## 2021-02-22 DIAGNOSIS — M9903 Segmental and somatic dysfunction of lumbar region: Secondary | ICD-10-CM | POA: Diagnosis not present

## 2021-02-22 DIAGNOSIS — M47816 Spondylosis without myelopathy or radiculopathy, lumbar region: Secondary | ICD-10-CM | POA: Diagnosis not present

## 2021-02-22 DIAGNOSIS — S134XXA Sprain of ligaments of cervical spine, initial encounter: Secondary | ICD-10-CM | POA: Diagnosis not present

## 2021-02-22 DIAGNOSIS — S233XXA Sprain of ligaments of thoracic spine, initial encounter: Secondary | ICD-10-CM | POA: Diagnosis not present

## 2021-03-11 DIAGNOSIS — I10 Essential (primary) hypertension: Secondary | ICD-10-CM | POA: Diagnosis not present

## 2021-03-17 DIAGNOSIS — U071 COVID-19: Secondary | ICD-10-CM | POA: Diagnosis not present

## 2021-03-18 DIAGNOSIS — Z713 Dietary counseling and surveillance: Secondary | ICD-10-CM | POA: Diagnosis not present

## 2021-03-18 DIAGNOSIS — I10 Essential (primary) hypertension: Secondary | ICD-10-CM | POA: Diagnosis not present

## 2021-03-18 DIAGNOSIS — Z299 Encounter for prophylactic measures, unspecified: Secondary | ICD-10-CM | POA: Diagnosis not present

## 2021-03-18 DIAGNOSIS — E1165 Type 2 diabetes mellitus with hyperglycemia: Secondary | ICD-10-CM | POA: Diagnosis not present

## 2021-03-18 DIAGNOSIS — Z6832 Body mass index (BMI) 32.0-32.9, adult: Secondary | ICD-10-CM | POA: Diagnosis not present

## 2021-03-22 DIAGNOSIS — Z8371 Family history of colonic polyps: Secondary | ICD-10-CM | POA: Diagnosis not present

## 2021-03-22 DIAGNOSIS — Z86010 Personal history of colonic polyps: Secondary | ICD-10-CM | POA: Diagnosis not present

## 2021-03-22 DIAGNOSIS — Z1211 Encounter for screening for malignant neoplasm of colon: Secondary | ICD-10-CM | POA: Diagnosis not present

## 2021-03-22 DIAGNOSIS — K219 Gastro-esophageal reflux disease without esophagitis: Secondary | ICD-10-CM | POA: Diagnosis not present

## 2021-03-25 DIAGNOSIS — M9903 Segmental and somatic dysfunction of lumbar region: Secondary | ICD-10-CM | POA: Diagnosis not present

## 2021-03-25 DIAGNOSIS — S134XXA Sprain of ligaments of cervical spine, initial encounter: Secondary | ICD-10-CM | POA: Diagnosis not present

## 2021-03-25 DIAGNOSIS — M9901 Segmental and somatic dysfunction of cervical region: Secondary | ICD-10-CM | POA: Diagnosis not present

## 2021-03-25 DIAGNOSIS — S233XXA Sprain of ligaments of thoracic spine, initial encounter: Secondary | ICD-10-CM | POA: Diagnosis not present

## 2021-03-25 DIAGNOSIS — M47816 Spondylosis without myelopathy or radiculopathy, lumbar region: Secondary | ICD-10-CM | POA: Diagnosis not present

## 2021-03-25 DIAGNOSIS — M9902 Segmental and somatic dysfunction of thoracic region: Secondary | ICD-10-CM | POA: Diagnosis not present

## 2021-03-30 DIAGNOSIS — L609 Nail disorder, unspecified: Secondary | ICD-10-CM | POA: Diagnosis not present

## 2021-03-30 DIAGNOSIS — L110 Acquired keratosis follicularis: Secondary | ICD-10-CM | POA: Diagnosis not present

## 2021-03-30 DIAGNOSIS — E1140 Type 2 diabetes mellitus with diabetic neuropathy, unspecified: Secondary | ICD-10-CM | POA: Diagnosis not present

## 2021-04-13 DIAGNOSIS — E1165 Type 2 diabetes mellitus with hyperglycemia: Secondary | ICD-10-CM | POA: Diagnosis not present

## 2021-04-13 DIAGNOSIS — I10 Essential (primary) hypertension: Secondary | ICD-10-CM | POA: Diagnosis not present

## 2021-04-13 DIAGNOSIS — E7800 Pure hypercholesterolemia, unspecified: Secondary | ICD-10-CM | POA: Diagnosis not present

## 2021-04-13 DIAGNOSIS — K219 Gastro-esophageal reflux disease without esophagitis: Secondary | ICD-10-CM | POA: Diagnosis not present

## 2021-04-22 DIAGNOSIS — E1165 Type 2 diabetes mellitus with hyperglycemia: Secondary | ICD-10-CM | POA: Diagnosis not present

## 2021-04-22 DIAGNOSIS — I10 Essential (primary) hypertension: Secondary | ICD-10-CM | POA: Diagnosis not present

## 2021-04-22 DIAGNOSIS — J309 Allergic rhinitis, unspecified: Secondary | ICD-10-CM | POA: Diagnosis not present

## 2021-04-22 DIAGNOSIS — Z299 Encounter for prophylactic measures, unspecified: Secondary | ICD-10-CM | POA: Diagnosis not present

## 2021-04-22 DIAGNOSIS — Z6831 Body mass index (BMI) 31.0-31.9, adult: Secondary | ICD-10-CM | POA: Diagnosis not present

## 2021-04-25 DIAGNOSIS — S134XXA Sprain of ligaments of cervical spine, initial encounter: Secondary | ICD-10-CM | POA: Diagnosis not present

## 2021-04-25 DIAGNOSIS — M9902 Segmental and somatic dysfunction of thoracic region: Secondary | ICD-10-CM | POA: Diagnosis not present

## 2021-04-25 DIAGNOSIS — M9901 Segmental and somatic dysfunction of cervical region: Secondary | ICD-10-CM | POA: Diagnosis not present

## 2021-04-25 DIAGNOSIS — S233XXA Sprain of ligaments of thoracic spine, initial encounter: Secondary | ICD-10-CM | POA: Diagnosis not present

## 2021-04-25 DIAGNOSIS — M9903 Segmental and somatic dysfunction of lumbar region: Secondary | ICD-10-CM | POA: Diagnosis not present

## 2021-04-25 DIAGNOSIS — M47816 Spondylosis without myelopathy or radiculopathy, lumbar region: Secondary | ICD-10-CM | POA: Diagnosis not present

## 2021-05-03 DIAGNOSIS — Z299 Encounter for prophylactic measures, unspecified: Secondary | ICD-10-CM | POA: Diagnosis not present

## 2021-05-03 DIAGNOSIS — Z6831 Body mass index (BMI) 31.0-31.9, adult: Secondary | ICD-10-CM | POA: Diagnosis not present

## 2021-05-03 DIAGNOSIS — K219 Gastro-esophageal reflux disease without esophagitis: Secondary | ICD-10-CM | POA: Diagnosis not present

## 2021-05-03 DIAGNOSIS — I10 Essential (primary) hypertension: Secondary | ICD-10-CM | POA: Diagnosis not present

## 2021-05-03 DIAGNOSIS — Z713 Dietary counseling and surveillance: Secondary | ICD-10-CM | POA: Diagnosis not present

## 2021-05-03 DIAGNOSIS — Z23 Encounter for immunization: Secondary | ICD-10-CM | POA: Diagnosis not present

## 2021-05-13 DIAGNOSIS — I10 Essential (primary) hypertension: Secondary | ICD-10-CM | POA: Diagnosis not present

## 2021-05-13 DIAGNOSIS — E1165 Type 2 diabetes mellitus with hyperglycemia: Secondary | ICD-10-CM | POA: Diagnosis not present

## 2021-05-17 ENCOUNTER — Telehealth: Payer: Self-pay | Admitting: Cardiovascular Disease

## 2021-05-17 DIAGNOSIS — M47816 Spondylosis without myelopathy or radiculopathy, lumbar region: Secondary | ICD-10-CM | POA: Diagnosis not present

## 2021-05-17 DIAGNOSIS — M9902 Segmental and somatic dysfunction of thoracic region: Secondary | ICD-10-CM | POA: Diagnosis not present

## 2021-05-17 DIAGNOSIS — M9901 Segmental and somatic dysfunction of cervical region: Secondary | ICD-10-CM | POA: Diagnosis not present

## 2021-05-17 DIAGNOSIS — M9903 Segmental and somatic dysfunction of lumbar region: Secondary | ICD-10-CM | POA: Diagnosis not present

## 2021-05-17 DIAGNOSIS — S233XXA Sprain of ligaments of thoracic spine, initial encounter: Secondary | ICD-10-CM | POA: Diagnosis not present

## 2021-05-17 DIAGNOSIS — S134XXA Sprain of ligaments of cervical spine, initial encounter: Secondary | ICD-10-CM | POA: Diagnosis not present

## 2021-05-17 NOTE — Telephone Encounter (Signed)
[name] is his primary cardiologist, not me. He should discuss with [name]

## 2021-05-17 NOTE — Telephone Encounter (Signed)
Pt states that he was seen in the hospital by Dr. Tamala Julian and has a few questions that he'd like to ask... please advise

## 2021-05-18 DIAGNOSIS — R1084 Generalized abdominal pain: Secondary | ICD-10-CM | POA: Diagnosis not present

## 2021-05-18 NOTE — Telephone Encounter (Signed)
Ed is returning William Orr's call.

## 2021-05-18 NOTE — Telephone Encounter (Signed)
Left a message for the patient to call back.  

## 2021-05-18 NOTE — Telephone Encounter (Signed)
Spoke to patient he stated he wanted to switch care to [facility].Advised I will send message to Dr.Smith and Dr.Kelly.

## 2021-05-23 NOTE — Telephone Encounter (Signed)
Okay by me if patient wishes to change to Dr. Tamala Julian

## 2021-05-30 NOTE — Telephone Encounter (Signed)
Will send to Dr. Tamala Julian for approval of taking on this pt

## 2021-05-30 NOTE — Telephone Encounter (Signed)
Message sent to Dr.Smith's RN to schedule patient appointment.

## 2021-05-31 NOTE — Telephone Encounter (Signed)
William Crome, MD  Sent: Mon May 30, 2021  5:39 PM  To: Loren Racer, RN          Message  Okay       Will route to scheduling team to get pt scheduled with Dr. Tamala Julian next available.

## 2021-06-01 DIAGNOSIS — M9902 Segmental and somatic dysfunction of thoracic region: Secondary | ICD-10-CM | POA: Diagnosis not present

## 2021-06-01 DIAGNOSIS — S134XXA Sprain of ligaments of cervical spine, initial encounter: Secondary | ICD-10-CM | POA: Diagnosis not present

## 2021-06-01 DIAGNOSIS — M47816 Spondylosis without myelopathy or radiculopathy, lumbar region: Secondary | ICD-10-CM | POA: Diagnosis not present

## 2021-06-01 DIAGNOSIS — S233XXA Sprain of ligaments of thoracic spine, initial encounter: Secondary | ICD-10-CM | POA: Diagnosis not present

## 2021-06-01 DIAGNOSIS — M9903 Segmental and somatic dysfunction of lumbar region: Secondary | ICD-10-CM | POA: Diagnosis not present

## 2021-06-01 DIAGNOSIS — M9901 Segmental and somatic dysfunction of cervical region: Secondary | ICD-10-CM | POA: Diagnosis not present

## 2021-06-06 DIAGNOSIS — Z23 Encounter for immunization: Secondary | ICD-10-CM | POA: Diagnosis not present

## 2021-06-13 DIAGNOSIS — I10 Essential (primary) hypertension: Secondary | ICD-10-CM | POA: Diagnosis not present

## 2021-06-15 DIAGNOSIS — M9903 Segmental and somatic dysfunction of lumbar region: Secondary | ICD-10-CM | POA: Diagnosis not present

## 2021-06-15 DIAGNOSIS — S134XXA Sprain of ligaments of cervical spine, initial encounter: Secondary | ICD-10-CM | POA: Diagnosis not present

## 2021-06-15 DIAGNOSIS — Z6831 Body mass index (BMI) 31.0-31.9, adult: Secondary | ICD-10-CM | POA: Diagnosis not present

## 2021-06-15 DIAGNOSIS — M549 Dorsalgia, unspecified: Secondary | ICD-10-CM | POA: Diagnosis not present

## 2021-06-15 DIAGNOSIS — M9901 Segmental and somatic dysfunction of cervical region: Secondary | ICD-10-CM | POA: Diagnosis not present

## 2021-06-15 DIAGNOSIS — E559 Vitamin D deficiency, unspecified: Secondary | ICD-10-CM | POA: Diagnosis not present

## 2021-06-15 DIAGNOSIS — M47816 Spondylosis without myelopathy or radiculopathy, lumbar region: Secondary | ICD-10-CM | POA: Diagnosis not present

## 2021-06-15 DIAGNOSIS — I10 Essential (primary) hypertension: Secondary | ICD-10-CM | POA: Diagnosis not present

## 2021-06-15 DIAGNOSIS — M9902 Segmental and somatic dysfunction of thoracic region: Secondary | ICD-10-CM | POA: Diagnosis not present

## 2021-06-15 DIAGNOSIS — Z299 Encounter for prophylactic measures, unspecified: Secondary | ICD-10-CM | POA: Diagnosis not present

## 2021-06-15 DIAGNOSIS — S233XXA Sprain of ligaments of thoracic spine, initial encounter: Secondary | ICD-10-CM | POA: Diagnosis not present

## 2021-06-15 DIAGNOSIS — E1165 Type 2 diabetes mellitus with hyperglycemia: Secondary | ICD-10-CM | POA: Diagnosis not present

## 2021-06-23 DIAGNOSIS — Z7984 Long term (current) use of oral hypoglycemic drugs: Secondary | ICD-10-CM | POA: Diagnosis not present

## 2021-06-23 DIAGNOSIS — D125 Benign neoplasm of sigmoid colon: Secondary | ICD-10-CM | POA: Diagnosis not present

## 2021-06-23 DIAGNOSIS — D126 Benign neoplasm of colon, unspecified: Secondary | ICD-10-CM | POA: Diagnosis not present

## 2021-06-23 DIAGNOSIS — I2510 Atherosclerotic heart disease of native coronary artery without angina pectoris: Secondary | ICD-10-CM | POA: Diagnosis not present

## 2021-06-23 DIAGNOSIS — E119 Type 2 diabetes mellitus without complications: Secondary | ICD-10-CM | POA: Diagnosis not present

## 2021-06-23 DIAGNOSIS — E785 Hyperlipidemia, unspecified: Secondary | ICD-10-CM | POA: Diagnosis not present

## 2021-06-23 DIAGNOSIS — K635 Polyp of colon: Secondary | ICD-10-CM | POA: Diagnosis not present

## 2021-06-23 DIAGNOSIS — Z79899 Other long term (current) drug therapy: Secondary | ICD-10-CM | POA: Diagnosis not present

## 2021-06-23 DIAGNOSIS — I10 Essential (primary) hypertension: Secondary | ICD-10-CM | POA: Diagnosis not present

## 2021-06-23 DIAGNOSIS — R1084 Generalized abdominal pain: Secondary | ICD-10-CM | POA: Diagnosis not present

## 2021-06-23 DIAGNOSIS — K5730 Diverticulosis of large intestine without perforation or abscess without bleeding: Secondary | ICD-10-CM | POA: Diagnosis not present

## 2021-07-06 DIAGNOSIS — L609 Nail disorder, unspecified: Secondary | ICD-10-CM | POA: Diagnosis not present

## 2021-07-06 DIAGNOSIS — E1140 Type 2 diabetes mellitus with diabetic neuropathy, unspecified: Secondary | ICD-10-CM | POA: Diagnosis not present

## 2021-07-06 DIAGNOSIS — L110 Acquired keratosis follicularis: Secondary | ICD-10-CM | POA: Diagnosis not present

## 2021-07-13 DIAGNOSIS — E7800 Pure hypercholesterolemia, unspecified: Secondary | ICD-10-CM | POA: Diagnosis not present

## 2021-07-13 DIAGNOSIS — D126 Benign neoplasm of colon, unspecified: Secondary | ICD-10-CM | POA: Diagnosis not present

## 2021-07-13 DIAGNOSIS — I10 Essential (primary) hypertension: Secondary | ICD-10-CM | POA: Diagnosis not present

## 2021-07-13 DIAGNOSIS — M79671 Pain in right foot: Secondary | ICD-10-CM | POA: Diagnosis not present

## 2021-07-13 DIAGNOSIS — L03031 Cellulitis of right toe: Secondary | ICD-10-CM | POA: Diagnosis not present

## 2021-07-13 DIAGNOSIS — L600 Ingrowing nail: Secondary | ICD-10-CM | POA: Diagnosis not present

## 2021-07-13 DIAGNOSIS — K5790 Diverticulosis of intestine, part unspecified, without perforation or abscess without bleeding: Secondary | ICD-10-CM | POA: Diagnosis not present

## 2021-07-13 DIAGNOSIS — M79674 Pain in right toe(s): Secondary | ICD-10-CM | POA: Diagnosis not present

## 2021-07-21 DIAGNOSIS — M9901 Segmental and somatic dysfunction of cervical region: Secondary | ICD-10-CM | POA: Diagnosis not present

## 2021-07-21 DIAGNOSIS — M9903 Segmental and somatic dysfunction of lumbar region: Secondary | ICD-10-CM | POA: Diagnosis not present

## 2021-07-21 DIAGNOSIS — M9902 Segmental and somatic dysfunction of thoracic region: Secondary | ICD-10-CM | POA: Diagnosis not present

## 2021-07-21 DIAGNOSIS — S134XXA Sprain of ligaments of cervical spine, initial encounter: Secondary | ICD-10-CM | POA: Diagnosis not present

## 2021-07-21 DIAGNOSIS — S233XXA Sprain of ligaments of thoracic spine, initial encounter: Secondary | ICD-10-CM | POA: Diagnosis not present

## 2021-07-21 DIAGNOSIS — M47816 Spondylosis without myelopathy or radiculopathy, lumbar region: Secondary | ICD-10-CM | POA: Diagnosis not present

## 2021-08-03 DIAGNOSIS — L600 Ingrowing nail: Secondary | ICD-10-CM | POA: Diagnosis not present

## 2021-08-03 DIAGNOSIS — M79674 Pain in right toe(s): Secondary | ICD-10-CM | POA: Diagnosis not present

## 2021-08-03 DIAGNOSIS — M79672 Pain in left foot: Secondary | ICD-10-CM | POA: Diagnosis not present

## 2021-08-03 DIAGNOSIS — M79675 Pain in left toe(s): Secondary | ICD-10-CM | POA: Diagnosis not present

## 2021-08-03 DIAGNOSIS — M79671 Pain in right foot: Secondary | ICD-10-CM | POA: Diagnosis not present

## 2021-08-09 DIAGNOSIS — B349 Viral infection, unspecified: Secondary | ICD-10-CM | POA: Diagnosis not present

## 2021-08-10 ENCOUNTER — Other Ambulatory Visit: Payer: Self-pay | Admitting: *Deleted

## 2021-08-10 ENCOUNTER — Ambulatory Visit (INDEPENDENT_AMBULATORY_CARE_PROVIDER_SITE_OTHER): Payer: Medicare Other

## 2021-08-10 DIAGNOSIS — I493 Ventricular premature depolarization: Secondary | ICD-10-CM

## 2021-08-10 DIAGNOSIS — R002 Palpitations: Secondary | ICD-10-CM

## 2021-08-10 NOTE — Progress Notes (Unsigned)
Enrolled for [employer] to mail a ZIO XT long term holter monitor to the patients address on file.  

## 2021-08-12 DIAGNOSIS — I10 Essential (primary) hypertension: Secondary | ICD-10-CM | POA: Diagnosis not present

## 2021-08-17 DIAGNOSIS — R002 Palpitations: Secondary | ICD-10-CM

## 2021-08-17 DIAGNOSIS — I493 Ventricular premature depolarization: Secondary | ICD-10-CM | POA: Diagnosis not present

## 2021-08-18 DIAGNOSIS — M47816 Spondylosis without myelopathy or radiculopathy, lumbar region: Secondary | ICD-10-CM | POA: Diagnosis not present

## 2021-08-18 DIAGNOSIS — M9902 Segmental and somatic dysfunction of thoracic region: Secondary | ICD-10-CM | POA: Diagnosis not present

## 2021-08-18 DIAGNOSIS — M9903 Segmental and somatic dysfunction of lumbar region: Secondary | ICD-10-CM | POA: Diagnosis not present

## 2021-08-18 DIAGNOSIS — S233XXA Sprain of ligaments of thoracic spine, initial encounter: Secondary | ICD-10-CM | POA: Diagnosis not present

## 2021-08-18 DIAGNOSIS — M9901 Segmental and somatic dysfunction of cervical region: Secondary | ICD-10-CM | POA: Diagnosis not present

## 2021-08-18 DIAGNOSIS — S134XXA Sprain of ligaments of cervical spine, initial encounter: Secondary | ICD-10-CM | POA: Diagnosis not present

## 2021-09-11 DIAGNOSIS — I10 Essential (primary) hypertension: Secondary | ICD-10-CM | POA: Diagnosis not present

## 2021-09-13 DIAGNOSIS — E7800 Pure hypercholesterolemia, unspecified: Secondary | ICD-10-CM | POA: Diagnosis not present

## 2021-09-13 DIAGNOSIS — I10 Essential (primary) hypertension: Secondary | ICD-10-CM | POA: Diagnosis not present

## 2021-09-14 DIAGNOSIS — E1140 Type 2 diabetes mellitus with diabetic neuropathy, unspecified: Secondary | ICD-10-CM | POA: Diagnosis not present

## 2021-09-14 DIAGNOSIS — L110 Acquired keratosis follicularis: Secondary | ICD-10-CM | POA: Diagnosis not present

## 2021-09-14 DIAGNOSIS — L609 Nail disorder, unspecified: Secondary | ICD-10-CM | POA: Diagnosis not present

## 2021-09-20 DIAGNOSIS — R42 Dizziness and giddiness: Secondary | ICD-10-CM | POA: Diagnosis not present

## 2021-09-20 DIAGNOSIS — E1165 Type 2 diabetes mellitus with hyperglycemia: Secondary | ICD-10-CM | POA: Diagnosis not present

## 2021-09-20 DIAGNOSIS — Z299 Encounter for prophylactic measures, unspecified: Secondary | ICD-10-CM | POA: Diagnosis not present

## 2021-09-20 DIAGNOSIS — M9902 Segmental and somatic dysfunction of thoracic region: Secondary | ICD-10-CM | POA: Diagnosis not present

## 2021-09-20 DIAGNOSIS — Z789 Other specified health status: Secondary | ICD-10-CM | POA: Diagnosis not present

## 2021-09-20 DIAGNOSIS — M47816 Spondylosis without myelopathy or radiculopathy, lumbar region: Secondary | ICD-10-CM | POA: Diagnosis not present

## 2021-09-20 DIAGNOSIS — M9903 Segmental and somatic dysfunction of lumbar region: Secondary | ICD-10-CM | POA: Diagnosis not present

## 2021-09-20 DIAGNOSIS — I10 Essential (primary) hypertension: Secondary | ICD-10-CM | POA: Diagnosis not present

## 2021-09-20 DIAGNOSIS — Z6832 Body mass index (BMI) 32.0-32.9, adult: Secondary | ICD-10-CM | POA: Diagnosis not present

## 2021-09-20 DIAGNOSIS — S134XXA Sprain of ligaments of cervical spine, initial encounter: Secondary | ICD-10-CM | POA: Diagnosis not present

## 2021-09-20 DIAGNOSIS — S233XXA Sprain of ligaments of thoracic spine, initial encounter: Secondary | ICD-10-CM | POA: Diagnosis not present

## 2021-09-20 DIAGNOSIS — M9901 Segmental and somatic dysfunction of cervical region: Secondary | ICD-10-CM | POA: Diagnosis not present

## 2021-09-20 DIAGNOSIS — I700 Atherosclerosis of aorta: Secondary | ICD-10-CM | POA: Diagnosis not present

## 2021-10-11 DIAGNOSIS — I10 Essential (primary) hypertension: Secondary | ICD-10-CM | POA: Diagnosis not present

## 2021-10-11 DIAGNOSIS — E7800 Pure hypercholesterolemia, unspecified: Secondary | ICD-10-CM | POA: Diagnosis not present

## 2021-10-17 ENCOUNTER — Telehealth: Payer: Self-pay | Admitting: Interventional Cardiology

## 2021-10-17 ENCOUNTER — Encounter: Payer: Self-pay | Admitting: Interventional Cardiology

## 2021-10-17 NOTE — Telephone Encounter (Signed)
Pt c/o of Chest Pain: STAT if CP now or developed within 24 hours ? ?1. Are you having CP right now? no ? ?2. Are you experiencing any other symptoms (ex. SOB, nausea, vomiting, sweating)? no ? ?3. How long have you been experiencing CP? A couple days ? ?4. Is your CP continuous or coming and going? Comes and goes ? ?5. Have you taken Nitroglycerin? no ??  ? ?Patient states he has been having chest discomfort for the past couple of days, but says he gets PVC's and they feel the same way. He says the discomfort is on the right side, but he is not having it now. He says he also used his kardiamobile and took an Copy. He would like to know if he can send it to the office, because it said he had possible afib. He also states he wore a holter monitor, but never got to discuss the results. ? ?

## 2021-10-17 NOTE — Telephone Encounter (Signed)
Spoke with the patient who reports that he has been having discomfort on the right side of his chest for several days now. He states that its not sharp or tight  but more of an ache. He reports that his Jodelle Red mobile also alerted him recently of possible a fib. He denies any shortness of breath, headache, nausea, or dizziness. He states that his heart rate is usually around 60 but will jump up at times. Earlier this morning went up to 100 but then came back down. Blood pressure usually runs around 140s/60s.  ?He has not had this feeling of chest discomfort before. It is not associated with activity. ?He is going to send a MyChart message with his Jodelle Red mobile reading.  ?Patient is scheduled for an appointment with Richardson Dopp, PA-C on 3/10.  ?

## 2021-10-20 ENCOUNTER — Encounter: Payer: Self-pay | Admitting: Physician Assistant

## 2021-10-20 DIAGNOSIS — I493 Ventricular premature depolarization: Secondary | ICD-10-CM

## 2021-10-20 HISTORY — DX: Ventricular premature depolarization: I49.3

## 2021-10-20 NOTE — Progress Notes (Signed)
Cardiology Office Note:    Date:  10/21/2021   ID:  William Orr, DOB July 18, 1946, MRN [id number]  PCP:  [name], MD  [facility] Providers Cardiologist:  Shelva Majestic, MD    Referring MD: [name], MD   Chief Complaint:  Chest Pain    Patient Profile: Coronary artery disease NSTEMI 5/18 s/p CABG (LIMA-LAD, SVG-D1, SVG-D2) Myoview 10/21: No ischemia PVCs Monitor 08/2018: 14.1% Monitor 1/23: 8.1% Echo 5/18: EF 55-60 Diabetes mellitus Hypertension Hyperlipidemia Sleep apnea  Prior CV Studies: Cardiac monitor 09/05/2021 NSR, sinus bradycardia Ventricular bigeminy and trigeminy PVCs with burden 8.1% NSVT-longest 7 beats   Myoview 06/09/2020 EF 60, Normal perfusion, low risk  Cardiac monitor 08/19/2018 NSR Frequent PVCs (14.1%)  Pre-CABG Dopplers 01/03/2017 R ICA 1-39; L ICA 40-59  Echocardiogram 01/01/2017 Mild focal basal septal hypertrophy, EF 55-60, no RWMA, GRII DD, moderate LAE    History of Present Illness:   William Orr is a 76 y.o. male with the above problem list.  He is a prior patient of [name].  He will establish with Dr. Tamala Julian in May.  He called in recently with symptoms of chest pain as well as alerts on his Kardia mobile device suggesting atrial fibrillation.  Those strips were sent through [place].  They were reviewed by Dr. Tamala Julian.  There is no evidence of atrial fibrillation.  He did have evidence of PVCs.  He returns for further evaluation.  Last week, he had an ache in his right chest from time to time.  He did not take nitroglycerin.  He has had chronic chest discomfort since his bypass surgery.  He has not had symptoms reminiscent of his previous angina.  He has not had significant shortness of breath.  He has not had syncope.  He has not had orthopnea, leg edema.  He has not had pleuritic chest symptoms or symptoms with lying supine.  He does note a lot of neck problems as well as lower back problems on the right.        Past  Medical History:  Diagnosis Date   Diabetes mellitus without complication ([place])    History of hiatal hernia    Hyperlipidemia    Hypertension    PVC's (premature ventricular contractions) 10/20/2021   Monitor 08/2018: 14.1% Monitor 1/23: 8.1% Echo 5/18: EF 55-60   S/P CABG x 3 01/04/2017   LIMA to LAD, Sequential SVG to D1-D2, EVH via right thigh   Current Medications: Current Meds  Medication Sig   acetaminophen (TYLENOL) 650 MG CR tablet Take 650 mg by mouth every 8 (eight) hours as needed for pain.   Alogliptin Benzoate 25 MG TABS Take 1 tablet by mouth every morning.   amLODipine (NORVASC) 10 MG tablet Take 10 mg by mouth daily.   Apple Cider Vinegar 500 MG TABS Take 3 tablets by mouth daily.   aspirin EC 81 MG EC tablet Take 1 tablet (81 mg total) by mouth daily.   carvedilol (COREG) 25 MG tablet Take 1.5 tablet (37.5) in the AM, and 1 tablet (25 mg) in the PM (Patient taking differently: Take 1.5 tablet (37.5) in the AM, and pm)   cetirizine (ZYRTEC) 10 MG tablet Take 10 mg by mouth daily as needed for allergies.    Cholecalciferol (VITAMIN D-3) 125 MCG (5000 UT) TABS Take 5,000 Units by mouth daily. Pt takes 2 capsule once a day.   Cholecalciferol (VITAMIN D-3) 5000 UNITS TABS Take 10,000 Units by mouth every morning.  COPPER PO Take 1 capsule by mouth daily.   ezetimibe (ZETIA) 10 MG tablet Take 1 tablet (10 mg total) by mouth daily.   fenofibrate (TRICOR) 145 MG tablet Take 1 tablet (145 mg total) by mouth daily. (Patient taking differently: Take 145 mg by mouth every evening.)   fluticasone (FLONASE) 50 MCG/ACT nasal spray Place 1 spray into both nostrils daily as needed (seasonal allergies).    magnesium oxide (MAG-OX) 400 MG tablet Take 400 mg by mouth every morning.   metFORMIN (GLUCOPHAGE) 850 MG tablet Take 850 mg by mouth 2 (two) times daily with a meal.    Misc Natural Products (TURMERIC CURCUMIN) CAPS Take by mouth 2 (two) times daily.   Multiple Vitamin (MULTIVITAMIN  WITH MINERALS) TABS tablet Take 2 tablets by mouth every morning.    Multiple Vitamin (MULTIVITAMIN) tablet Take 1 tablet by mouth daily.   NON FORMULARY Take 2 capsules by mouth daily. Amazin Grape: MUscadine Grapeseed/Skin   Omega 3-6-9 Fatty Acids (ADULT OMEGA PLUS DHA PO) Take 100 mg by mouth daily.   Omega 3-6-9 Fatty Acids (OMEGA-3-6-9 PO) Take 200 mg by mouth daily. Pt. Take 3 tables 200 mg of Omega Plus once a day.   Omega-3 Fatty Acids (OMEGA-3 FISH OIL PO) Take 2 capsules by mouth daily.   OVER THE COUNTER MEDICATION daily. Blood boost   OVER THE COUNTER MEDICATION Pt take the supplement life tones for uric acid support. Take 1/2 tsb with water once a day.   OVER THE COUNTER MEDICATION 300 mg daily. Pt. Takes 2 CBD gummies once a day.   OVER THE COUNTER MEDICATION Take by mouth. Pt take Provent Ultra Blend to treat prostate once a day.   OVER THE COUNTER MEDICATION Take by mouth. Pt takes Gluco 24 supplement 1 TAB daily.   OVER THE COUNTER MEDICATION 1 mL. Pt takes the supplement call Wisdom once a day.   OVER THE COUNTER MEDICATION Take 1 capsule by mouth daily. Pt takes the supplement Red Boost daily.   OVER THE COUNTER MEDICATION Take 1 capsule by mouth every other day. Pt takes the supplement Osa Craver for glucose support.   OVER THE COUNTER MEDICATION Take 2 capsules by mouth daily. The   OVER THE COUNTER MEDICATION Take 2 capsules by mouth daily. The supplement Nitric Oxide for bloodpressure once a day   pantoprazole (PROTONIX) 40 MG tablet Take 1 tablet by mouth every morning.    tamsulosin (FLOMAX) 0.4 MG CAPS capsule Take 1 capsule by mouth daily.   traZODone (DESYREL) 50 MG tablet Take 1 tablet by mouth as needed.   valsartan (DIOVAN) 320 MG tablet Take 1 tablet (320 mg total) by mouth daily.   zinc gluconate 50 MG tablet Take 50 mg by mouth daily.   [DISCONTINUED] losartan (COZAAR) 100 MG tablet Take 1 tablet (100 mg total) by mouth daily.    Allergies:   Jardiance  [empagliflozin], Lisinopril, and Statins   Social History   Tobacco Use   Smoking status: Former    Types: Pipe   Smokeless tobacco: Never   Tobacco comments:    quit smoking pipe 30 years ago.  Vaping Use   Vaping Use: Never used  Substance Use Topics   Alcohol use: No   Drug use: No    Family Hx: The patient's family history includes Arthritis in his brother; Cancer - Other in his brother; Cancer - Prostate in his father; Diabetes in his brother and mother; Diabetes type II in his son;  Rheum arthritis in his brother and sister.  Review of Systems  Constitutional: Negative for fever.  Respiratory:  Negative for cough.   Gastrointestinal:  Negative for diarrhea, hematochezia and vomiting.  Genitourinary:  Negative for hematuria.    EKGs/Labs/Other Test Reviewed:    EKG:  EKG is  ordered today.  The ekg ordered today demonstrates NSR, HR 60, normal axis, early repole in 2, 3, aVF, QTc 410, no significant change when compared to multiple prior EKGs  Recent Labs: No results found for requested labs within last 8760 hours.   Recent Lipid Panel No results for input(s): CHOL, TRIG, HDL, VLDL, LDLCALC, LDLDIRECT in the last 8760 hours.   Risk Assessment/Calculations:         Physical Exam:    VS:  BP (!) 170/60    Pulse 60    Ht '6\' 2"'$  (1.88 m)    Wt 246 lb 3.2 oz (111.7 kg)    SpO2 95%    BMI 31.61 kg/m     Wt Readings from Last 3 Encounters:  10/21/21 246 lb 3.2 oz (111.7 kg)  01/05/21 240 lb (108.9 kg)  07/05/20 242 lb 6.4 oz (110 kg)    Constitutional:      Appearance: Healthy appearance. Not in distress.  Neck:     Vascular: No JVR. JVD normal.  Pulmonary:     Effort: Pulmonary effort is normal.     Breath sounds: No wheezing. No rales.  Chest:     Chest wall: Tender to palpatation (R upper chest).  Cardiovascular:     Normal rate. Regular rhythm. Normal S1. Normal S2.      Murmurs: There is no murmur.  Edema:    Peripheral edema absent.  Abdominal:      Palpations: Abdomen is soft.  Skin:    General: Skin is warm and dry.  Neurological:     General: No focal deficit present.     Mental Status: Alert and oriented to person, place and time.     Cranial Nerves: Cranial nerves are intact.         ASSESSMENT & PLAN:   Chest pain He presents with symptoms of right-sided chest pain.  This occurred several times last week.  I was able to reproduce his symptoms somewhat when I placed my stethoscope on his chest today to examine him.  Question if his symptoms are all musculoskeletal.  He did show me tracings from his Kardia mobile device.  This did not demonstrate atrial fibrillation.  He did have what appeared to be PACs versus PVCs.  It also appeared as though he dropped a QRS from time to time.  He did have a recent cardiac monitor that demonstrated sinus rhythm and sinus bradycardia.  There were no significant arrhythmias.  He has chronic early repolarization on his electrocardiogram.  I repeated his EKG while he was in the office today.  There is no significant change.  It has been over a year since his last stress test.  He has had a lot of chest symptoms since his bypass surgery.  However, he has not had any symptoms reminiscent of his previous angina.  I have recommended proceeding with stress testing and an echocardiogram. 2D echocardiogram Lexiscan Myoview Keep follow-up as planned  Coronary artery disease involving native coronary artery of native heart without angina pectoris History of non-STEMI in 2018 followed by CABG.  Myoview in 2021 was negative for ischemia.  As noted, proceed with stress testing given recent symptoms.  Continue aspirin 81 mg daily, carvedilol 50 mg twice daily, rosuvastatin 5 mg daily.  PVC's (premature ventricular contractions) PVC burden improved significantly on higher dose beta-blocker.  Monitor in January 2023 demonstrated PVC burden 8.1%.  Proceed with echocardiogram as noted.  If his EF is down and there is no  evidence of ischemia contributing to this, it may be worthwhile sending him to EP for further management.  Essential hypertension Blood pressure is uncontrolled.  He has similar high readings at home.  Repeat by me was 150/60. DC losartan Start valsartan 320 mg daily BMET 1 month Continue amlodipine 10 mg daily, carvedilol 50 mg twice daily  Hyperlipidemia with target LDL less than 70 He showed me recent labs obtained at the [place] on 10/07/2021.  His hemoglobin was 12.8, creatinine 0.72, HDL 27, LDL 4, triglycerides 294, K+ 4.1.  His PCP is managing his lipids.  His LDL is optimal.  He remains on rosuvastatin 5 mg daily  Carotid artery disease ([place]) Pre-CABG Dopplers in 2018 demonstrated 40-59% stenosis on the left and 1-39% stenosis on the right.  Continue aspirin 81 mg daily, rosuvastatin 5 mg daily.  Obtain follow-up carotid US.        Shared Decision Making/Informed Consent The risks [chest pain, shortness of breath, cardiac arrhythmias, dizziness, blood pressure fluctuations, myocardial infarction, stroke/transient ischemic attack, nausea, vomiting, allergic reaction, radiation exposure, metallic taste sensation and life-threatening complications (estimated to be 1 in 10,000)], benefits (risk stratification, diagnosing coronary artery disease, treatment guidance) and alternatives of a nuclear stress test were discussed in detail with Mr. Salvino and he agrees to proceed.    Dispo:  Return for Scheduled Follow Up with Dr. Tamala Julian.   Medication Adjustments/Labs and Tests Ordered: Current medicines are reviewed at length with the patient today.  Concerns regarding medicines are outlined above.  Tests Ordered: Orders Placed This Encounter  Procedures   Cardiac Stress Test: Informed Consent Details: Physician/Practitioner Attestation; Transcribe to consent form and obtain patient signature   Myocardial Perfusion Imaging   EKG 12-Lead   ECHOCARDIOGRAM COMPLETE   VAS US CAROTID   Medication  Changes: Meds ordered this encounter  Medications   valsartan (DIOVAN) 320 MG tablet    Sig: Take 1 tablet (320 mg total) by mouth daily.    Dispense:  30 tablet    Refill:  [id number] Richardson Dopp, [place]  10/21/2021 11:53 AM    [facility] [place], [place], [place]  [address] Phone: [phone]; Fax: [phone]

## 2021-10-21 ENCOUNTER — Encounter: Payer: Self-pay | Admitting: Physician Assistant

## 2021-10-21 ENCOUNTER — Ambulatory Visit (INDEPENDENT_AMBULATORY_CARE_PROVIDER_SITE_OTHER): Payer: Medicare Other | Admitting: Physician Assistant

## 2021-10-21 ENCOUNTER — Other Ambulatory Visit: Payer: Self-pay | Admitting: Physician Assistant

## 2021-10-21 ENCOUNTER — Other Ambulatory Visit: Payer: Self-pay

## 2021-10-21 VITALS — BP 170/60 | HR 60 | Ht 74.0 in | Wt 246.2 lb

## 2021-10-21 DIAGNOSIS — I10 Essential (primary) hypertension: Secondary | ICD-10-CM | POA: Diagnosis not present

## 2021-10-21 DIAGNOSIS — R072 Precordial pain: Secondary | ICD-10-CM | POA: Diagnosis not present

## 2021-10-21 DIAGNOSIS — I6523 Occlusion and stenosis of bilateral carotid arteries: Secondary | ICD-10-CM | POA: Diagnosis not present

## 2021-10-21 DIAGNOSIS — I779 Disorder of arteries and arterioles, unspecified: Secondary | ICD-10-CM | POA: Insufficient documentation

## 2021-10-21 DIAGNOSIS — E1159 Type 2 diabetes mellitus with other circulatory complications: Secondary | ICD-10-CM

## 2021-10-21 DIAGNOSIS — I493 Ventricular premature depolarization: Secondary | ICD-10-CM | POA: Diagnosis not present

## 2021-10-21 DIAGNOSIS — I2510 Atherosclerotic heart disease of native coronary artery without angina pectoris: Secondary | ICD-10-CM | POA: Diagnosis not present

## 2021-10-21 DIAGNOSIS — E785 Hyperlipidemia, unspecified: Secondary | ICD-10-CM

## 2021-10-21 MED ORDER — VALSARTAN 320 MG PO TABS: 320.0000 mg | ORAL_TABLET | Freq: Every day | ORAL | 11 refills | Status: DC

## 2021-10-21 NOTE — Assessment & Plan Note (Signed)
Pre-CABG Dopplers in 2018 demonstrated 40-59% stenosis on the left and 1-39% stenosis on the right.  Continue aspirin 81 mg daily, rosuvastatin 5 mg daily.  Obtain follow-up carotid US. ?

## 2021-10-21 NOTE — Assessment & Plan Note (Signed)
PVC burden improved significantly on higher dose beta-blocker.  Monitor in January 2023 demonstrated PVC burden 8.1%.  Proceed with echocardiogram as noted.  If his EF is down and there is no evidence of ischemia contributing to this, it may be worthwhile sending him to EP for further management. ?

## 2021-10-21 NOTE — Assessment & Plan Note (Signed)
History of non-STEMI in 2018 followed by CABG.  Myoview in 2021 was negative for ischemia.  As noted, proceed with stress testing given recent symptoms.  Continue aspirin 81 mg daily, carvedilol 50 mg twice daily, rosuvastatin 5 mg daily. ?

## 2021-10-21 NOTE — Assessment & Plan Note (Signed)
He showed me recent labs obtained at the [place] on 10/07/2021.  His hemoglobin was 12.8, creatinine 0.72, HDL 27, LDL 4, triglycerides 294, K+ 4.1.  His PCP is managing his lipids.  His LDL is optimal.  He remains on rosuvastatin 5 mg daily ?

## 2021-10-21 NOTE — Assessment & Plan Note (Signed)
Blood pressure is uncontrolled.  He has similar high readings at home.  Repeat by me was 150/60. ?? DC losartan ?? Start valsartan 320 mg daily ?? BMET 1 month ?? Continue amlodipine 10 mg daily, carvedilol 50 mg twice daily ?

## 2021-10-21 NOTE — Patient Instructions (Signed)
Medication Instructions:  ? ?DISCONTINUE Losartan ? ?START Valsartan one (1) tablet by mouth ( 320 mg ) daily. ? ?*If you need a refill on your cardiac medications before your next appointment, please call your pharmacy* ? ? ?Lab Work: ? ?Your physician recommends that you return for lab work BMET/Same day as echo. You can come in on the day of your appointment anytime between 7:30-4:30. ? ?If you have labs (blood work) drawn today and your tests are completely normal, you will receive your results only by: ?MyChart Message (if you have MyChart) OR ?A paper copy in the mail ?If you have any lab test that is abnormal or we need to change your treatment, we will call you to review the results. ? ? ?Testing/Procedures: ? ?Your physician has requested that you have an echocardiogram. Echocardiography is a painless test that uses sound waves to create images of your heart. It provides your doctor with information about the size and shape of your heart and how well your heart?s chambers and valves are working. This procedure takes approximately one hour. There are no restrictions for this procedure. ? ?Your physician has requested that you have a carotid duplex. This test is an ultrasound of the carotid arteries in your neck. It looks at blood flow through these arteries that supply the brain with blood. Allow one hour for this exam. There are no restrictions or special instructions. ? ?You are scheduled for a Myocardial Perfusion Imaging Study on Friday, March 17 at 10:15 am.  ? ?Please arrive 15 minutes prior to your appointment time for registration and insurance purposes.  ? ?The test will take approximately 3 to 4 hours to complete; you may bring reading material. If someone comes with you to your appointment, they will need to remain in the main lobby due to limited space in the testing area.  ? ?How to prepare for your Myocardial Perfusion test: ? ? Do not eat or drink 3 hours prior to your test, except you may  have water.  ? ? Do not consume products containing caffeine (regular or decaffeinated) 12 hours prior to your test (ex: coffee, chocolate, soda, tea) ? ? Do bring a list of your current medications with you. If not listed below, you may take your medications as normal.  ? ? Bring any held medication to your appointment, as you may be required to take it once the test is complete.  ? ?Do wear comfortable clothes (no overalls) and walking shoes. Tennis shoes are preferred. No open toed shoes. ? ?Do not wear cologne,  aftershave or lotions (deodorant is allowed).  ? ?If these instructions are not followed, you test will have to be rescheduled.  ? ?Please report to [address] for your test. If you have questions or concerns about your appointment, please call the Nuclear Lab at [phone]. ? ?If you cannot keep your appointment, please provide 24 hour notification to the Nuclear lab to avoid a possible $50 charge to your account.  ? ?  ? ? ?Follow-Up: ?At [facility], you and your health needs are our priority.  As part of our continuing mission to provide you with exceptional heart care, we have created designated Provider Care Teams.  These Care Teams include your primary Cardiologist (physician) and Advanced Practice Providers (APPs -  Physician Assistants and Nurse Practitioners) who all work together to provide you with the care you need, when you need it. ? ?We recommend signing up for the  patient portal called "MyChart".  Sign up information is provided on this After Visit Summary.  MyChart is used to connect with patients for Virtual Visits (Telemedicine).  Patients are able to view lab/test results, encounter notes, upcoming appointments, etc.  Non-urgent messages can be sent to your provider as well.   ?To learn more about what you can do with MyChart, go to [email or web address].   ? ?Your next appointment:   ?2 month(s) ? ?The format for your next appointment:   ?In  Person ? ?Provider:  Dr. Tamala Julian.  ?Dr  ? ? ?

## 2021-10-21 NOTE — Assessment & Plan Note (Signed)
He presents with symptoms of right-sided chest pain.  This occurred several times last week.  I was able to reproduce his symptoms somewhat when I placed my stethoscope on his chest today to examine him.  Question if his symptoms are all musculoskeletal.  He did show me tracings from his Kardia mobile device.  This did not demonstrate atrial fibrillation.  He did have what appeared to be PACs versus PVCs.  It also appeared as though he dropped a QRS from time to time.  He did have a recent cardiac monitor that demonstrated sinus rhythm and sinus bradycardia.  There were no significant arrhythmias.  He has chronic early repolarization on his electrocardiogram.  I repeated his EKG while he was in the office today.  There is no significant change.  It has been over a year since his last stress test.  He has had a lot of chest symptoms since his bypass surgery.  However, he has not had any symptoms reminiscent of his previous angina.  I have recommended proceeding with stress testing and an echocardiogram. ?? 2D echocardiogram ?? Lexiscan Myoview ?? Keep follow-up as planned ?

## 2021-10-24 ENCOUNTER — Telehealth (HOSPITAL_COMMUNITY): Payer: Self-pay | Admitting: *Deleted

## 2021-10-24 NOTE — Telephone Encounter (Signed)
Patient given detailed instructions per Myocardial Perfusion Study Information Sheet for the test on 10/28/2021 at 10:15. Patient notified to arrive 15 minutes early and that it is imperative to arrive on time for appointment to keep from having the test rescheduled. ? If you need to cancel or reschedule your appointment, please call the office within 24 hours of your appointment. . Patient verbalized understanding.William Orr ? ? ?

## 2021-10-25 ENCOUNTER — Ambulatory Visit (HOSPITAL_COMMUNITY)
Admission: RE | Admit: 2021-10-25 | Discharge: 2021-10-25 | Disposition: A | Discharge status: Home / Self Care | Payer: Medicare Other | Source: Ambulatory Visit | Attending: Cardiology | Admitting: Cardiology

## 2021-10-25 ENCOUNTER — Other Ambulatory Visit: Payer: Self-pay

## 2021-10-25 DIAGNOSIS — I6523 Occlusion and stenosis of bilateral carotid arteries: Secondary | ICD-10-CM | POA: Diagnosis not present

## 2021-10-26 ENCOUNTER — Other Ambulatory Visit: Payer: Self-pay | Admitting: *Deleted

## 2021-10-26 MED ORDER — VALSARTAN 320 MG PO TABS: 320.0000 mg | ORAL_TABLET | Freq: Every day | ORAL | 3 refills | Status: AC

## 2021-10-27 DIAGNOSIS — M47816 Spondylosis without myelopathy or radiculopathy, lumbar region: Secondary | ICD-10-CM | POA: Diagnosis not present

## 2021-10-27 DIAGNOSIS — M9903 Segmental and somatic dysfunction of lumbar region: Secondary | ICD-10-CM | POA: Diagnosis not present

## 2021-10-27 DIAGNOSIS — S134XXA Sprain of ligaments of cervical spine, initial encounter: Secondary | ICD-10-CM | POA: Diagnosis not present

## 2021-10-27 DIAGNOSIS — S233XXA Sprain of ligaments of thoracic spine, initial encounter: Secondary | ICD-10-CM | POA: Diagnosis not present

## 2021-10-27 DIAGNOSIS — M9901 Segmental and somatic dysfunction of cervical region: Secondary | ICD-10-CM | POA: Diagnosis not present

## 2021-10-27 DIAGNOSIS — M9902 Segmental and somatic dysfunction of thoracic region: Secondary | ICD-10-CM | POA: Diagnosis not present

## 2021-10-28 ENCOUNTER — Ambulatory Visit (HOSPITAL_COMMUNITY): Payer: Medicare Other | Attending: Cardiology

## 2021-10-28 ENCOUNTER — Other Ambulatory Visit: Payer: Self-pay

## 2021-10-28 DIAGNOSIS — I493 Ventricular premature depolarization: Secondary | ICD-10-CM | POA: Diagnosis not present

## 2021-10-28 DIAGNOSIS — R072 Precordial pain: Secondary | ICD-10-CM | POA: Diagnosis not present

## 2021-10-28 DIAGNOSIS — I2510 Atherosclerotic heart disease of native coronary artery without angina pectoris: Secondary | ICD-10-CM | POA: Diagnosis not present

## 2021-10-28 DIAGNOSIS — I10 Essential (primary) hypertension: Secondary | ICD-10-CM | POA: Diagnosis not present

## 2021-10-28 DIAGNOSIS — E785 Hyperlipidemia, unspecified: Secondary | ICD-10-CM | POA: Diagnosis not present

## 2021-10-28 LAB — MYOCARDIAL PERFUSION IMAGING
LV dias vol: 126 mL (ref 62–150)
LV sys vol: 45 mL
Nuc Stress EF: 64 %
Peak HR: 65 {beats}/min
Rest HR: 51 {beats}/min
Rest Nuclear Isotope Dose: 10.9 mCi
SDS: 2
SRS: 0
SSS: 2
ST Depression (mm): 0 mm
Stress Nuclear Isotope Dose: 32.5 mCi
TID: 1.07

## 2021-10-28 MED ORDER — TECHNETIUM TC 99M TETROFOSMIN IV KIT
10.9000 | PACK | Freq: Once | INTRAVENOUS | Status: AC | PRN
  Administered 2021-10-28: 10.9 via INTRAVENOUS
  Filled 2021-10-28: qty 11

## 2021-10-28 MED ORDER — REGADENOSON 0.4 MG/5ML IV SOLN
0.4000 mg | Freq: Once | INTRAVENOUS | Status: AC
  Administered 2021-10-28: 0.4 mg via INTRAVENOUS

## 2021-10-28 MED ORDER — TECHNETIUM TC 99M TETROFOSMIN IV KIT
32.5000 | PACK | Freq: Once | INTRAVENOUS | Status: AC | PRN
  Administered 2021-10-28: 32.5 via INTRAVENOUS
  Filled 2021-10-28: qty 33

## 2021-10-30 ENCOUNTER — Encounter: Payer: Self-pay | Admitting: Physician Assistant

## 2021-11-01 ENCOUNTER — Telehealth: Payer: Self-pay | Admitting: Physician Assistant

## 2021-11-01 NOTE — Telephone Encounter (Signed)
-----   Message from Liliane Shi, [place] sent at 10/30/2021  9:44 PM EDT ----- ?Stress test is normal.  ?PLAN:  ?-Continue current medications/treatment plan and follow up as scheduled.  ?-Send copy to PCP ?Richardson Dopp, PA-C    ?10/30/2021 9:41 PM   ? ?

## 2021-11-01 NOTE — Telephone Encounter (Signed)
The patient has been notified of the result and verbalized understanding.  All questions (if any) were answered. ?Jeanann Lewandowsky, RMA 11/01/2021 11:10 AM  ? ?

## 2021-11-01 NOTE — Progress Notes (Signed)
Pt has been made aware of normal result and verbalized understanding.  jw

## 2021-11-01 NOTE — Telephone Encounter (Signed)
Patient returned call for results 

## 2021-11-07 ENCOUNTER — Other Ambulatory Visit: Payer: Self-pay

## 2021-11-07 ENCOUNTER — Other Ambulatory Visit: Payer: Medicare Other | Admitting: *Deleted

## 2021-11-07 ENCOUNTER — Ambulatory Visit (HOSPITAL_COMMUNITY): Payer: Medicare Other | Attending: Cardiology

## 2021-11-07 DIAGNOSIS — I10 Essential (primary) hypertension: Secondary | ICD-10-CM

## 2021-11-07 DIAGNOSIS — E785 Hyperlipidemia, unspecified: Secondary | ICD-10-CM | POA: Diagnosis not present

## 2021-11-07 DIAGNOSIS — I493 Ventricular premature depolarization: Secondary | ICD-10-CM | POA: Diagnosis not present

## 2021-11-07 DIAGNOSIS — R072 Precordial pain: Secondary | ICD-10-CM

## 2021-11-07 DIAGNOSIS — I2510 Atherosclerotic heart disease of native coronary artery without angina pectoris: Secondary | ICD-10-CM | POA: Diagnosis not present

## 2021-11-07 DIAGNOSIS — I6523 Occlusion and stenosis of bilateral carotid arteries: Secondary | ICD-10-CM

## 2021-11-07 LAB — BASIC METABOLIC PANEL
BUN/Creatinine Ratio: 17 (ref 10–24)
BUN: 16 mg/dL (ref 8–27)
CO2: 26 mmol/L (ref 20–29)
Calcium: 9.8 mg/dL (ref 8.6–10.2)
Chloride: 99 mmol/L (ref 96–106)
Creatinine, Ser: 0.96 mg/dL (ref 0.76–1.27)
Glucose: 156 mg/dL — ABNORMAL HIGH (ref 70–99)
Potassium: 4.5 mmol/L (ref 3.5–5.2)
Sodium: 137 mmol/L (ref 134–144)
eGFR: 83 mL/min/{1.73_m2} (ref >59)

## 2021-11-07 LAB — ECHOCARDIOGRAM COMPLETE
Area-P 1/2: 2.56 cm2
S' Lateral: 2.7 cm

## 2021-11-10 DIAGNOSIS — K219 Gastro-esophageal reflux disease without esophagitis: Secondary | ICD-10-CM | POA: Diagnosis not present

## 2021-11-10 DIAGNOSIS — I10 Essential (primary) hypertension: Secondary | ICD-10-CM | POA: Diagnosis not present

## 2021-11-10 DIAGNOSIS — E7800 Pure hypercholesterolemia, unspecified: Secondary | ICD-10-CM | POA: Diagnosis not present

## 2021-11-10 DIAGNOSIS — E1165 Type 2 diabetes mellitus with hyperglycemia: Secondary | ICD-10-CM | POA: Diagnosis not present

## 2021-11-23 DIAGNOSIS — Z299 Encounter for prophylactic measures, unspecified: Secondary | ICD-10-CM | POA: Diagnosis not present

## 2021-11-23 DIAGNOSIS — Z713 Dietary counseling and surveillance: Secondary | ICD-10-CM | POA: Diagnosis not present

## 2021-11-23 DIAGNOSIS — E1165 Type 2 diabetes mellitus with hyperglycemia: Secondary | ICD-10-CM | POA: Diagnosis not present

## 2021-11-23 DIAGNOSIS — I10 Essential (primary) hypertension: Secondary | ICD-10-CM | POA: Diagnosis not present

## 2021-11-23 DIAGNOSIS — Z6832 Body mass index (BMI) 32.0-32.9, adult: Secondary | ICD-10-CM | POA: Diagnosis not present

## 2021-11-24 DIAGNOSIS — I10 Essential (primary) hypertension: Secondary | ICD-10-CM | POA: Diagnosis not present

## 2021-11-24 DIAGNOSIS — E785 Hyperlipidemia, unspecified: Secondary | ICD-10-CM | POA: Diagnosis not present

## 2021-11-24 DIAGNOSIS — E669 Obesity, unspecified: Secondary | ICD-10-CM | POA: Diagnosis not present

## 2021-11-24 DIAGNOSIS — E119 Type 2 diabetes mellitus without complications: Secondary | ICD-10-CM | POA: Diagnosis not present

## 2021-11-30 DIAGNOSIS — L609 Nail disorder, unspecified: Secondary | ICD-10-CM | POA: Diagnosis not present

## 2021-11-30 DIAGNOSIS — E1140 Type 2 diabetes mellitus with diabetic neuropathy, unspecified: Secondary | ICD-10-CM | POA: Diagnosis not present

## 2021-11-30 DIAGNOSIS — L110 Acquired keratosis follicularis: Secondary | ICD-10-CM | POA: Diagnosis not present

## 2021-12-02 DIAGNOSIS — E119 Type 2 diabetes mellitus without complications: Secondary | ICD-10-CM | POA: Diagnosis not present

## 2021-12-02 DIAGNOSIS — E785 Hyperlipidemia, unspecified: Secondary | ICD-10-CM | POA: Diagnosis not present

## 2021-12-02 DIAGNOSIS — K219 Gastro-esophageal reflux disease without esophagitis: Secondary | ICD-10-CM | POA: Diagnosis not present

## 2021-12-02 DIAGNOSIS — I10 Essential (primary) hypertension: Secondary | ICD-10-CM | POA: Diagnosis not present

## 2021-12-11 DIAGNOSIS — E669 Obesity, unspecified: Secondary | ICD-10-CM | POA: Diagnosis not present

## 2021-12-11 DIAGNOSIS — E119 Type 2 diabetes mellitus without complications: Secondary | ICD-10-CM | POA: Diagnosis not present

## 2021-12-11 DIAGNOSIS — I10 Essential (primary) hypertension: Secondary | ICD-10-CM | POA: Diagnosis not present

## 2021-12-11 DIAGNOSIS — E785 Hyperlipidemia, unspecified: Secondary | ICD-10-CM | POA: Diagnosis not present

## 2021-12-29 DIAGNOSIS — S134XXA Sprain of ligaments of cervical spine, initial encounter: Secondary | ICD-10-CM | POA: Diagnosis not present

## 2021-12-29 DIAGNOSIS — M9901 Segmental and somatic dysfunction of cervical region: Secondary | ICD-10-CM | POA: Diagnosis not present

## 2021-12-29 DIAGNOSIS — M9903 Segmental and somatic dysfunction of lumbar region: Secondary | ICD-10-CM | POA: Diagnosis not present

## 2021-12-29 DIAGNOSIS — S233XXA Sprain of ligaments of thoracic spine, initial encounter: Secondary | ICD-10-CM | POA: Diagnosis not present

## 2021-12-29 DIAGNOSIS — M47816 Spondylosis without myelopathy or radiculopathy, lumbar region: Secondary | ICD-10-CM | POA: Diagnosis not present

## 2021-12-29 DIAGNOSIS — M9902 Segmental and somatic dysfunction of thoracic region: Secondary | ICD-10-CM | POA: Diagnosis not present

## 2022-01-03 NOTE — Progress Notes (Unsigned)
Cardiology Office Note:    Date:  01/04/2022   ID:  William Orr, DOB October 08, 1945, MRN [id number]  PCP:  [name], MD  Cardiologist:  Shelva Majestic, MD   Referring MD: [name], MD   Chief Complaint  Patient presents with   Coronary Artery Disease   Congestive Heart Failure    History of Present Illness:    William Orr is a 76 y.o. male with a hx of CAD with prior CABG 01/04/2017 (LIMA-LAD, SVG-D1, SVG-D2), premature ventricular contractions (14.1% burden), type 2 diabetes, primary hypertension, hyperlipidemia, and obstructive sleep apnea.  He complains of aches and pains, particularly cramping in his calves.  He wonders if this is due to some of his medications and is pinpointed the possibility it is from rosuvastatin which she is taking 5 mg daily.  He has stopped taking aspirin because he is worried about having bleeding.  We had a long discussion about the benefits of being on aspirin 81 mg daily far outweighing the risk of being on aspirin in his case since he is post coronary bypass surgery.  He is sedentary.  Does not get much sustained physical activity.  He has pain in the back of his neck and in the occipital area and is worried that this could be related to stroke or prestrokelike symptoms.  States that he frequently measures blood pressures at home that are greater than 140 mmHg.  We discussed when blood pressure should be taken.  I prefer to pressures of measured at least 2 hours after medications that can influence the blood pressure have been taken (amlodipine, carvedilol, valsartan).  Also pointed out to him that being African-American and not having a diuretic as part of his regimen is likely causing him to take more medications for blood pressure control than is really needed.  We discussed the multiple naturopathic agents that he brought in to discuss.  Many have turmeric and I explained that turmeric can cause bleeding in association with antiplatelet  therapy and anticoagulants.  I otherwise did not going to any depth because I have limited exposure to these type therapies and there is no data to support the use relative to risk reduction.  Past Medical History:  Diagnosis Date   Coronary artery disease    Myoview 3/23: no ischemia,EF 64; low risk    Diabetes mellitus without complication ([place])    History of hiatal hernia    Hyperlipidemia    Hypertension    PVC's (premature ventricular contractions) 10/20/2021   Monitor 08/2018: 14.1% Monitor 1/23: 8.1% Echo 5/18: EF 55-60   S/P CABG x 3 01/04/2017   LIMA to LAD, Sequential SVG to D1-D2, EVH via right thigh    Past Surgical History:  Procedure Laterality Date   CORONARY ARTERY BYPASS GRAFT N/A 01/04/2017   Procedure: CORONARY ARTERY BYPASS GRAFTING (CABG)X3, ON PUMP, USING LEFT INTERNAL MAMMARY ARTERY AND RIGHT GREATER SAPHENOUS VEIN HARVESTED ENDOSCOPICALLY, LIMA-LAD, SEQ SVG- DIAG 1 -DIAG 2;  Surgeon: [name], MD;  Location: [facility];  Service: Open Heart Surgery;  Laterality: N/A;   KNEE ARTHROSCOPY Bilateral    LEFT HEART CATH AND CORONARY ANGIOGRAPHY N/A 01/02/2017   Procedure: Left Heart Cath and Coronary Angiography;  Surgeon: [name], MD;  Location: [facility];  Service: Cardiovascular;  Laterality: N/A;   TEE WITHOUT CARDIOVERSION N/A 01/04/2017   Procedure: TRANSESOPHAGEAL ECHOCARDIOGRAM (TEE);  Surgeon: [name], MD;  Location: [facility];  Service: Open Heart Surgery;  Laterality: N/A;  Current Medications: Current Meds  Medication Sig   acetaminophen (TYLENOL) 650 MG CR tablet Take 650 mg by mouth every 8 (eight) hours as needed for pain.   Alogliptin Benzoate 25 MG TABS Take 1 tablet by mouth every morning.   amLODipine (NORVASC) 10 MG tablet Take 10 mg by mouth daily.   Apple Cider Vinegar 500 MG TABS Take 3 tablets by mouth daily.   aspirin EC 81 MG EC tablet Take 1 tablet (81 mg total) by mouth daily.   carvedilol (COREG) 25 MG tablet Take  25 mg by mouth 2 (two) times daily with a meal. Pt. Takes 25 mg 1 tablet once a day.   cetirizine (ZYRTEC) 10 MG tablet Take 10 mg by mouth daily as needed for allergies.    Cholecalciferol (VITAMIN D-3) 125 MCG (5000 UT) TABS Take 5,000 Units by mouth daily. Pt takes 2 capsule once a day.   Cholecalciferol (VITAMIN D-3) 5000 UNITS TABS Take 10,000 Units by mouth every morning.    COPPER PO Take 1 capsule by mouth daily.   fluticasone (FLONASE) 50 MCG/ACT nasal spray Place 1 spray into both nostrils daily as needed (seasonal allergies).    magnesium oxide (MAG-OX) 400 MG tablet Take 400 mg by mouth every morning.   metFORMIN (GLUCOPHAGE) 850 MG tablet Take 850 mg by mouth 2 (two) times daily with a meal.    Misc Natural Products (TURMERIC CURCUMIN) CAPS Take by mouth 2 (two) times daily.   Multiple Vitamin (MULTIVITAMIN WITH MINERALS) TABS tablet Take 2 tablets by mouth every morning.    Multiple Vitamin (MULTIVITAMIN) tablet Take 1 tablet by mouth daily.   NON FORMULARY Take 2 capsules by mouth daily. Amazin Grape: MUscadine Grapeseed/Skin   Omega 3-6-9 Fatty Acids (ADULT OMEGA PLUS DHA PO) Take 100 mg by mouth daily.   Omega 3-6-9 Fatty Acids (OMEGA-3-6-9 PO) Take 200 mg by mouth daily. Pt. Take 3 tables 200 mg of Omega Plus once a day.   Omega-3 Fatty Acids (OMEGA-3 FISH OIL PO) Take 2 capsules by mouth daily.   OVER THE COUNTER MEDICATION daily. Blood boost   OVER THE COUNTER MEDICATION Pt take the supplement life tones for uric acid support. Take 1/2 tsb with water once a day.   OVER THE COUNTER MEDICATION 300 mg daily. Pt. Takes 2 CBD gummies once a day.   OVER THE COUNTER MEDICATION Take by mouth. Pt take Provent Ultra Blend to treat prostate once a day.   OVER THE COUNTER MEDICATION Take by mouth. Pt takes Gluco 24 supplement 1 TAB daily.   OVER THE COUNTER MEDICATION 1 mL. Pt takes the supplement call Wisdom once a day.   OVER THE COUNTER MEDICATION Take 1 capsule by mouth daily. Pt  takes the supplement Red Boost daily.   OVER THE COUNTER MEDICATION Take 1 capsule by mouth every other day. Pt takes the supplement Osa Craver for glucose support.   OVER THE COUNTER MEDICATION Take 2 capsules by mouth daily. The   OVER THE COUNTER MEDICATION Take 2 capsules by mouth daily. The supplement Nitric Oxide for bloodpressure once a day   pantoprazole (PROTONIX) 40 MG tablet Take 1 tablet by mouth every morning.    rosuvastatin (CRESTOR) 10 MG tablet Take 0.5 mg by mouth daily.   traZODone (DESYREL) 50 MG tablet Take 1 tablet by mouth as needed.   valsartan (DIOVAN) 320 MG tablet Take 1 tablet (320 mg total) by mouth daily.   zinc gluconate 50 MG tablet Take  50 mg by mouth daily.     Allergies:   Jardiance [empagliflozin], Lisinopril, and Statins   Social History   Socioeconomic History   Marital status: Married    Spouse name: Not on file   Number of children: Not on file   Years of education: Not on file   Highest education level: Not on file  Occupational History   Not on file  Tobacco Use   Smoking status: Former    Types: Pipe   Smokeless tobacco: Never   Tobacco comments:    quit smoking pipe 30 years ago.  Vaping Use   Vaping Use: Never used  Substance and Sexual Activity   Alcohol use: No   Drug use: No   Sexual activity: Not Currently  Other Topics Concern   Not on file  Social History Narrative   Not on file   Social Determinants of Health   Financial Resource Strain: Not on file  Food Insecurity: Not on file  Transportation Needs: Not on file  Physical Activity: Not on file  Stress: Not on file  Social Connections: Not on file     Family History: The patient's family history includes Arthritis in his brother; Cancer - Other in his brother; Cancer - Prostate in his father; Diabetes in his brother and mother; Diabetes type II in his son; Rheum arthritis in his brother and sister.  ROS:   Please see the history of present illness.    Posterior  cervical discomfort, occipital headaches, muscle cramps, fear about aspirin use, and intermittent use of CPAP all identified today.  All other systems reviewed and are negative.  EKGs/Labs/Other Studies Reviewed:    The following studies were reviewed today:  Nuclear stress test 10/28/2021: Study Highlights      The study is normal. The study is low risk.   No ST deviation was noted.   LV perfusion is normal. There is no evidence of ischemia. There is no evidence of infarction.   Left ventricular function is normal. Nuclear stress EF: 64 %. The left ventricular ejection fraction is normal (55-65%). End diastolic cavity size is normal.   Prior study available for comparison from 06/09/2020.   Normal stress nuclear study with apical thinning but no ischemia.  Gated ejection fraction 64% with normal wall motion.   Coronary angiography June 2018: Diagnostic Dominance: Right    2D Doppler echocardiogram 11/07/2021: IMPRESSIONS     1. Left ventricular ejection fraction, by estimation, is 60 to 65%. The  left ventricle has normal function. The left ventricle has no regional  wall motion abnormalities. There is moderate concentric left ventricular  hypertrophy. Left ventricular  diastolic parameters are consistent with Grade I diastolic dysfunction  (impaired relaxation).   2. Right ventricular systolic function is normal. The right ventricular  size is mildly enlarged. Tricuspid regurgitation signal is inadequate for  assessing PA pressure.   3. Left atrial size was mildly dilated.   4. The mitral valve is normal in structure. Trivial mitral valve  regurgitation.   5. The aortic valve is tricuspid. There is mild calcification of the  aortic valve. There is mild thickening of the aortic valve. Aortic valve  regurgitation is not visualized. Aortic valve sclerosis/calcification is  present, without any evidence of  aortic stenosis.   Comparison(s): No significant change from prior  study.   PVC burden based on 13-day monitor December 2022: Study Highlights    Normal sinus rhythm and sinus bradycardia Ventricular bigeminy and trigeminy PVC burden  8.1%. 438 NSVT runs with longest 7 beats. Symptoms associated with ventricular ectopy.      EKG:  EKG the most recent tracing is from October 21, 2021. The tracing demonstrated sinus rhythm with PR interval 204 ms and was otherwise normal with the exception of left atrial abnormality.  Recent Labs: 11/07/2021: BUN 16; Creatinine, Ser 0.96; Potassium 4.5; Sodium 137  Recent Lipid Panel    Component Value Date/Time   CHOL 101 10/27/2019 1052   TRIG 181 (H) 10/27/2019 1052   HDL 25 (L) 10/27/2019 1052   CHOLHDL 4.0 10/27/2019 1052   CHOLHDL 4.5 01/01/2017 0042   VLDL UNABLE TO CALCULATE IF TRIGLYCERIDE OVER 400 mg/dL 01/01/2017 0042   LDLCALC 46 10/27/2019 1052    Physical Exam:    VS:  BP (!) 148/64   Pulse 95   Ht '6\' 2"'$  (1.88 m)   Wt 251 lb 6.4 oz (114 kg)   SpO2 95%   BMI 32.28 kg/m     Wt Readings from Last 3 Encounters:  01/04/22 251 lb 6.4 oz (114 kg)  10/28/21 246 lb (111.6 kg)  10/21/21 246 lb 3.2 oz (111.7 kg)     GEN: Morbidly obese. No acute distress HEENT: Normal NECK: No JVD. LYMPHATICS: No lymphadenopathy CARDIAC: No murmur. RRR S4 but no S3 gallop, or edema. VASCULAR:  Normal Pulses. No bruits. RESPIRATORY:  Clear to auscultation without rales, wheezing or rhonchi  ABDOMEN: Soft, non-tender, non-distended, No pulsatile mass, MUSCULOSKELETAL: No deformity  SKIN: Warm and dry NEUROLOGIC:  Alert and oriented x 3 PSYCHIATRIC:  Normal affect   ASSESSMENT:    1. S/P CABG x 3   2. Hyperlipidemia with target LDL less than 70   3. Essential hypertension   4. PVC's (premature ventricular contractions)   5. Bilateral carotid artery stenosis   6. OSA on CPAP   7. Controlled type 2 diabetes mellitus without complication, without long-term current use of insulin (HCC)    PLAN:    In order  of problems listed above:  Aggressive secondary prevention including resumption of coated aspirin 81 mg/day (protection for vein graft patency), LDL target less than 55 (with statin or transition to PCSK9 if statin intolerant), consideration of Vascepa to give added risk reduction since his triglyceride is above 145.  Additionally the blood pressure target should be 130/80.  The next addition or reorganization of his antihypertensive regimen should be to add low-dose diuretic therapy.  This may allow reduction in Norvasc intensity.  He should remain on Diovan and will not be able to tolerate a increase in carvedilol but the dose should be continued as noted. Trial off rosuvastatin 5 mg/day.  If muscle cramping goes away and recurs with resumption, she had graduated PCSK9 therapy.  LDL target for him is less than 55 based on the new consensus document. Consider adding low-dose chlorthalidone 12.5 mg/day. Relatively high PVC burden of 8.1%.\ Continue secondary prevention as outlined above Continue CPAP and follow-up with [name] Target A1c less than 7.  He has tried [place] and had urinary infection.  Would recommend Ozempic which also has cardioprotective effects.  Overall education and awareness concerning secondary risk prevention was discussed in detail: LDL less than 70, hemoglobin A1c less than 7, blood pressure target less than 130/80 mmHg, >150 minutes of moderate aerobic activity per week, avoidance of smoking, weight control (via diet and exercise), and continued surveillance/management of/for obstructive sleep apnea.  I have recommended that he follow-up with Dr. Ellouise Newer.  Dr.  Claiborne Billings will be more able to manage his comprehensive cardiovascular care since he is also board-certified in sleep medicine.  I will also likely retire in advance of [name].  Understanding this the patient agreed to follow-up with his cardiologist Dr. Shelva Majestic.  Medication Adjustments/Labs and Tests  Ordered: Current medicines are reviewed at length with the patient today.  Concerns regarding medicines are outlined above.  No orders of the defined types were placed in this encounter.  No orders of the defined types were placed in this encounter.   There are no Patient Instructions on file for this visit.   Signed, Sinclair Grooms, MD  01/04/2022 12:03 PM    [facility]

## 2022-01-04 ENCOUNTER — Encounter: Payer: Self-pay | Admitting: Interventional Cardiology

## 2022-01-04 ENCOUNTER — Ambulatory Visit (INDEPENDENT_AMBULATORY_CARE_PROVIDER_SITE_OTHER): Payer: Medicare Other | Admitting: Interventional Cardiology

## 2022-01-04 VITALS — BP 148/64 | HR 95 | Ht 74.0 in | Wt 251.4 lb

## 2022-01-04 DIAGNOSIS — G4733 Obstructive sleep apnea (adult) (pediatric): Secondary | ICD-10-CM

## 2022-01-04 DIAGNOSIS — E785 Hyperlipidemia, unspecified: Secondary | ICD-10-CM

## 2022-01-04 DIAGNOSIS — E119 Type 2 diabetes mellitus without complications: Secondary | ICD-10-CM

## 2022-01-04 DIAGNOSIS — Z951 Presence of aortocoronary bypass graft: Secondary | ICD-10-CM | POA: Diagnosis not present

## 2022-01-04 DIAGNOSIS — I6523 Occlusion and stenosis of bilateral carotid arteries: Secondary | ICD-10-CM

## 2022-01-04 DIAGNOSIS — Z9989 Dependence on other enabling machines and devices: Secondary | ICD-10-CM

## 2022-01-04 DIAGNOSIS — I10 Essential (primary) hypertension: Secondary | ICD-10-CM

## 2022-01-04 DIAGNOSIS — I493 Ventricular premature depolarization: Secondary | ICD-10-CM | POA: Diagnosis not present

## 2022-01-04 NOTE — Patient Instructions (Signed)
Medication Instructions:  **OK to stop taking Rosuvastatin for 2-3 weeks to see if muscle pain goes away, then restart. If muscle pain does not improve when restarting Rosuvastatin will need to discuss other options with [name].**  Your physician recommends that you continue on your current medications as directed. Please refer to the Current Medication list given to you today.  *If you need a refill on your cardiac medications before your next appointment, please call your pharmacy*  Lab Work: NONE  Testing/Procedures: NONE  Follow-Up: At [facility], you and your health needs are our priority.  As part of our continuing mission to provide you with exceptional heart care, we have created designated Provider Care Teams.  These Care Teams include your primary Cardiologist (physician) and Advanced Practice Providers (APPs -  Physician Assistants and Nurse Practitioners) who all work together to provide you with the care you need, when you need it.  Your next appointment:   6-9 month(s)  The format for your next appointment:   In Person  Provider:   Shelva Majestic, MD {  Other Instructions Vascepa (this is the medication recommended to lower your triglycerides, please discuss with [facility]).  Your LDL target is less than 55.   Important Information About Sugar

## 2022-01-11 DIAGNOSIS — I10 Essential (primary) hypertension: Secondary | ICD-10-CM | POA: Diagnosis not present

## 2022-01-11 DIAGNOSIS — E119 Type 2 diabetes mellitus without complications: Secondary | ICD-10-CM | POA: Diagnosis not present

## 2022-01-11 DIAGNOSIS — I2510 Atherosclerotic heart disease of native coronary artery without angina pectoris: Secondary | ICD-10-CM | POA: Diagnosis not present

## 2022-01-11 DIAGNOSIS — K219 Gastro-esophageal reflux disease without esophagitis: Secondary | ICD-10-CM | POA: Diagnosis not present

## 2022-01-24 DIAGNOSIS — E1165 Type 2 diabetes mellitus with hyperglycemia: Secondary | ICD-10-CM | POA: Diagnosis not present

## 2022-01-24 DIAGNOSIS — Z6832 Body mass index (BMI) 32.0-32.9, adult: Secondary | ICD-10-CM | POA: Diagnosis not present

## 2022-01-24 DIAGNOSIS — R5383 Other fatigue: Secondary | ICD-10-CM | POA: Diagnosis not present

## 2022-01-24 DIAGNOSIS — Z299 Encounter for prophylactic measures, unspecified: Secondary | ICD-10-CM | POA: Diagnosis not present

## 2022-01-24 DIAGNOSIS — E7800 Pure hypercholesterolemia, unspecified: Secondary | ICD-10-CM | POA: Diagnosis not present

## 2022-01-24 DIAGNOSIS — Z7189 Other specified counseling: Secondary | ICD-10-CM | POA: Diagnosis not present

## 2022-01-24 DIAGNOSIS — Z1331 Encounter for screening for depression: Secondary | ICD-10-CM | POA: Diagnosis not present

## 2022-01-24 DIAGNOSIS — Z1339 Encounter for screening examination for other mental health and behavioral disorders: Secondary | ICD-10-CM | POA: Diagnosis not present

## 2022-01-24 DIAGNOSIS — Z0000 Encounter for general adult medical examination without abnormal findings: Secondary | ICD-10-CM | POA: Diagnosis not present

## 2022-01-24 DIAGNOSIS — I10 Essential (primary) hypertension: Secondary | ICD-10-CM | POA: Diagnosis not present

## 2022-01-24 DIAGNOSIS — Z125 Encounter for screening for malignant neoplasm of prostate: Secondary | ICD-10-CM | POA: Diagnosis not present

## 2022-01-30 DIAGNOSIS — E785 Hyperlipidemia, unspecified: Secondary | ICD-10-CM | POA: Diagnosis not present

## 2022-01-30 DIAGNOSIS — I10 Essential (primary) hypertension: Secondary | ICD-10-CM | POA: Diagnosis not present

## 2022-01-30 DIAGNOSIS — K219 Gastro-esophageal reflux disease without esophagitis: Secondary | ICD-10-CM | POA: Diagnosis not present

## 2022-01-30 DIAGNOSIS — E119 Type 2 diabetes mellitus without complications: Secondary | ICD-10-CM | POA: Diagnosis not present

## 2022-02-10 DIAGNOSIS — I10 Essential (primary) hypertension: Secondary | ICD-10-CM | POA: Diagnosis not present

## 2022-02-10 DIAGNOSIS — E785 Hyperlipidemia, unspecified: Secondary | ICD-10-CM | POA: Diagnosis not present

## 2022-02-10 DIAGNOSIS — E119 Type 2 diabetes mellitus without complications: Secondary | ICD-10-CM | POA: Diagnosis not present

## 2022-02-10 DIAGNOSIS — K219 Gastro-esophageal reflux disease without esophagitis: Secondary | ICD-10-CM | POA: Diagnosis not present

## 2022-02-23 DIAGNOSIS — Z23 Encounter for immunization: Secondary | ICD-10-CM | POA: Diagnosis not present

## 2022-03-01 DIAGNOSIS — E785 Hyperlipidemia, unspecified: Secondary | ICD-10-CM | POA: Diagnosis not present

## 2022-03-01 DIAGNOSIS — I10 Essential (primary) hypertension: Secondary | ICD-10-CM | POA: Diagnosis not present

## 2022-03-01 DIAGNOSIS — E669 Obesity, unspecified: Secondary | ICD-10-CM | POA: Diagnosis not present

## 2022-03-01 DIAGNOSIS — E1140 Type 2 diabetes mellitus with diabetic neuropathy, unspecified: Secondary | ICD-10-CM | POA: Diagnosis not present

## 2022-03-01 DIAGNOSIS — E119 Type 2 diabetes mellitus without complications: Secondary | ICD-10-CM | POA: Diagnosis not present

## 2022-03-01 DIAGNOSIS — M9903 Segmental and somatic dysfunction of lumbar region: Secondary | ICD-10-CM | POA: Diagnosis not present

## 2022-03-01 DIAGNOSIS — L110 Acquired keratosis follicularis: Secondary | ICD-10-CM | POA: Diagnosis not present

## 2022-03-01 DIAGNOSIS — M47816 Spondylosis without myelopathy or radiculopathy, lumbar region: Secondary | ICD-10-CM | POA: Diagnosis not present

## 2022-03-01 DIAGNOSIS — M9902 Segmental and somatic dysfunction of thoracic region: Secondary | ICD-10-CM | POA: Diagnosis not present

## 2022-03-01 DIAGNOSIS — L609 Nail disorder, unspecified: Secondary | ICD-10-CM | POA: Diagnosis not present

## 2022-03-01 DIAGNOSIS — M9901 Segmental and somatic dysfunction of cervical region: Secondary | ICD-10-CM | POA: Diagnosis not present

## 2022-03-01 DIAGNOSIS — S233XXA Sprain of ligaments of thoracic spine, initial encounter: Secondary | ICD-10-CM | POA: Diagnosis not present

## 2022-03-01 DIAGNOSIS — S134XXA Sprain of ligaments of cervical spine, initial encounter: Secondary | ICD-10-CM | POA: Diagnosis not present

## 2022-03-13 DIAGNOSIS — E119 Type 2 diabetes mellitus without complications: Secondary | ICD-10-CM | POA: Diagnosis not present

## 2022-03-13 DIAGNOSIS — E1165 Type 2 diabetes mellitus with hyperglycemia: Secondary | ICD-10-CM | POA: Diagnosis not present

## 2022-03-13 DIAGNOSIS — E785 Hyperlipidemia, unspecified: Secondary | ICD-10-CM | POA: Diagnosis not present

## 2022-03-13 DIAGNOSIS — K219 Gastro-esophageal reflux disease without esophagitis: Secondary | ICD-10-CM | POA: Diagnosis not present

## 2022-03-13 DIAGNOSIS — E7800 Pure hypercholesterolemia, unspecified: Secondary | ICD-10-CM | POA: Diagnosis not present

## 2022-03-13 DIAGNOSIS — I10 Essential (primary) hypertension: Secondary | ICD-10-CM | POA: Diagnosis not present

## 2022-03-31 DIAGNOSIS — E119 Type 2 diabetes mellitus without complications: Secondary | ICD-10-CM | POA: Diagnosis not present

## 2022-03-31 DIAGNOSIS — I10 Essential (primary) hypertension: Secondary | ICD-10-CM | POA: Diagnosis not present

## 2022-03-31 DIAGNOSIS — E785 Hyperlipidemia, unspecified: Secondary | ICD-10-CM | POA: Diagnosis not present

## 2022-03-31 DIAGNOSIS — E669 Obesity, unspecified: Secondary | ICD-10-CM | POA: Diagnosis not present

## 2022-04-13 DIAGNOSIS — I10 Essential (primary) hypertension: Secondary | ICD-10-CM | POA: Diagnosis not present

## 2022-04-13 DIAGNOSIS — E119 Type 2 diabetes mellitus without complications: Secondary | ICD-10-CM | POA: Diagnosis not present

## 2022-04-30 DIAGNOSIS — E119 Type 2 diabetes mellitus without complications: Secondary | ICD-10-CM | POA: Diagnosis not present

## 2022-04-30 DIAGNOSIS — I10 Essential (primary) hypertension: Secondary | ICD-10-CM | POA: Diagnosis not present

## 2022-05-04 DIAGNOSIS — I10 Essential (primary) hypertension: Secondary | ICD-10-CM | POA: Diagnosis not present

## 2022-05-04 DIAGNOSIS — I700 Atherosclerosis of aorta: Secondary | ICD-10-CM | POA: Diagnosis not present

## 2022-05-04 DIAGNOSIS — Z6832 Body mass index (BMI) 32.0-32.9, adult: Secondary | ICD-10-CM | POA: Diagnosis not present

## 2022-05-04 DIAGNOSIS — G720 Drug-induced myopathy: Secondary | ICD-10-CM | POA: Diagnosis not present

## 2022-05-04 DIAGNOSIS — E1165 Type 2 diabetes mellitus with hyperglycemia: Secondary | ICD-10-CM | POA: Diagnosis not present

## 2022-05-04 DIAGNOSIS — Z299 Encounter for prophylactic measures, unspecified: Secondary | ICD-10-CM | POA: Diagnosis not present

## 2022-05-13 DIAGNOSIS — I10 Essential (primary) hypertension: Secondary | ICD-10-CM | POA: Diagnosis not present

## 2022-05-13 DIAGNOSIS — E119 Type 2 diabetes mellitus without complications: Secondary | ICD-10-CM | POA: Diagnosis not present

## 2022-05-17 DIAGNOSIS — E1140 Type 2 diabetes mellitus with diabetic neuropathy, unspecified: Secondary | ICD-10-CM | POA: Diagnosis not present

## 2022-05-17 DIAGNOSIS — L609 Nail disorder, unspecified: Secondary | ICD-10-CM | POA: Diagnosis not present

## 2022-05-17 DIAGNOSIS — L110 Acquired keratosis follicularis: Secondary | ICD-10-CM | POA: Diagnosis not present

## 2022-05-30 DIAGNOSIS — E119 Type 2 diabetes mellitus without complications: Secondary | ICD-10-CM | POA: Diagnosis not present

## 2022-05-30 DIAGNOSIS — I10 Essential (primary) hypertension: Secondary | ICD-10-CM | POA: Diagnosis not present

## 2022-06-13 DIAGNOSIS — E119 Type 2 diabetes mellitus without complications: Secondary | ICD-10-CM | POA: Diagnosis not present

## 2022-06-13 DIAGNOSIS — I10 Essential (primary) hypertension: Secondary | ICD-10-CM | POA: Diagnosis not present

## 2022-06-16 ENCOUNTER — Encounter: Payer: Self-pay | Admitting: Physician Assistant

## 2022-06-16 ENCOUNTER — Ambulatory Visit: Payer: Medicare Other | Attending: Physician Assistant | Admitting: Physician Assistant

## 2022-06-16 VITALS — BP 128/60 | HR 53 | Ht 74.0 in | Wt 246.0 lb

## 2022-06-16 DIAGNOSIS — Z951 Presence of aortocoronary bypass graft: Secondary | ICD-10-CM | POA: Diagnosis not present

## 2022-06-16 DIAGNOSIS — E785 Hyperlipidemia, unspecified: Secondary | ICD-10-CM | POA: Diagnosis not present

## 2022-06-16 DIAGNOSIS — E119 Type 2 diabetes mellitus without complications: Secondary | ICD-10-CM | POA: Diagnosis not present

## 2022-06-16 DIAGNOSIS — I10 Essential (primary) hypertension: Secondary | ICD-10-CM | POA: Diagnosis not present

## 2022-06-16 DIAGNOSIS — I25810 Atherosclerosis of coronary artery bypass graft(s) without angina pectoris: Secondary | ICD-10-CM | POA: Diagnosis not present

## 2022-06-16 DIAGNOSIS — I6523 Occlusion and stenosis of bilateral carotid arteries: Secondary | ICD-10-CM | POA: Diagnosis not present

## 2022-06-16 DIAGNOSIS — I2510 Atherosclerotic heart disease of native coronary artery without angina pectoris: Secondary | ICD-10-CM

## 2022-06-16 MED ORDER — CHLORTHALIDONE 25 MG PO TABS: 25.0000 mg | ORAL_TABLET | Freq: Every day | ORAL | 3 refills | Status: AC

## 2022-06-16 NOTE — Patient Instructions (Addendum)
Medication Instructions:    Stop taking  Hydrochlorothiazide    Continue taking Praluent injections every 2 weeks  Fish oil tablets  2 tablets  twice a day    Start taking 25 mg Chlorthalidone  daily    *If you need a refill on your cardiac medications before your next appointment, please call your pharmacy*   Lab Work:  BMP in 2 weeks after starting Chlorthalidone  at [facility] in [place]    If you have labs (blood work) drawn today and your tests are completely normal, you will receive your results only by: [place] (if you have MyChart) OR A paper copy in the mail If you have any lab test that is abnormal or we need to change your treatment, we will call you to review the results.   Testing/Procedures: Not needed   Follow-Up: At [facility], you and your health needs are our priority.  As part of our continuing mission to provide you with exceptional heart care, we have created designated Provider Care Teams.  These Care Teams include your primary Cardiologist (physician) and Advanced Practice Providers (APPs -  Physician Assistants and Nurse Practitioners) who all work together to provide you with the care you need, when you need it.     Your next appointment:   4 month(s)  along with hs wife March 2024  The format for your next appointment:   In Person  Provider:   Shelva Majestic, MD    Other Instructions   Continue to monitor   your chest pain if become worse contact office

## 2022-06-16 NOTE — Progress Notes (Signed)
Cardiology Office Note:    Date:  06/18/2022   ID:  William Orr, DOB Jun 06, 1946, MRN [id number]  PCP:  Kirstie Peri, MD   [facility] Providers Cardiologist:  Nicki Guadalajara, MD     Referring MD: Kirstie Peri, MD   Chief Complaint  Patient presents with   Follow-up    1 year.   Chest Pain    Tightnes as well.   Shortness of Breath    History of Present Illness:    William Orr is a 76 y.o. male with a hx of CAD s/p CABG 01/04/2017 (LIMA-LAD, SVG-D1, SVG-D2), frequent PVCs (14.1% burden), DM2, hypertension, hyperlipidemia and obstructive sleep apnea.  Myoview obtained in March 2023 was low risk, no evidence of ischemia, EF 64%.  Most recent echocardiogram obtained on 11/07/2021 showed EF 60 to 65%, no regional wall motion abnormality, grade 1 DD, trivial MR.  Patient presents today for cardiology follow-up.  He is enrolled in [facility].  Based on the most recent report, his average blood pressure at home was in the 150s.  When he came in today, his blood pressure is better in the 120s.  On repeat manual recheck, blood pressure did go up to 146/70.  I recommended switching his HCTZ to chlorthalidone 25 mg daily for better blood pressure control.  He will need a basic metabolic panel in 2 weeks to follow-up on renal function and electrolyte.  [facility] 2 months ago.  He is no longer on Crestor.  He is tolerating Praluent without any side effect.  His HDL is low, his PCP recommended him to increase his daily activity to at least 5000 steps.  He is currently only doing half of that amount.  I also encouraged him to increase walking.  He is also complaining of intermittent chest pain on the left side of the chest.  This only lasted about a second each time.  It is worse when he takes a deep breath.  Symptoms sounds very atypical, I recommended continue observation for the time being.   Past Medical History:  Diagnosis Date   Coronary artery disease     Myoview 3/23: no ischemia,EF 64; low risk    Diabetes mellitus without complication (HCC)    History of hiatal hernia    Hyperlipidemia    Hypertension    PVC's (premature ventricular contractions) 10/20/2021   Monitor 08/2018: 14.1% Monitor 1/23: 8.1% Echo 5/18: EF 55-60   S/P CABG x 3 01/04/2017   LIMA to LAD, Sequential SVG to D1-D2, EVH via right thigh    Past Surgical History:  Procedure Laterality Date   CORONARY ARTERY BYPASS GRAFT N/A 01/04/2017   Procedure: CORONARY ARTERY BYPASS GRAFTING (CABG)X3, ON PUMP, USING LEFT INTERNAL MAMMARY ARTERY AND RIGHT GREATER SAPHENOUS VEIN HARVESTED ENDOSCOPICALLY, LIMA-LAD, SEQ SVG- DIAG 1 -DIAG 2;  Surgeon: Purcell Nails, MD;  Location: [facility] OR;  Service: Open Heart Surgery;  Laterality: N/A;   KNEE ARTHROSCOPY Bilateral    LEFT HEART CATH AND CORONARY ANGIOGRAPHY N/A 01/02/2017   Procedure: Left Heart Cath and Coronary Angiography;  Surgeon: Lyn Records, MD;  Location: [facility] INVASIVE CV LAB;  Service: Cardiovascular;  Laterality: N/A;   TEE WITHOUT CARDIOVERSION N/A 01/04/2017   Procedure: TRANSESOPHAGEAL ECHOCARDIOGRAM (TEE);  Surgeon: Purcell Nails, MD;  Location: [facility] OR;  Service: Open Heart Surgery;  Laterality: N/A;    Current Medications: Current Meds  Medication Sig   acetaminophen (TYLENOL) 650 MG CR tablet Take 650  mg by mouth every 8 (eight) hours as needed for pain.   Alogliptin Benzoate 25 MG TABS Take 1 tablet by mouth every morning.   amLODipine (NORVASC) 10 MG tablet Take 10 mg by mouth daily.   Apple Cider Vinegar 500 MG TABS Take 3 tablets by mouth daily.   aspirin EC 81 MG EC tablet Take 1 tablet (81 mg total) by mouth daily.   carvedilol (COREG) 25 MG tablet Take 50 mg by mouth 2 (two) times daily with a meal. Pt. Takes 25 mg 1 tablet once a day.   cetirizine (ZYRTEC) 10 MG tablet Take 10 mg by mouth daily as needed for allergies.    chlorthalidone (HYGROTON) 25 MG tablet Take 1 tablet (25 mg total) by mouth daily.    Cholecalciferol (VITAMIN D-3) 125 MCG (5000 UT) TABS Take 5,000 Units by mouth daily. Pt takes 2 capsule once a day.   Cholecalciferol (VITAMIN D-3) 5000 UNITS TABS Take 10,000 Units by mouth every morning.    COPPER PO Take 1 capsule by mouth daily.   fluticasone (FLONASE) 50 MCG/ACT nasal spray Place 1 spray into both nostrils daily as needed (seasonal allergies).    glipiZIDE (GLUCOTROL) 10 MG tablet Take 10 mg by mouth 2 (two) times daily before a meal.   magnesium oxide (MAG-OX) 400 MG tablet Take 400 mg by mouth every morning.   metFORMIN (GLUCOPHAGE) 850 MG tablet Take 850 mg by mouth 2 (two) times daily with a meal.    Misc Natural Products (TURMERIC CURCUMIN) CAPS Take by mouth 2 (two) times daily.   Multiple Vitamin (MULTIVITAMIN WITH MINERALS) TABS tablet Take 2 tablets by mouth every morning.    Multiple Vitamin (MULTIVITAMIN) tablet Take 1 tablet by mouth daily.   NON FORMULARY Take 2 capsules by mouth daily. Amazin Grape: MUscadine Grapeseed/Skin   Omega-3 Fatty Acids (FISH OIL) 1000 MG CAPS Take 2,000 mg by mouth 2 (two) times daily.   OVER THE COUNTER MEDICATION daily. Blood boost   OVER THE COUNTER MEDICATION Pt take the supplement life tones for uric acid support. Take 1/2 tsb with water once a day.   OVER THE COUNTER MEDICATION 300 mg daily. Pt. Takes 2 CBD gummies once a day.   OVER THE COUNTER MEDICATION Take by mouth. Pt take Provent Ultra Blend to treat prostate once a day.   OVER THE COUNTER MEDICATION Take by mouth. Pt takes Gluco 24 supplement 1 TAB daily.   OVER THE COUNTER MEDICATION 1 mL. Pt takes the supplement call Wisdom once a day.   OVER THE COUNTER MEDICATION Take 1 capsule by mouth daily. Pt takes the supplement Red Boost daily.   OVER THE COUNTER MEDICATION Take 1 capsule by mouth every other day. Pt takes the supplement Myles Rosenthal for glucose support.   OVER THE COUNTER MEDICATION Take 2 capsules by mouth daily. The   OVER THE COUNTER MEDICATION Take 2  capsules by mouth daily. The supplement Nitric Oxide for bloodpressure once a day   pantoprazole (PROTONIX) 40 MG tablet Take 1 tablet by mouth every morning.    traZODone (DESYREL) 50 MG tablet Take 1 tablet by mouth as needed.   valsartan (DIOVAN) 320 MG tablet Take 1 tablet (320 mg total) by mouth daily.   zinc gluconate 50 MG tablet Take 50 mg by mouth daily.   [DISCONTINUED] hydrochlorothiazide (HYDRODIURIL) 25 MG tablet Take 12.5 mg by mouth daily.   [DISCONTINUED] Omega 3-6-9 Fatty Acids (ADULT OMEGA PLUS DHA PO) Take 100 mg  by mouth daily.   [DISCONTINUED] Omega 3-6-9 Fatty Acids (OMEGA-3-6-9 PO) Take 200 mg by mouth daily. Pt. Take 3 tables 200 mg of Omega Plus once a day.   [DISCONTINUED] Omega-3 Fatty Acids (OMEGA-3 FISH OIL PO) Take 2 capsules by mouth daily.   [DISCONTINUED] rosuvastatin (CRESTOR) 10 MG tablet Take 0.5 mg by mouth daily.     Allergies:   Jardiance [empagliflozin], Lisinopril, and Statins   Social History   Socioeconomic History   Marital status: Married    Spouse name: Not on file   Number of children: Not on file   Years of education: Not on file   Highest education level: Not on file  Occupational History   Not on file  Tobacco Use   Smoking status: Former    Types: Pipe   Smokeless tobacco: Never   Tobacco comments:    quit smoking pipe 30 years ago.  Vaping Use   Vaping Use: Never used  Substance and Sexual Activity   Alcohol use: No   Drug use: No   Sexual activity: Not Currently  Other Topics Concern   Not on file  Social History Narrative   Not on file   Social Determinants of Health   Financial Resource Strain: Not on file  Food Insecurity: Not on file  Transportation Needs: Not on file  Physical Activity: Not on file  Stress: Not on file  Social Connections: Not on file     Family History: The patient's family history includes Arthritis in his brother; Cancer - Other in his brother; Cancer - Prostate in his father; Diabetes in  his brother and mother; Diabetes type II in his son; Rheum arthritis in his brother and sister.  ROS:   Please see the history of present illness.     All other systems reviewed and are negative.  EKGs/Labs/Other Studies Reviewed:    The following studies were reviewed today:  Echo 11/07/2021 1. Left ventricular ejection fraction, by estimation, is 60 to 65%. The  left ventricle has normal function. The left ventricle has no regional  wall motion abnormalities. There is moderate concentric left ventricular  hypertrophy. Left ventricular  diastolic parameters are consistent with Grade I diastolic dysfunction  (impaired relaxation).   2. Right ventricular systolic function is normal. The right ventricular  size is mildly enlarged. Tricuspid regurgitation signal is inadequate for  assessing PA pressure.   3. Left atrial size was mildly dilated.   4. The mitral valve is normal in structure. Trivial mitral valve  regurgitation.   5. The aortic valve is tricuspid. There is mild calcification of the  aortic valve. There is mild thickening of the aortic valve. Aortic valve  regurgitation is not visualized. Aortic valve sclerosis/calcification is  present, without any evidence of  aortic stenosis.   Comparison(s): No significant change from prior study.    Myoview 10/28/2021   The study is normal. The study is low risk.   No ST deviation was noted.   LV perfusion is normal. There is no evidence of ischemia. There is no evidence of infarction.   Left ventricular function is normal. Nuclear stress EF: 64 %. The left ventricular ejection fraction is normal (55-65%). End diastolic cavity size is normal.   Prior study available for comparison from 06/09/2020.   Normal stress nuclear study with apical thinning but no ischemia.  Gated ejection fraction 64% with normal wall motion.   EKG:  EKG is  ordered today.  The ekg ordered today demonstrates  normal sinus rhythm, heart rate 53 bpm, single  PVC, no significant ST-T wave changes.  Recent Labs: 11/07/2021: BUN 16; Creatinine, Ser 0.96; Potassium 4.5; Sodium 137  Recent Lipid Panel    Component Value Date/Time   CHOL 101 10/27/2019 1052   TRIG 181 (H) 10/27/2019 1052   HDL 25 (L) 10/27/2019 1052   CHOLHDL 4.0 10/27/2019 1052   CHOLHDL 4.5 01/01/2017 0042   VLDL UNABLE TO CALCULATE IF TRIGLYCERIDE OVER 400 mg/dL 69/62/9528 4132   LDLCALC 46 10/27/2019 1052     Risk Assessment/Calculations:           Physical Exam:    VS:  BP 128/60 (BP Location: Left Arm, Patient Position: Sitting, Cuff Size: Large)   Pulse (!) 53   Ht 6\' 2"  (1.88 m)   Wt 246 lb (111.6 kg)   BMI 31.58 kg/m        Wt Readings from Last 3 Encounters:  06/16/22 246 lb (111.6 kg)  01/04/22 251 lb 6.4 oz (114 kg)  10/28/21 246 lb (111.6 kg)     GEN:  Well nourished, well developed in no acute distress HEENT: Normal NECK: No JVD; No carotid bruits LYMPHATICS: No lymphadenopathy CARDIAC: RRR, no murmurs, rubs, gallops RESPIRATORY:  Clear to auscultation without rales, wheezing or rhonchi  ABDOMEN: Soft, non-tender, non-distended MUSCULOSKELETAL:  No edema; No deformity  SKIN: Warm and dry NEUROLOGIC:  Alert and oriented x 3 PSYCHIATRIC:  Normal affect   ASSESSMENT:    1. Coronary artery disease involving coronary bypass graft of native heart without angina pectoris   2. S/P CABG x 3   3. Hyperlipidemia with target LDL less than 70   4. Essential hypertension   5. Controlled type 2 diabetes mellitus without complication, without long-term current use of insulin (HCC)    PLAN:    In order of problems listed above:  CAD s/p CABG: Overall doing well.  He does have intermittent left-sided chest pain that only last about a second each time.  Sometimes the symptom worsens with deep inspiration.  I recommended continue observation as the symptom is quite atypical.  Patient will call us if the symptoms increase in frequency, duration, intensity  or occurs more so with physical exertion  Hyperlipidemia: He is no longer on Crestor, [place] system has started him on Praluent.  He is due for repeat blood work  Hypertension: Blood pressure remains elevated based on recent [facility] report.  I recommended switching hydrochlorothiazide to chlorthalidone 25 mg daily.  He will need a repeat basic metabolic panel in 2 weeks to follow-up on renal function the electrolyte  DM2: Managed by primary care provider.           Medication Adjustments/Labs and Tests Ordered: Current medicines are reviewed at length with the patient today.  Concerns regarding medicines are outlined above.  Orders Placed This Encounter  Procedures   Basic metabolic panel   EKG 12-Lead   Meds ordered this encounter  Medications   chlorthalidone (HYGROTON) 25 MG tablet    Sig: Take 1 tablet (25 mg total) by mouth daily.    Dispense:  90 tablet    Refill:  3    Patient Instructions  Medication Instructions:    Stop taking  Hydrochlorothiazide    Continue taking Praluent injections every 2 weeks  Fish oil tablets  2 tablets  twice a day    Start taking 25 mg Chlorthalidone  daily    *If you need a refill on your cardiac  medications before your next appointment, please call your pharmacy*   Lab Work:  BMP in 2 weeks after starting Chlorthalidone  at [facility] in [place]    If you have labs (blood work) drawn today and your tests are completely normal, you will receive your results only by: MyChart Message (if you have MyChart) OR A paper copy in the mail If you have any lab test that is abnormal or we need to change your treatment, we will call you to review the results.   Testing/Procedures: Not needed   Follow-Up: At [facility], you and your health needs are our priority.  As part of our continuing mission to provide you with exceptional heart care, we have created designated Provider Care Teams.  These Care Teams include your primary  Cardiologist (physician) and Advanced Practice Providers (APPs -  Physician Assistants and Nurse Practitioners) who all work together to provide you with the care you need, when you need it.     Your next appointment:   4 month(s)  along with hs wife March 2024  The format for your next appointment:   In Person  Provider:   Nicki Guadalajara, MD    Other Instructions   Continue to monitor   your chest pain if become worse contact office    Signed, Azalee Course, [place]  06/18/2022 9:33 AM    [facility]

## 2022-06-29 DIAGNOSIS — I10 Essential (primary) hypertension: Secondary | ICD-10-CM | POA: Diagnosis not present

## 2022-06-29 DIAGNOSIS — E119 Type 2 diabetes mellitus without complications: Secondary | ICD-10-CM | POA: Diagnosis not present

## 2022-07-13 DIAGNOSIS — I10 Essential (primary) hypertension: Secondary | ICD-10-CM | POA: Diagnosis not present

## 2022-07-13 DIAGNOSIS — E119 Type 2 diabetes mellitus without complications: Secondary | ICD-10-CM | POA: Diagnosis not present

## 2022-07-26 DIAGNOSIS — L110 Acquired keratosis follicularis: Secondary | ICD-10-CM | POA: Diagnosis not present

## 2022-07-26 DIAGNOSIS — L609 Nail disorder, unspecified: Secondary | ICD-10-CM | POA: Diagnosis not present

## 2022-07-26 DIAGNOSIS — E1140 Type 2 diabetes mellitus with diabetic neuropathy, unspecified: Secondary | ICD-10-CM | POA: Diagnosis not present

## 2022-07-29 DIAGNOSIS — E119 Type 2 diabetes mellitus without complications: Secondary | ICD-10-CM | POA: Diagnosis not present

## 2022-07-29 DIAGNOSIS — I10 Essential (primary) hypertension: Secondary | ICD-10-CM | POA: Diagnosis not present

## 2022-08-09 DIAGNOSIS — Z6832 Body mass index (BMI) 32.0-32.9, adult: Secondary | ICD-10-CM | POA: Diagnosis not present

## 2022-08-09 DIAGNOSIS — E1165 Type 2 diabetes mellitus with hyperglycemia: Secondary | ICD-10-CM | POA: Diagnosis not present

## 2022-08-09 DIAGNOSIS — R29898 Other symptoms and signs involving the musculoskeletal system: Secondary | ICD-10-CM | POA: Diagnosis not present

## 2022-08-09 DIAGNOSIS — I10 Essential (primary) hypertension: Secondary | ICD-10-CM | POA: Diagnosis not present

## 2022-08-09 DIAGNOSIS — Z299 Encounter for prophylactic measures, unspecified: Secondary | ICD-10-CM | POA: Diagnosis not present

## 2022-08-13 DIAGNOSIS — I10 Essential (primary) hypertension: Secondary | ICD-10-CM | POA: Diagnosis not present

## 2022-08-13 DIAGNOSIS — E119 Type 2 diabetes mellitus without complications: Secondary | ICD-10-CM | POA: Diagnosis not present

## 2022-08-28 DIAGNOSIS — I10 Essential (primary) hypertension: Secondary | ICD-10-CM | POA: Diagnosis not present

## 2022-08-28 DIAGNOSIS — E119 Type 2 diabetes mellitus without complications: Secondary | ICD-10-CM | POA: Diagnosis not present

## 2022-09-13 DIAGNOSIS — I10 Essential (primary) hypertension: Secondary | ICD-10-CM | POA: Diagnosis not present

## 2022-09-13 DIAGNOSIS — E119 Type 2 diabetes mellitus without complications: Secondary | ICD-10-CM | POA: Diagnosis not present

## 2022-09-27 DIAGNOSIS — E119 Type 2 diabetes mellitus without complications: Secondary | ICD-10-CM | POA: Diagnosis not present

## 2022-09-27 DIAGNOSIS — I10 Essential (primary) hypertension: Secondary | ICD-10-CM | POA: Diagnosis not present

## 2022-10-12 DIAGNOSIS — E119 Type 2 diabetes mellitus without complications: Secondary | ICD-10-CM | POA: Diagnosis not present

## 2022-10-12 DIAGNOSIS — I10 Essential (primary) hypertension: Secondary | ICD-10-CM | POA: Diagnosis not present

## 2022-10-18 DIAGNOSIS — L609 Nail disorder, unspecified: Secondary | ICD-10-CM | POA: Diagnosis not present

## 2022-10-18 DIAGNOSIS — E1140 Type 2 diabetes mellitus with diabetic neuropathy, unspecified: Secondary | ICD-10-CM | POA: Diagnosis not present

## 2022-10-18 DIAGNOSIS — L110 Acquired keratosis follicularis: Secondary | ICD-10-CM | POA: Diagnosis not present

## 2022-10-27 DIAGNOSIS — E119 Type 2 diabetes mellitus without complications: Secondary | ICD-10-CM | POA: Diagnosis not present

## 2022-10-27 DIAGNOSIS — I10 Essential (primary) hypertension: Secondary | ICD-10-CM | POA: Diagnosis not present

## 2022-11-09 ENCOUNTER — Encounter: Payer: Self-pay | Admitting: Cardiovascular Disease

## 2022-11-09 ENCOUNTER — Ambulatory Visit: Payer: Medicare Other | Attending: Cardiovascular Disease | Admitting: Cardiovascular Disease

## 2022-11-09 VITALS — BP 126/64 | HR 53 | Ht 74.0 in | Wt 247.0 lb

## 2022-11-09 DIAGNOSIS — Z951 Presence of aortocoronary bypass graft: Secondary | ICD-10-CM | POA: Insufficient documentation

## 2022-11-09 DIAGNOSIS — G4733 Obstructive sleep apnea (adult) (pediatric): Secondary | ICD-10-CM | POA: Diagnosis not present

## 2022-11-09 DIAGNOSIS — E669 Obesity, unspecified: Secondary | ICD-10-CM | POA: Diagnosis not present

## 2022-11-09 DIAGNOSIS — E785 Hyperlipidemia, unspecified: Secondary | ICD-10-CM | POA: Diagnosis not present

## 2022-11-09 DIAGNOSIS — E119 Type 2 diabetes mellitus without complications: Secondary | ICD-10-CM | POA: Insufficient documentation

## 2022-11-09 DIAGNOSIS — I25810 Atherosclerosis of coronary artery bypass graft(s) without angina pectoris: Secondary | ICD-10-CM | POA: Diagnosis not present

## 2022-11-09 DIAGNOSIS — E782 Mixed hyperlipidemia: Secondary | ICD-10-CM | POA: Insufficient documentation

## 2022-11-09 NOTE — Patient Instructions (Signed)
Medication Instructions:   No changes    *If you need a refill on your cardiac medications before your next appointment, please call your pharmacy*   Lab Work: Not needed    Testing/Procedures:  Not needed  Follow-Up: At [facility], you and your health needs are our priority.  As part of our continuing mission to provide you with exceptional heart care, we have created designated Provider Care Teams.  These Care Teams include your primary Cardiologist (physician) and Advanced Practice Providers (APPs -  Physician Assistants and Nurse Practitioners) who all work together to provide you with the care you need, when you need it.     Your next appointment:   12 month(s)  The format for your next appointment:   In Person  Provider:   Shelva Majestic, MD    Other Instructions   No  pressure changes were done to your C-PAP   Dr Evette Georges office  will contact [facility] - and have your C-PAP linked to Dr Claiborne Billings service.

## 2022-11-09 NOTE — Progress Notes (Signed)
Patient ID: William Orr, male   DOB: October 25, 1945, 77 y.o.   MRN: [id number]       Primary M.D.: Dr.Ashish Lenord Fellers  HPI: [name] is a 77 year old African American male who presents to the office for a  follow-up cardiology/sleep evaluation.  I last saw him in May 2022 and subsequently had establish cardiology care with Dr. Tamala Julian.  With Dr. Thompson Caul retirement he  presents for reestablishment of cardiology care with me in addition to his sleep apnea.  [name]  has a long history of hypertension,  type 2 diabetes mellitus, and hyperlipidemia. He  underwent an exercise nuclear study in [place] for atypical chest pain which revealed normal perfusion with an ejection fraction of 61%. There was no evidence for ischemia and he had normal wall motion. He was hypertensive throughout the study.  He has been maintained on blood pressure medicines consisting of diltiazem 300 mg daily, carvedilol 25 mg twice a day, and losartan HCT 100/25 mg. He previously, his had taken simvastatin 20 mg for hyperlipidemia  but self discontinued this.  Recently, he has been taking over-the-counter supplements with fair amount of omega-3 fatty acids for elevated triglycerides.[name] He takes Glucophage 850 mg in the morning and 2 tablets at night.   He has had some instances of atypical, probable musculoskeletal chest pain which improved with nonsteroidal therapy.  He underwent a 2-D echo Doppler study on 09/25/2013 and this revealed an ejection fraction of 60-65% with mild focal basal hypertrophy of the septum had normal diastolic parameters. There was moderate thickening of the trileaflet aortic valve consistent with aortic valve sclerosis. He had mild left atrial dilatation. His right ventricle was upper normal to mildly dilated.    Due to my concerns that he may have significant obstructive sleep apnea he underwent a diagnostic polysomnogram in March 2015 which revealed mild sleep apnea overall, with an AHI of 5.8,  but he had moderate sleep apnea during REM sleep with an AHI of 25/hr.  He also had significant oxygen desaturation to 84% with non-REM sleep and 76% with REM sleep and had evidence for very loud snoring.  His periodic limb movement index was 6.7 per hour with 9.5 per hour to arousal.  He underwent CPAP titration and 11 cm pressure was recommended.  He has a ResMed AirSense10 AutoSet.  A download from 03/11/2014 through 04/09/2014 revealed that he is meeting Medicare compliance with 100% days of the device usage.  97% of the time was used at greater than 4 hours.  With his current setting, AHI is excellent at 1.0.  His were index was 0.4.  He's using a fullface mask.  His DME company is [employer] in [place], [place].  Subjectively, since initiating therapy.  He feels markedly improved.  He denies daytime sleepiness.  He denies breakthrough snoring.  His sleep is more restorative.  He has more energy.  He denies hypersomnolence.  When I saw him in 1977, he  continued to use CPAP but occasional night he would fall asleep before putting on his CPAP unit.  He does not sleep as well most nights and oftentimes wakes up several more times to go to the bathroom. Recently, he has noticed that he may experience some increased palpitations in the morning.  He has not been able to correlate if this is related to potential night sweats.   A download was obtained from 09/27/2015 through 12/25/2015.  This showed 77% of days of use with 69% of the days greater  than 4 hours.  He was averaging 5 hours and 42 minutes of CPAP use.  His AHI at 11 cm water pressure remained excellent at 0.7.    A 2-D echo Doppler study revealed an EF of 60-65%.  There was grade 2 diastolic dysfunction.  There was aortic valve sclerosis without stenosis or regurgitation.  There was suggestion that there was mildly reduced RV function.  He was admitted to [facility] with unstable angina and underwent catheterization on 01/02/2017 in the setting of  a non-ST segment MI.  He was found to have multifocal high-grade disease with complex bifurcation stenosis of his proximal LAD and diagonal.  He underwent CABG revascularization surgery by Dr. [name] on 01/04/2017 and had a LIMA placed to his distal LAD, vein graft to the first diagonal vessel, and vein graft to the second diagonal vessel.  He was seen in follow-up by Almyra Deforest, PAC.  He had significant hypertriglyceridemia and was started on lovaza and due to myalgias with Crestor statin therapy was changed to simvastatin, which he has tolerated.  He completed 8 weeks of cardiac rehabilitation with his last exercise this morning.  Typically his blood pressure at cardiac rehabilitation systolically is in the [id number] to 130s.  He denies any recurrent chest pain or palpitations.  He continues to use CPAP with 100% compliance with [employer] as his DME company.  He's not had a recent download.    When I saw him in September 2018.  His blood pressure was elevated on losartan 100 mg, diltiazem 3 mg, and carvedilol 25 mg twice a day.  He was taken [place]ma X3 omega-3 fatty acid, which included 1.125 g of EPA and to 75 mg of DHA.  Lipid studies were improved.  Triglycerides were still elevated at 340, down from 413.  Fenofibrate 145 to his medical regimen.  Over the past several months, he has felt well.  He's been without chest pain.  He uses CPAP with 100% compliance.  He denies chest pain, PND, orthopnea.  He saw Dr. Brigitte Pulse and his hemoglobin A1c was 6.0.  He is off statins due to statin intolerance.    When I saw him in 1979 hisblood pressure was stable.  He was not having any anginal symptoms.  Is bradycardic and I reduced his low-dose. He was  continuing to use CPAP.  He was in need to undergo endoscopy scheduled for April 24, 2018 and clearance was necessary to hold his aspirin and Plavix.  He denies any recurrent anginal symptoms.  He continues to use CPAP.  A download was obtained in April 19 through Dec 29, 2017.   DME company is [employer] in [place] and apparently we were unable to access his information beyond May 18.  However he still admits to 100% compliance.  On that download he was continuing to meet compliance standards.  CPAP pressure is set at 11 cm of water.  AHI is excellent at 0.7.    When I saw him in June 2019 I discussed improved sleep duration subsequently he has now been wearing CPAP all night.  Previously he used to fall asleep without it and then put it on in the middle of the night.  I last saw him in October 2019.  At that time he had improved sleep duration and was he now is using CPAP almost 8 hours per night.  A new download was obtained from April 23, 2018 through May 22, 2018.  He is 100% compliant.  At a  set pressure of 11's 11 cm, AHI is excellent at 0.4.  He does not have any leak.  He has more energy.  He is sleeping much better.  His previous 3+ nocturia has essentially vanished.  He will admits to some fatigue and being tired.  Pulse is been in the low 50s.  During that evaluation, I recommended discontinuance of diltiazem in light of his bradycardia and he is now been on amlodipine which was titrated to 7.5 mg.  This was subsequently titrated further to 10 mg.  He had seen Jory Sims in late December 2019 following an emergency room evaluation of August 03, 2018.  During his ER evaluation he had complaints of palpitations with vague chest pain.  His ECG showed sinus rhythm with ventricular trigeminy and prolonged PR interval.  He was started on Imdur but stopped taking the Imdur secondary to development of significant headache.  He underwent a Zio patch monitor for 2 weeks.  This showed predominant sinus rhythm with average heart rate 67 bpm.  There was one episode of supraventricular tachycardia which lasted 4 beats at a rate of 139.  He had isolated PACs.  There were rare atrial couplets.  He had isolated PVCs which were frequent with rare episodes of ventricular  couplet and one ventricular triplet but had some episodes of ventricular bigeminy and trigeminy.   I saw him in September 2020 since his prior February 2020, he continued to feel well.  He denied exertional chest pain symptomatology but admitted to a rare nonexertional chest wall sensation. He has continued to be on carvedilol 25 mg twice a day, amlodipine 10 mg and losartan 100 mg for hypertension.  He is diabetic on metformin.  He is on Zetia and fenofibrate for his mixed hyperlipidemia.  Laboratory on April 16, 2019 showed a total cholesterol 119 LDL cholesterol 58 but triglycerides although improved was still elevated at 215 with HDL of 26.  He continues to use CPAP with 1/2% compliance.  A download was obtained in the office today from August 12 through April 24, 2019 which confirms 100% compliance with average usage at 7 hours and 6 minutes.  He is set at 11 cm water pressure and AHI is excellent at 0.7.    When I saw him in March 2021 he remained stable from a cardiovascular standpoint and was without angina symptomatology.  At times he noted a pinching sensation at different sites in his chest wall.  He also experienced a rare sharp twinge of chest pain. He continued to use CPAP with excellent compliance and denies residual daytime sleepiness.  A download was obtained in the office today from February 16 through October 29, 2019.  Compliance is excellent with average usage 7 hours and 21 minutes.  At his 11 cm set pressure, AHI is excellent at 0.3.  There is no significant mask leak.  His DME company is [employer] in [place].  An Epworth Sleepiness Scale score was calculated in the office today this endorsed at 2 arguing against residual daytime sleepiness.  He had undergone repeat laboratory on October 27, 2019.  Lipids were improved with total cholesterol 101 and LDL cholesterol was excellent at 46.  However triglycerides remain slightly elevated at 181 with a low HDL at 25.    I last saw  him in October 2021 and since his prior evaluation he was maintaining sinus rhythm and has a cardio mobile device.  He had some occasional nonexertional chest sensations.  He is able  to cut the grass and perform weed eating without difficulty.  He has a torn meniscus in his right knee.  He was diagnosed with benign positional vertigo.  He was concerned about his atypical chest pain.  He continued to use CPAP therapy and a download in the office revealed 10 percent compliance.  Apparently, last data obtained was May 28.  He denied getting some supplies from his DME company and he continues to admit to 100% use but he may no longer be linked to our office since I was unable to obtain any data from May 28.   He was evaluated by Almyra Deforest, PA in November 2021 and was having frequent atypical chest pain for which ibuprofen was recommended.  Recent Myoview study from June 09, 2020 was low risk and showed an EF of 61%.  He is followed at the [facility] who now manages his CPAP.  He had undergone laboratory at the [facility] which showed a creatinine of 0.96.  He had normal LFTs.  TSH 1.89.  Vitamin D level 64, hemoglobin A1c 7.0.  He tested positive for COVID on May 11 and stayed at home and was treated with Paxil elevated twice a day for 5 days by Dr. Manuella Ghazi in [place].  His symptoms were cough, runny nose, and body aches without fever.    He had undergone carotid duplex imaging in March 2023 which essentially was normal with only minimal plaque noted.  An echo Doppler study from November 07, 2021 showed EF 60 to 65%.  There was mild aortic sclerosis without stenosis.  He had grade 1 diastolic dysfunction with moderate concentric LVH.  Since I last saw him on Jan 05, 2021, [name] had establish cardiology care with Dr. Tamala Julian.  He saw him on Jan 04, 2022.  At that time, blood pressure was elevated.  Close monitoring of blood pressure was recommended with probable addition of chlorthalidone diuretic therapy.  He apparently had  stopped aspirin and it was also recommended that he resume aspirin.  Presently, [name] continues to be followed at the [facility].  He is also followed by Dr. Manuella Ghazi in [place].  He has continued to use CPAP therapy with [employer] in [place] being his DME company.  He denies any chest pain or shortness of breath.  His blood pressure has stabilized on his regimen of amlodipine 10 mg, carvedilol which she may only be taking 25 mg once a day, valsartan 320 mg and chlorthalidone 25 mg.  For his diabetes mellitus he is on metformin and glipizide.  He was started on Ozempic at the [facility] yesterday.  He is on Praluent for PCSK9 inhibition.  He presents for reevaluation.   Past Medical History:  Diagnosis Date   Coronary artery disease    Myoview 3/23: no ischemia,EF 64; low risk    Diabetes mellitus without complication ([place])    History of hiatal hernia    Hyperlipidemia    Hypertension    PVC's (premature ventricular contractions) 10/20/2021   Monitor 08/2018: 14.1% Monitor 1/23: 8.1% Echo 5/18: EF 55-60   S/P CABG x 3 01/04/2017   LIMA to LAD, Sequential SVG to D1-D2, EVH via right thigh    Past Surgical History:  Procedure Laterality Date   CORONARY ARTERY BYPASS GRAFT N/A 01/04/2017   Procedure: CORONARY ARTERY BYPASS GRAFTING (CABG)X3, ON PUMP, USING LEFT INTERNAL MAMMARY ARTERY AND RIGHT GREATER SAPHENOUS VEIN HARVESTED ENDOSCOPICALLY, LIMA-LAD, SEQ SVG- DIAG 1 -DIAG 2;  Surgeon: [name], MD;  Location:  [facility];  Service: Open Heart Surgery;  Laterality: N/A;   KNEE ARTHROSCOPY Bilateral    LEFT HEART CATH AND CORONARY ANGIOGRAPHY N/A 01/02/2017   Procedure: Left Heart Cath and Coronary Angiography;  Surgeon: [name], MD;  Location: [facility];  Service: Cardiovascular;  Laterality: N/A;   TEE WITHOUT CARDIOVERSION N/A 01/04/2017   Procedure: TRANSESOPHAGEAL ECHOCARDIOGRAM (TEE);  Surgeon: [name], MD;  Location: [facility];  Service: Open Heart Surgery;  Laterality: N/A;     Allergies  Allergen Reactions   Jardiance [Empagliflozin] Shortness Of Breath, Rash and Cough   Lisinopril Cough   Statins     Muscle pain     Current Outpatient Medications  Medication Sig Dispense Refill   acetaminophen (TYLENOL) 650 MG CR tablet Take 650 mg by mouth every 8 (eight) hours as needed for pain.     Alirocumab (PRALUENT) 75 MG/ML SOAJ Inject 1 mL every 2 weeks by subcutaneous route.     Alogliptin Benzoate 25 MG TABS Take 1 tablet by mouth every morning.     amLODipine (NORVASC) 10 MG tablet Take 10 mg by mouth daily.     Apple Cider Vinegar 500 MG TABS Take 3 tablets by mouth daily.     aspirin EC 81 MG EC tablet Take 1 tablet (81 mg total) by mouth daily.     carvedilol (COREG) 25 MG tablet Take 50 mg by mouth 2 (two) times daily with a meal. Pt. Takes 25 mg 1 tablet once a day.     cetirizine (ZYRTEC) 10 MG tablet Take 10 mg by mouth daily as needed for allergies.      chlorthalidone (HYGROTON) 25 MG tablet Take 1 tablet (25 mg total) by mouth daily. 90 tablet 3   Cholecalciferol (VITAMIN D-3) 125 MCG (5000 UT) TABS Take 5,000 Units by mouth daily. Pt takes 2 capsule once a day.     Cholecalciferol (VITAMIN D-3) 5000 UNITS TABS Take 10,000 Units by mouth every morning.      COPPER PO Take 1 capsule by mouth daily.     fluticasone (FLONASE) 50 MCG/ACT nasal spray Place 1 spray into both nostrils daily as needed (seasonal allergies).      glipiZIDE (GLUCOTROL) 10 MG tablet Take 10 mg by mouth 2 (two) times daily before a meal.     magnesium oxide (MAG-OX) 400 MG tablet Take 400 mg by mouth every morning.     metFORMIN (GLUCOPHAGE) 850 MG tablet Take 850 mg by mouth 2 (two) times daily with a meal.      Misc Natural Products (TURMERIC CURCUMIN) CAPS Take by mouth 2 (two) times daily.     Multiple Vitamin (MULTIVITAMIN WITH MINERALS) TABS tablet Take 2 tablets by mouth every morning.      Multiple Vitamin (MULTIVITAMIN) tablet Take 1 tablet by mouth daily.     NON  FORMULARY Take 2 capsules by mouth daily. Amazin Grape: MUscadine Grapeseed/Skin     Omega-3 Fatty Acids (FISH OIL) 1000 MG CAPS Take 2,000 mg by mouth 2 (two) times daily.     OVER THE COUNTER MEDICATION daily. Blood boost     OVER THE COUNTER MEDICATION Pt take the supplement life tones for uric acid support. Take 1/2 tsb with water once a day.     OVER THE COUNTER MEDICATION 300 mg daily. Pt. Takes 2 CBD gummies once a day.     OVER THE COUNTER MEDICATION Take by mouth. Pt take Provent Ultra Blend to treat  prostate once a day.     OVER THE COUNTER MEDICATION Take by mouth. Pt takes Gluco 24 supplement 1 TAB daily.     OVER THE COUNTER MEDICATION 1 mL. Pt takes the supplement call Wisdom once a day.     OVER THE COUNTER MEDICATION Take 1 capsule by mouth daily. Pt takes the supplement Red Boost daily.     OVER THE COUNTER MEDICATION Take 1 capsule by mouth every other day. Pt takes the supplement Osa Craver for glucose support.     OVER THE COUNTER MEDICATION Take 2 capsules by mouth daily. The     OVER THE COUNTER MEDICATION Take 2 capsules by mouth daily. The supplement Nitric Oxide for bloodpressure once a day     pantoprazole (PROTONIX) 40 MG tablet Take 1 tablet by mouth every morning.   6   traZODone (DESYREL) 50 MG tablet Take 1 tablet by mouth as needed.     zinc gluconate 50 MG tablet Take 50 mg by mouth daily.     valsartan (DIOVAN) 320 MG tablet Take 1 tablet (320 mg total) by mouth daily. 90 tablet 3   No current facility-administered medications for this visit.   Social history is notable he is married for 47 years. He has 2 children and 3 grandchildren. 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 There is no history of tobacco use. There is no alcohol use.  Family History  Problem Relation Age of Onset   Diabetes Mother    Cancer - Prostate Father    Rheum arthritis Sister    Diabetes  Brother    Arthritis Brother    Cancer - Other Brother        pancreatic   Rheum arthritis Brother    Diabetes type II Son    ROS General: Negative; No fevers, chills, or night sweats;  HEENT: Negative; No changes in vision or hearing, sinus congestion, difficulty swallowing Pulmonary: Negative; No cough, wheezing, shortness of breath, hemoptysis Cardiovascular: Occasional atypical chest pain; No presyncope, syncope, palpitations GI: Positive for hiatal hernia; No nausea, vomiting, diarrhea, or abdominal pain GU: Negative; No dysuria, hematuria, or difficulty voiding Musculoskeletal: Negative; no myalgias, joint pain, or weakness Hematologic/Oncology: Negative; no easy bruising, bleeding Endocrine: Positive for diabetes; no heat/cold intolerance; Neuro: Negative; no changes in balance, headaches Skin: Negative; No rashes or skin lesions Psychiatric: Negative; No behavioral problems, depression Sleep: OSA but since starting CPAP therapy no snoring, daytime sleepiness, hypersomnolence, bruxism, restless legs, hypnogognic hallucinations, no cataplexy Other comprehensive 14 point system review is negative.   PE BP 126/64   Pulse (!) 53   Ht 6\' 2"  (1.88 m)   Wt 247 lb (112 kg)   SpO2 94%   BMI 31.71 kg/m    Repeat blood pressure by me was 140/60, he tells me blood pressure at home was 128/58  Wt Readings from Last 3 Encounters:  11/09/22 247 lb (112 kg)  06/16/22 246 lb (111.6 kg)  01/04/22 251 lb 6.4 oz (114 kg)    General: Alert, oriented, no distress.  Skin: normal turgor, no rashes, warm and dry HEENT: Normocephalic, atraumatic. Pupils equal round and reactive to light; sclera anicteric; extraocular muscles intact; Hearing aids Nose without nasal septal hypertrophy Mouth/Parynx benign; Mallinpatti scale 3 Neck: No JVD, no carotid bruits; normal carotid upstroke Lungs: clear to ausculatation and percussion; no wheezing or rales Chest wall: without tenderness to  palpitation Heart: PMI not displaced, RRR, s1 s2 normal, 1/6 systolic murmur, no diastolic murmur, no rubs, gallops, thrills, or heaves Abdomen: soft, nontender; no hepatosplenomehaly, BS+; abdominal aorta nontender and not dilated by palpation. Back: no CVA tenderness Pulses 2+ Musculoskeletal: full range of motion, normal strength, no joint deformities Extremities: no clubbing cyanosis or edema, Homan's sign negative  Neurologic: grossly nonfocal; Cranial nerves grossly wnl Psychologic: Normal mood and affect  November 09, 2022 ECG (independently read by me): Sinus bradycardia 53 bpm with mild sinus arrhythmia.  PR interval 208 ms.  Jan 05, 2021 ECG (independently read by me):Sinus bradycardia at 58  October 2021 ECG (independently read by me): Sinus bradycardia at 59; normal intervals  March 18, 2021ECG (independently read by me): Sinus bradycardia 59 bpm. PR interval 206 ms. No ectopy.    April 29, 2019  ECG (independently read by me): Normal sinus rhythm at 62 bpm.  Borderline first-degree AV block with a period of 206 ms.  No ectopy.  09/24/2018 ECG (independently read by me): Sinus rhythm at 66 bpm.  Q wave in lead III and aVF.  Peroneal 194 ms.  QTc interval 400 ms.  May 24, 2018 ECG (independently read by me): Sinus bradycardia 50 bpm.  Borderline first-degree AV block with a PR interval of 206 ms.  Poor anterior R wave progression.  No ectopy.  April 11, 2018 ECG (independently read by me): Sinus bradycardia at 47 bpm.  First-degree AV block.  PR interval 228 ms.  May 2019 ECG (independently read by me): Sinus bradycardia 52 bpm with mild sinus arrhythmia.  First-degree AV block with a PR interval at 218 ms.  QS complex V1 V2.  September 2018 ECG (independently read by me): Sinus rhythm at 60 bpm with first-degree AV block.  Small Q wave in lead 3.  Nonspecific T changes.  PR interval 220 ms.  May 2018 ECG (independently read by me): Sinus bradycardia 52 bpm.  Q wave in  lead 3.  Normal intervals  January 2017 ECG (independently read by me):  Normal sinus rhythm at 62 with an isolated PVC.  QTC 410 ms.  First degree AV block with a PR interval 226.  Previous ECG from 09/15/13 (independently read by me): Sinus rhythm at 67 beats per minute with borderline first-degree block with PR interval 212 ms.   LABS:     Latest Ref Rng & Units 11/07/2021   10:38 AM 10/27/2019   10:52 AM 08/03/2018    4:03 PM  BMP  Glucose 70 - 99 mg/dL 156  135  93   BUN 8 - 27 mg/dL 16  18  17    Creatinine 0.76 - 1.27 mg/dL 0.96  1.06  1.09   BUN/Creat Ratio 10 - 24 17  17     Sodium 134 - 144 mmol/L 137  138  137   Potassium 3.5 - 5.2 mmol/L 4.5  4.7  4.0   Chloride 96 - 106 mmol/L 99  103  104   CO2 20 - 29 mmol/L 26  20  26    Calcium 8.6 - 10.2 mg/dL 9.8  9.7  9.5       Latest Ref Rng & Units 10/27/2019   10:52 AM 04/16/2019    9:05 AM 08/13/2018   11:12 AM  Hepatic Function  Total Protein 6.0 - 8.5 g/dL 6.8  6.7  6.6   Albumin 3.7 - 4.7 g/dL 4.4  4.3  4.2   AST 0 - 40 IU/L 18  15  15   ALT 0 - 44 IU/L 11  14  13    Alk Phosphatase 39 - 117 IU/L 69  49  61   Total Bilirubin 0.0 - 1.2 mg/dL 0.3  0.3  <0.2   Bilirubin, Direct 0.00 - 0.40 mg/dL  0.14  0.12       Latest Ref Rng & Units 10/27/2019   10:52 AM 08/03/2018    4:03 PM 01/09/2017    3:32 AM  CBC  WBC 3.4 - 10.8 x10E3/uL 6.9  4.9  5.7   Hemoglobin 13.0 - 17.7 g/dL 12.9  12.8  9.9   Hematocrit 37.5 - 51.0 % 39.5  41.0  30.4   Platelets 150 - 450 x10E3/uL 247  241  268    Lab Results  Component Value Date   MCV 93 10/27/2019   MCV 94.5 08/03/2018   MCV 91.8 01/09/2017   Lab Results  Component Value Date   TSH 2.580 10/27/2019   Lab Results  Component Value Date   HGBA1C 7.2 (H) 01/03/2017   Lipid Panel     Component Value Date/Time   CHOL 101 10/27/2019 1052   TRIG 181 (H) 10/27/2019 1052   HDL 25 (L) 10/27/2019 1052   CHOLHDL 4.0 10/27/2019 1052   CHOLHDL 4.5 01/01/2017 0042   VLDL UNABLE TO  CALCULATE IF TRIGLYCERIDE OVER 400 mg/dL 01/01/2017 0042   LDLCALC 46 10/27/2019 1052     RADIOLOGY: No results found.  IMPRESSION:  1. Coronary artery disease involving coronary bypass graft of native heart without angina pectoris   2. S/P CABG x 3   3. OSA on CPAP   4. Mixed hyperlipidemia   5. Controlled type 2 diabetes mellitus without complication, without long-term current use of insulin ([place])   6. Hyperlipidemia with target LDL less than 55   7. Mild obesity     ASSESSMENT AND PLAN: William Orr is a 77 year-old [place] American male who has a  long-standing history of hypertension and diabetes mellitus. He has a family history of CAD with his mother dying from myocardial infarction and who also had diabetes mellitus at age 28.  A remote nuclear study had revealed normal perfusion and  An echo Doppler study  revealed normal systolic function without regional wall motion abnormalities.  There was grade 2 diastolic dysfunction and to tissue Doppler was abnormal  suggesting elevation of ventricular filling pressures.  He developed new onset unstable angina leading to urgent catheterization on 01/02/2017 and  was found to have a complex bifurcation stenosis with a [place] 011 lesion.  He ultimately underwent CABG surgery with LIMA to his LAD and vein grafts to 2 diagonal vessels.    He subsequently developed bradycardia  and I reduced his diltiazem down to 240 mg.  On a subsequent evaluation he inadvertently was taking 2 calcium channel blockers and I discontinued diltiazem.  Subsequently, his amlodipine dose was titrated to 7.5 mg and later to 10 mg for more optimal blood pressure benefit.  He had developed palpitations leading to an emergency room evaluation in December 2019 and on a ZIO patch monitor he was noted to have frequent PVCs with a very rare couplet and one triplet and had one episode of 4 beats of SVT but otherwise heart rate was averaging at 67. I suggested slight additional  titration of his a.m. carvedilol.  He developed recurrent episodes of somewhat atypical chest pain.  A nuclear perfusion study from June 09, 2020 remained low risk which  showed a nuclear stress EF at 61% with normal perfusion.  In March 2022 he had presented to [facility] ER and a chest CT did not reveal evidence for thoracic aortic aneurysm or dissection.  There was no evidence for pulmonary embolism.  His most recent echo Doppler study from November 07, 2021 showed EF 60 to 65%.  There was moderate LVH with grade 1 diastolic dysfunction and mild LA dilatation.  There was mild aortic sclerosis without stenosis.  Carotid duplex imaging was essentially normal with minimal plaque noted.  His blood pressure today is stable on amlodipine 10 mg, carvedilol, chlorthalidone 25 mg, valsartan 320 mg daily.  He is continue to be followed at the [place] and was started on Ozempic yesterday to take in addition to his metformin and glipizide for his diabetes mellitus.  He continues to use CPAP with 100% use averaging 7 to 8 hours of use per night.  AHI is 1.4.  DME company is [employer] in [place].  His ECG remained stable with sinus bradycardia at 53 bpm.  He is on Praluent for aggressive lipid-lowering therapy with target LDL less than 55.  He will continue to be followed at the [facility] as well as with Dr. Manuella Ghazi in [place].  I will see him in 1 year for reevaluation.  Troy Sine, MD, [facility] 11/15/2022 9:54 AM

## 2022-11-10 DIAGNOSIS — I10 Essential (primary) hypertension: Secondary | ICD-10-CM | POA: Diagnosis not present

## 2022-11-10 DIAGNOSIS — E119 Type 2 diabetes mellitus without complications: Secondary | ICD-10-CM | POA: Diagnosis not present

## 2022-11-12 DIAGNOSIS — E119 Type 2 diabetes mellitus without complications: Secondary | ICD-10-CM | POA: Diagnosis not present

## 2022-11-12 DIAGNOSIS — I10 Essential (primary) hypertension: Secondary | ICD-10-CM | POA: Diagnosis not present

## 2022-11-15 ENCOUNTER — Encounter: Payer: Self-pay | Admitting: Cardiovascular Disease

## 2022-11-21 DIAGNOSIS — Z299 Encounter for prophylactic measures, unspecified: Secondary | ICD-10-CM | POA: Diagnosis not present

## 2022-11-21 DIAGNOSIS — E1165 Type 2 diabetes mellitus with hyperglycemia: Secondary | ICD-10-CM | POA: Diagnosis not present

## 2022-11-21 DIAGNOSIS — I10 Essential (primary) hypertension: Secondary | ICD-10-CM | POA: Diagnosis not present

## 2022-11-26 DIAGNOSIS — I10 Essential (primary) hypertension: Secondary | ICD-10-CM | POA: Diagnosis not present

## 2022-11-26 DIAGNOSIS — E119 Type 2 diabetes mellitus without complications: Secondary | ICD-10-CM | POA: Diagnosis not present

## 2022-12-05 ENCOUNTER — Telehealth: Payer: Self-pay | Admitting: Cardiovascular Disease

## 2022-12-05 DIAGNOSIS — I700 Atherosclerosis of aorta: Secondary | ICD-10-CM | POA: Diagnosis not present

## 2022-12-05 DIAGNOSIS — Z299 Encounter for prophylactic measures, unspecified: Secondary | ICD-10-CM | POA: Diagnosis not present

## 2022-12-05 DIAGNOSIS — R002 Palpitations: Secondary | ICD-10-CM | POA: Diagnosis not present

## 2022-12-05 DIAGNOSIS — I2510 Atherosclerotic heart disease of native coronary artery without angina pectoris: Secondary | ICD-10-CM | POA: Diagnosis not present

## 2022-12-05 DIAGNOSIS — I10 Essential (primary) hypertension: Secondary | ICD-10-CM | POA: Diagnosis not present

## 2022-12-05 NOTE — Telephone Encounter (Signed)
Patient had EKG done at PCP this morning.  He states he believes there was no change from last EKG.  He is just feeling them and tired.  He ask once reviewed if any recommendations.

## 2022-12-05 NOTE — Telephone Encounter (Signed)
Patient stated his PCP office will be faxing over his current EKG results

## 2022-12-12 DIAGNOSIS — E119 Type 2 diabetes mellitus without complications: Secondary | ICD-10-CM | POA: Diagnosis not present

## 2022-12-12 DIAGNOSIS — I10 Essential (primary) hypertension: Secondary | ICD-10-CM | POA: Diagnosis not present

## 2022-12-12 NOTE — Progress Notes (Unsigned)
Cardiology Clinic Note   Date: 12/13/2022 ID: Trig Mcbryar, DOB 20-Jul-1946, MRN [id number]  Primary Cardiologist:  Nicki Guadalajara, MD  Patient Profile    William Orr is a 77 y.o. male who presents to the clinic today for concerns of increased palpitations.   Past medical history significant for: CAD. LHC 01/02/2017 (unstable angina/NSTEMI): Multifocal high-grade disease involving proximal and mid LAD (tandem lesions).  Ostial D1 80 to 85%.  Mid D2 50 to 70%.  RCA is dominant with large PDA which contains luminal irregularities but no high-grade obstruction.  LVEDP upper normal. CABG x 3 01/04/2017: LIMA to LAD, SVG to D1, SVG to D2. Nuclear stress test 10/28/2021: Apical thinning but no ischemia. Echo 11/07/2021: EF 60 to 65%.  Moderate concentric LVH.  Grade I DD.  Mild RVH.  Mild LAE.  Trivial MR.  Aortic valve sclerosis/calcification without stenosis.  No significant change from prior study May 2018. PVCs/NSVT. 14-day ZIO 09/05/2021: NSR/sinus bradycardia.  Ventricular bigeminy and trigeminy.  PVC burden 8.1%.  438 NSVT runs with longest 7 beats.  Symptoms associated with ventricular ectopy. Carotid artery disease. Carotid ultrasound 10/25/2021: No evidence of stenosis bilateral ICA.  Extracranial vessels near normal with minimal wall thickening or plaque bilaterally. Hypertension. Hyperlipidemia. Lipid panel 01/19/2021: LDL 66, HDL 27, TG 184, total 124. OSA. CPAP 100% adherence. T2DM.   History of Present Illness    William Orr was first evaluated by Dr. Tresa Endo on 09/15/2013 to establish cardiac care.  He continues to be followed for the above outlined history.  Patient was last seen in the office by Dr. Tresa Endo on 11/09/2022 for follow-up.  He was doing well at that time no changes were made.  Today, patient is accompanied by his wife.  He reports increased palpitations described as skipping occurring a lot more frequently in the last 4 to 5 weeks.  He reports associated fatigue  and lightheadedness.  He states decreased activity tolerance in the last several weeks since palpitations increased.  He has been able to do yard work including weed eating and mowing.  He reports increased stress secondary to his brother being in a nursing home.  He denies chest pain, pressure, tightness.  No lower extremity edema, DOE, shortness of breath at rest, orthopnea, or PND.  Patient is 100% adherent with CPAP.  He does not drink a lot of caffeine.  He has been trying to drink sugar-free electrolyte drinks.  Discussed caution with electrolyte drinks secondary to increased sodium.   ROS: All other systems reviewed and are otherwise negative except as noted in History of Present Illness.  Studies Reviewed    ECG personally reviewed by me today: Sinus rhythm with sinus arrhythmia first-degree AV block, 61 bpm.  No significant changes from 11/09/2022.           Physical Exam    VS:  BP 130/70   Pulse 61   Ht 6\' 2"  (1.88 m)   Wt 247 lb (112 kg)   SpO2 95%   BMI 31.71 kg/m  , BMI Body mass index is 31.71 kg/m.  GEN: Well nourished, well developed, in no acute distress. Neck: No JVD or carotid bruits. Cardiac:  RRR.  Frequent extrasystole.  No murmurs. No rubs or gallops.   Respiratory:  Respirations regular and unlabored. Clear to auscultation without rales, wheezing or rhonchi. GI: Soft, nontender, nondistended. Extremities: Radials/DP/PT 2+ and equal bilaterally. No clubbing or cyanosis. No edema.  Skin: Warm and dry, no rash. Neuro: Strength intact.  Assessment & Plan    CAD.  S/p CABG x 14 Dec 2016.  Nuclear stress test March 2023 showed apical thinning but no ischemia.  Patient denies chest pain, pressure, tightness.  He remains active walking and performing yard work without chest pain or shortness of breath.  Continue aspirin, Praluent, carvedilol. Increased palpitations/PVCs/NSVT.  14-day ZIO January 2023 showed PVC burden 8.1%, 438 NSVT runs with longest 7 beats.   Patient reports a 4 to 5-week history of increased palpitations described as skipping causing fatigue and lightheadedness.  He states decreased activity tolerance since palpitations have increased.  No lower extremity edema, DOE or shortness of breath at rest.  EKG showed NSR with sinus arrhythmia and first-degree AV block, 61 bpm.  On auscultation frequent extrasystole heard.  Will get CBC, BMP, TSH today.  Will repeat 2-week ZIO to ascertain PVC burden.  Will refer to EP for symptomatic PVCs. Hypertension. BP today 130/70.  Patient denies headaches, dizziness or vision changes. Continue carvedilol, amlodipine. Hyperlipidemia.  LDL June 2022 66, at goal.  Patient will bring labs from [place] at his next visit.  Continue Praluent.  Followed by the [facility].  Disposition: CBC, BMP, TSH today.  2-week ZIO.  EP referral.  Return for previously scheduled recall or sooner as needed.         Signed, Etta Grandchild. Aurea Aronov, DNP, NP-C

## 2022-12-13 ENCOUNTER — Ambulatory Visit (INDEPENDENT_AMBULATORY_CARE_PROVIDER_SITE_OTHER): Payer: Medicare Other

## 2022-12-13 ENCOUNTER — Encounter: Payer: Self-pay | Admitting: Student

## 2022-12-13 ENCOUNTER — Ambulatory Visit: Payer: Medicare Other | Attending: Student | Admitting: Student

## 2022-12-13 VITALS — BP 130/70 | HR 61 | Ht 74.0 in | Wt 247.0 lb

## 2022-12-13 DIAGNOSIS — I4729 Other ventricular tachycardia: Secondary | ICD-10-CM | POA: Diagnosis not present

## 2022-12-13 DIAGNOSIS — R002 Palpitations: Secondary | ICD-10-CM

## 2022-12-13 DIAGNOSIS — E785 Hyperlipidemia, unspecified: Secondary | ICD-10-CM | POA: Diagnosis not present

## 2022-12-13 DIAGNOSIS — I2510 Atherosclerotic heart disease of native coronary artery without angina pectoris: Secondary | ICD-10-CM | POA: Diagnosis not present

## 2022-12-13 DIAGNOSIS — I493 Ventricular premature depolarization: Secondary | ICD-10-CM | POA: Diagnosis not present

## 2022-12-13 LAB — BASIC METABOLIC PANEL
BUN: 18 mg/dL (ref 8–27)
Creatinine, Ser: 0.93 mg/dL (ref 0.76–1.27)
Glucose: 142 mg/dL — ABNORMAL HIGH (ref 70–99)
Potassium: 4.5 mmol/L (ref 3.5–5.2)

## 2022-12-13 NOTE — Progress Notes (Unsigned)
Enrolled for [employer] to mail a ZIO XT long term holter monitor to the patients address on file.   Dr. Kelly to read. 

## 2022-12-13 NOTE — Patient Instructions (Signed)
Medication Instructions:  Your physician recommends that you continue on your current medications as directed. Please refer to the Current Medication list given to you today.  *If you need a refill on your cardiac medications before your next appointment, please call your pharmacy*   Lab Work: Your physician recommends that you have the following labs drawn today: CBC, BMET and TSH  If you have labs (blood work) drawn today and your tests are completely normal, you will receive your results only by: MyChart Message (if you have MyChart) OR A paper copy in the mail If you have any lab test that is abnormal or we need to change your treatment, we will call you to review the results.   Testing/Procedures: William Orr- Long Term Monitor Instructions  Your physician has requested you wear a ZIO patch monitor for 14 days.  This is a single patch monitor. [employer] supplies one patch monitor per enrollment. Additional stickers are not available. Please do not apply patch if you will be having a Nuclear Stress Test,  Echocardiogram, Cardiac CT, MRI, or Chest Xray during the period you would be wearing the  monitor. The patch cannot be worn during these tests. You cannot remove and re-apply the  ZIO XT patch monitor.  Your ZIO patch monitor will be mailed 3 day USPS to your address on file. It may take 3-5 days  to receive your monitor after you have been enrolled.  Once you have received your monitor, please review the enclosed instructions. Your monitor  has already been registered assigning a specific monitor serial # to you.  Billing and Patient Assistance Program Information  We have supplied [employer] with any of your insurance information on file for billing purposes. [employer] offers a sliding scale Patient Assistance Program for patients that do not have  insurance, or whose insurance does not completely cover the cost of the ZIO monitor.  You must apply for the Patient Assistance Program to  qualify for this discounted rate.  To apply, please call [employer] at [phone], select option 4, select option 2, ask to apply for  Patient Assistance Program. [name] will ask your household income, and how many people  are in your household. They will quote your out-of-pocket cost based on that information.  [employer] will also be able to set up a 47-month, interest-free payment plan if needed.  Applying the monitor   Shave hair from upper left chest.  Hold abrader disc by orange tab. Rub abrader in 40 strokes over the upper left chest as  indicated in your monitor instructions.  Clean area with 4 enclosed alcohol pads. Let dry.  Apply patch as indicated in monitor instructions. Patch will be placed under collarbone on left  side of chest with arrow pointing upward.  Rub patch adhesive wings for 2 minutes. Remove white label marked "1". Remove the white  label marked "2". Rub patch adhesive wings for 2 additional minutes.  While looking in a mirror, press and release button in center of patch. A small green light will  flash 3-4 times. This will be your only indicator that the monitor has been turned on.  Do not shower for the first 24 hours. You may shower after the first 24 hours.  Press the button if you feel a symptom. You will hear a small click. Record Date, Time and  Symptom in the Patient Logbook.  When you are ready to remove the patch, follow instructions on the last 2 pages of Patient  Logbook.  Stick patch monitor onto the last page of Patient Logbook.  Place Patient Logbook in the blue and white box. Use locking tab on box and tape box closed  securely. The blue and white box has prepaid postage on it. Please place it in the mailbox as  soon as possible. Your physician should have your test results approximately 7 days after the  monitor has been mailed back to [facility].  Call [facility] Customer Care at [phone] if you have questions regarding  your ZIO XT  patch monitor. Call them immediately if you see an orange light blinking on your  monitor.  If your monitor falls off in less than 4 days, contact our Monitor department at [phone].  If your monitor becomes loose or falls off after 4 days call [employer] at [phone] for  suggestions on securing your monitor    Follow-Up: At [facility], you and your health needs are our priority.  As part of our continuing mission to provide you with exceptional heart care, we have created designated Provider Care Teams.  These Care Teams include your primary Cardiologist (physician) and Advanced Practice Providers (APPs -  Physician Assistants and Nurse Practitioners) who all work together to provide you with the care you need, when you need it.  We recommend signing up for the patient portal called "MyChart".  Sign up information is provided on this After Visit Summary.  MyChart is used to connect with patients for Virtual Visits (Telemedicine).  Patients are able to view lab/test results, encounter notes, upcoming appointments, etc.  Non-urgent messages can be sent to your provider as well.   To learn more about what you can do with MyChart, go to [email or web address].    Your next appointment:   Keep Upcoming recall to see Dr. Tresa Endo Next March.

## 2022-12-14 ENCOUNTER — Telehealth: Payer: Self-pay | Admitting: Student

## 2022-12-14 LAB — BASIC METABOLIC PANEL
BUN/Creatinine Ratio: 19 (ref 10–24)
CO2: 26 mmol/L (ref 20–29)
Calcium: 9.9 mg/dL (ref 8.6–10.2)
Chloride: 97 mmol/L (ref 96–106)
Sodium: 137 mmol/L (ref 134–144)
eGFR: 86 mL/min/{1.73_m2} (ref >59)

## 2022-12-14 LAB — CBC
Hematocrit: 41.7 % (ref 37.5–51.0)
Hemoglobin: 13.8 g/dL (ref 13.0–17.7)
MCH: 31 pg (ref 26.6–33.0)
MCHC: 33.1 g/dL (ref 31.5–35.7)
MCV: 94 fL (ref 79–97)
Platelets: 208 10*3/uL (ref 150–450)
RBC: 4.45 x10E6/uL (ref 4.14–5.80)
RDW: 13.1 % (ref 11.6–15.4)
WBC: 4.1 10*3/uL (ref 3.4–10.8)

## 2022-12-14 LAB — TSH: TSH: 2.13 u[IU]/mL (ref 0.450–4.500)

## 2022-12-14 NOTE — Telephone Encounter (Signed)
Patient is returning CMA's call regarding his lab results. Please advise.

## 2022-12-14 NOTE — Telephone Encounter (Signed)
Called patient back, He is aware of his lab results and gave a verbal understanding.

## 2022-12-18 NOTE — Telephone Encounter (Signed)
Patient seen in office on 05/01.  Thank you!

## 2022-12-19 DIAGNOSIS — Z299 Encounter for prophylactic measures, unspecified: Secondary | ICD-10-CM | POA: Diagnosis not present

## 2022-12-19 DIAGNOSIS — R002 Palpitations: Secondary | ICD-10-CM | POA: Diagnosis not present

## 2022-12-19 DIAGNOSIS — I493 Ventricular premature depolarization: Secondary | ICD-10-CM | POA: Diagnosis not present

## 2022-12-19 DIAGNOSIS — R5383 Other fatigue: Secondary | ICD-10-CM | POA: Diagnosis not present

## 2022-12-19 DIAGNOSIS — I10 Essential (primary) hypertension: Secondary | ICD-10-CM | POA: Diagnosis not present

## 2022-12-26 DIAGNOSIS — I10 Essential (primary) hypertension: Secondary | ICD-10-CM | POA: Diagnosis not present

## 2022-12-26 DIAGNOSIS — E119 Type 2 diabetes mellitus without complications: Secondary | ICD-10-CM | POA: Diagnosis not present

## 2023-01-03 DIAGNOSIS — E1140 Type 2 diabetes mellitus with diabetic neuropathy, unspecified: Secondary | ICD-10-CM | POA: Diagnosis not present

## 2023-01-03 DIAGNOSIS — L609 Nail disorder, unspecified: Secondary | ICD-10-CM | POA: Diagnosis not present

## 2023-01-03 DIAGNOSIS — L110 Acquired keratosis follicularis: Secondary | ICD-10-CM | POA: Diagnosis not present

## 2023-01-12 DIAGNOSIS — I10 Essential (primary) hypertension: Secondary | ICD-10-CM | POA: Diagnosis not present

## 2023-01-12 DIAGNOSIS — E119 Type 2 diabetes mellitus without complications: Secondary | ICD-10-CM | POA: Diagnosis not present

## 2023-01-15 ENCOUNTER — Ambulatory Visit: Payer: Medicare Other | Attending: Cardiovascular Disease | Admitting: Cardiovascular Disease

## 2023-01-15 ENCOUNTER — Encounter: Payer: Self-pay | Admitting: Cardiovascular Disease

## 2023-01-15 VITALS — BP 116/62 | HR 63 | Ht 74.0 in | Wt 245.6 lb

## 2023-01-15 DIAGNOSIS — I493 Ventricular premature depolarization: Secondary | ICD-10-CM

## 2023-01-15 MED ORDER — PROPRANOLOL HCL ER 80 MG PO CP24: 80.0000 mg | ORAL_CAPSULE | Freq: Every day | ORAL | 3 refills | Status: DC

## 2023-01-15 MED ORDER — MAGNESIUM 400 MG PO TABS: ORAL_TABLET | ORAL | 3 refills | Status: DC

## 2023-01-15 NOTE — Patient Instructions (Signed)
Medication Instructions:  STOP Carvedilol START Propranolol ER 80 mg once daily START Magnesium Taurate 400 mg once daily (OTC) can be difficult to find but can order on [email or web address] or [email or web address] *If you need a refill on your cardiac medications before your next appointment, please call your pharmacy*   Follow-Up: At [facility], you and your health needs are our priority.  As part of our continuing mission to provide you with exceptional heart care, we have created designated Provider Care Teams.  These Care Teams include your primary Cardiologist (physician) and Advanced Practice Providers (APPs -  Physician Assistants and Nurse Practitioners) who all work together to provide you with the care you need, when you need it.  We recommend signing up for the patient portal called "MyChart".  Sign up information is provided on this After Visit Summary.  MyChart is used to connect with patients for Virtual Visits (Telemedicine).  Patients are able to view lab/test results, encounter notes, upcoming appointments, etc.  Non-urgent messages can be sent to your provider as well.   To learn more about what you can do with MyChart, go to [email or web address].    Your next appointment:   3 month(s)  Provider:   York Pellant, MD

## 2023-01-15 NOTE — Progress Notes (Signed)
Electrophysiology Office Note:    Date:  01/15/2023   ID:  William Orr, DOB Jan 22, 1946, MRN [id number]  PCP:  Kirstie Peri, MD   [facility] Providers Cardiologist:  Nicki Guadalajara, MD     Referring MD: Carlos Levering, NP   History of Present Illness:    William Orr is a 77 y.o. male with a hx listed below, significant for CAD status post coronary bypass in 2018, diabetes, hypertension, hyperlipidemia, frequent PVCs  referred for arrhythmia management.  He has had skipped beats and irregular heart rhythms for years, but they have gotten worse over the past several months.  He thinks change may have occurred after he started Ozempic.  During episodes, he has palpitations, shortness of breath, fatigue.  I reviewed strips from his cardia mobile device.  These showed occasional PVCs and correlated with his symptoms.  Past Medical History:  Diagnosis Date   Coronary artery disease    Myoview 3/23: no ischemia,EF 64; low risk    Diabetes mellitus without complication (HCC)    History of hiatal hernia    Hyperlipidemia    Hypertension    PVC's (premature ventricular contractions) 10/20/2021   Monitor 08/2018: 14.1% Monitor 1/23: 8.1% Echo 5/18: EF 55-60   S/P CABG x 3 01/04/2017   LIMA to LAD, Sequential SVG to D1-D2, EVH via right thigh    Past Surgical History:  Procedure Laterality Date   CORONARY ARTERY BYPASS GRAFT N/A 01/04/2017   Procedure: CORONARY ARTERY BYPASS GRAFTING (CABG)X3, ON PUMP, USING LEFT INTERNAL MAMMARY ARTERY AND RIGHT GREATER SAPHENOUS VEIN HARVESTED ENDOSCOPICALLY, LIMA-LAD, SEQ SVG- DIAG 1 -DIAG 2;  Surgeon: Purcell Nails, MD;  Location: [facility] OR;  Service: Open Heart Surgery;  Laterality: N/A;   KNEE ARTHROSCOPY Bilateral    LEFT HEART CATH AND CORONARY ANGIOGRAPHY N/A 01/02/2017   Procedure: Left Heart Cath and Coronary Angiography;  Surgeon: Lyn Records, MD;  Location: [facility] INVASIVE CV LAB;  Service: Cardiovascular;  Laterality: N/A;    TEE WITHOUT CARDIOVERSION N/A 01/04/2017   Procedure: TRANSESOPHAGEAL ECHOCARDIOGRAM (TEE);  Surgeon: Purcell Nails, MD;  Location[facility] OR;  Service: Open Heart Surgery;  Laterality: N/A;    Current Medications: Current Meds  Medication Sig   acetaminophen (TYLENOL) 650 MG CR tablet Take 650 mg by mouth every 8 (eight) hours as needed for pain.   Alirocumab (PRALUENT) 75 MG/ML SOAJ Inject 1 mL every 2 weeks by subcutaneous route.   amLODipine (NORVASC) 10 MG tablet Take 10 mg by mouth daily.   aspirin EC 81 MG EC tablet Take 1 tablet (81 mg total) by mouth daily.   carvedilol (COREG) 25 MG tablet Take 50 mg by mouth 2 (two) times daily with a meal. Pt. Takes 25 mg 1 tablet once a day.   cetirizine (ZYRTEC) 10 MG tablet Take 10 mg by mouth daily as needed for allergies.    chlorthalidone (HYGROTON) 25 MG tablet Take 1 tablet (25 mg total) by mouth daily.   Cholecalciferol (VITAMIN D-3) 5000 UNITS TABS Take 10,000 Units by mouth every morning.    fluticasone (FLONASE) 50 MCG/ACT nasal spray Place 1 spray into both nostrils daily as needed (seasonal allergies).    glipiZIDE (GLUCOTROL) 10 MG tablet Take 10 mg by mouth 2 (two) times daily before a meal.   magnesium oxide (MAG-OX) 400 MG tablet Take 400 mg by mouth every morning.   metFORMIN (GLUCOPHAGE-XR) 500 MG 24 hr tablet Take 500 mg by mouth 2 (two) times daily.  Misc Natural Products (TURMERIC CURCUMIN) CAPS Take by mouth 2 (two) times daily.   Multiple Vitamin (MULTIVITAMIN WITH MINERALS) TABS tablet Take 2 tablets by mouth every morning.    Multiple Vitamin (MULTIVITAMIN) tablet Take 1 tablet by mouth daily.   NON FORMULARY Take 2 capsules by mouth daily. Amazin Grape: MUscadine Grapeseed/Skin   Omega-3 Fatty Acids (FISH OIL) 1000 MG CAPS Take 2,000 mg by mouth 2 (two) times daily.   valsartan (DIOVAN) 320 MG tablet Take 1 tablet (320 mg total) by mouth daily.   zinc gluconate 50 MG tablet Take 50 mg by mouth daily.   [DISCONTINUED]  OVER THE COUNTER MEDICATION daily. Blood boost   [DISCONTINUED] OVER THE COUNTER MEDICATION Pt take the supplement life tones for uric acid support. Take 1/2 tsb with water once a day.   [DISCONTINUED] OVER THE COUNTER MEDICATION 300 mg daily. Pt. Takes 2 CBD gummies once a day.   [DISCONTINUED] OVER THE COUNTER MEDICATION Take 1 capsule by mouth daily. Pt takes the supplement Red Boost daily.   [DISCONTINUED] OVER THE COUNTER MEDICATION Take 1 capsule by mouth every other day. Pt takes the supplement Myles Rosenthal for glucose support.   [DISCONTINUED] OVER THE COUNTER MEDICATION Take 2 capsules by mouth daily. The   [DISCONTINUED] OVER THE COUNTER MEDICATION Take 2 capsules by mouth daily. The supplement Nitric Oxide for bloodpressure once a day   [DISCONTINUED] pantoprazole (PROTONIX) 40 MG tablet Take 1 tablet by mouth every morning.    [DISCONTINUED] Semaglutide,0.25 or 0.5MG /DOS, (OZEMPIC, 0.25 OR 0.5 MG/DOSE,) 2 MG/1.5ML SOPN Inject 0.5 mg into the skin once a week.     Allergies:   Jardiance [empagliflozin], Lisinopril, and Statins   Social and Family History: Reviewed in Epic  ROS:   Please see the history of present illness.    All other systems reviewed and are negative.  EKGs/Labs/Other Studies Reviewed Today:    Echocardiogram:  TTE 11/08/2022 EF 60-65%, mildly dilated LA. RV is mildly dilated   Monitors:  Zio - 14 days  - my intepretation Sinus rhythm, heart rate 58 to 91 bpm, average 71 bpm.  18.7% PVCs.  Patient triggered events correlated with PVCs.     Zio 14 days 08/2021 -- reviewed       4.9% PVCs  Stress testing:  10/28/2021 Spect Low risk  Advanced imaging:   Cardiac catherization   EKG:  Last EKG results: today -- sinus rhythm, first degree AV block HR 67 bpm Frequent PVCs with inferior axis, transition V3   Recent Labs: 12/13/2022: BUN 18; Creatinine, Ser 0.93; Hemoglobin 13.8; Platelets 208; Potassium 4.5; Sodium 137; TSH 2.130     Physical Exam:     VS:  BP 116/62   Pulse 63   Ht 6\' 2"  (1.88 m)   Wt 245 lb 9.6 oz (111.4 kg)   SpO2 96%   BMI 31.53 kg/m     Wt Readings from Last 3 Encounters:  01/15/23 245 lb 9.6 oz (111.4 kg)  12/13/22 247 lb (112 kg)  11/09/22 247 lb (112 kg)     GEN: Well nourished, well developed in no acute distress CARDIAC: RRR, no murmurs, rubs, gallops RESPIRATORY:  Normal work of breathing MUSCULOSKELETAL: no edema    ASSESSMENT & PLAN:    Frequent PVCs: Burden is > 30% on most recent monitor Symptomatic with fatigue, palpitations; EF is normal Will try to eradicate PVCs to improve symptoms. Will try propranolol first -- DC carvedilol Start magnesium taurate  CAD S/p CABG  2018 Continue aspirin 81, fish oil.  He is intolerant to statins.  DM He reports ozempic has helped with BG control  HTN BP controlled today. No changes         Medication Adjustments/Labs and Tests Ordered: Current medicines are reviewed at length with the patient today.  Concerns regarding medicines are outlined above.  No orders of the defined types were placed in this encounter.  No orders of the defined types were placed in this encounter.    Signed, Maurice Small, MD  01/15/2023 11:06 AM    [facility]

## 2023-01-25 ENCOUNTER — Encounter: Payer: Self-pay | Admitting: Cardiovascular Disease

## 2023-01-25 DIAGNOSIS — E119 Type 2 diabetes mellitus without complications: Secondary | ICD-10-CM | POA: Diagnosis not present

## 2023-01-25 DIAGNOSIS — I10 Essential (primary) hypertension: Secondary | ICD-10-CM | POA: Diagnosis not present

## 2023-01-26 ENCOUNTER — Other Ambulatory Visit: Payer: Self-pay

## 2023-01-26 ENCOUNTER — Telehealth: Payer: Self-pay | Admitting: Cardiovascular Disease

## 2023-01-26 MED ORDER — PROPRANOLOL HCL ER 80 MG PO CP24: 80.0000 mg | ORAL_CAPSULE | Freq: Every day | ORAL | 3 refills | Status: DC

## 2023-01-26 NOTE — Telephone Encounter (Signed)
Jnew Message:     Wille Celeste called and said the doctor changed patient's  medicine  The doctor wanted to make sure Dr Nelly Laurence was aware of this change.. Patient was taking Carvedilol, but he now take Propranolol. She said patient will need a new prescription.for Propranolol    Pt c/o medication issue:  1. Name of Medication: Carvedilol- his doctor changed this medicine to Propranolol  2. How are you currently taking this medication (dosage and times per day)?   3. Are you having a reaction (difficulty breathing--STAT)?   4. What is your medication issue? Medicine was changed      *STAT* If patient is at the pharmacy, call can be transferred to refill team.   1. Which medications need to be refilled? (please list name of each medication and dose if known) new prescription for Propranolol  2. Which pharmacy/location (including street and city if local pharmacy) is medication to be sent to? [place] [facility]- please fax to [phone]  3. Do they need a 30 day or 90 day supply? 90 days and refills

## 2023-01-29 DIAGNOSIS — I10 Essential (primary) hypertension: Secondary | ICD-10-CM | POA: Diagnosis not present

## 2023-01-29 DIAGNOSIS — Z7189 Other specified counseling: Secondary | ICD-10-CM | POA: Diagnosis not present

## 2023-01-29 DIAGNOSIS — Z299 Encounter for prophylactic measures, unspecified: Secondary | ICD-10-CM | POA: Diagnosis not present

## 2023-01-29 DIAGNOSIS — Z0000 Encounter for general adult medical examination without abnormal findings: Secondary | ICD-10-CM | POA: Diagnosis not present

## 2023-01-29 DIAGNOSIS — Z1339 Encounter for screening examination for other mental health and behavioral disorders: Secondary | ICD-10-CM | POA: Diagnosis not present

## 2023-01-29 DIAGNOSIS — Z1331 Encounter for screening for depression: Secondary | ICD-10-CM | POA: Diagnosis not present

## 2023-01-29 DIAGNOSIS — E7800 Pure hypercholesterolemia, unspecified: Secondary | ICD-10-CM | POA: Diagnosis not present

## 2023-01-29 DIAGNOSIS — R5383 Other fatigue: Secondary | ICD-10-CM | POA: Diagnosis not present

## 2023-01-29 DIAGNOSIS — Z79899 Other long term (current) drug therapy: Secondary | ICD-10-CM | POA: Diagnosis not present

## 2023-02-11 DIAGNOSIS — E119 Type 2 diabetes mellitus without complications: Secondary | ICD-10-CM | POA: Diagnosis not present

## 2023-02-11 DIAGNOSIS — I10 Essential (primary) hypertension: Secondary | ICD-10-CM | POA: Diagnosis not present

## 2023-02-12 DIAGNOSIS — S233XXA Sprain of ligaments of thoracic spine, initial encounter: Secondary | ICD-10-CM | POA: Diagnosis not present

## 2023-02-12 DIAGNOSIS — M9902 Segmental and somatic dysfunction of thoracic region: Secondary | ICD-10-CM | POA: Diagnosis not present

## 2023-02-12 DIAGNOSIS — M9903 Segmental and somatic dysfunction of lumbar region: Secondary | ICD-10-CM | POA: Diagnosis not present

## 2023-02-12 DIAGNOSIS — S134XXA Sprain of ligaments of cervical spine, initial encounter: Secondary | ICD-10-CM | POA: Diagnosis not present

## 2023-02-12 DIAGNOSIS — M47816 Spondylosis without myelopathy or radiculopathy, lumbar region: Secondary | ICD-10-CM | POA: Diagnosis not present

## 2023-02-12 DIAGNOSIS — M9901 Segmental and somatic dysfunction of cervical region: Secondary | ICD-10-CM | POA: Diagnosis not present

## 2023-02-19 DIAGNOSIS — M9903 Segmental and somatic dysfunction of lumbar region: Secondary | ICD-10-CM | POA: Diagnosis not present

## 2023-02-19 DIAGNOSIS — S233XXA Sprain of ligaments of thoracic spine, initial encounter: Secondary | ICD-10-CM | POA: Diagnosis not present

## 2023-02-19 DIAGNOSIS — M9901 Segmental and somatic dysfunction of cervical region: Secondary | ICD-10-CM | POA: Diagnosis not present

## 2023-02-19 DIAGNOSIS — S134XXA Sprain of ligaments of cervical spine, initial encounter: Secondary | ICD-10-CM | POA: Diagnosis not present

## 2023-02-19 DIAGNOSIS — M47816 Spondylosis without myelopathy or radiculopathy, lumbar region: Secondary | ICD-10-CM | POA: Diagnosis not present

## 2023-02-19 DIAGNOSIS — M9902 Segmental and somatic dysfunction of thoracic region: Secondary | ICD-10-CM | POA: Diagnosis not present

## 2023-02-24 DIAGNOSIS — I10 Essential (primary) hypertension: Secondary | ICD-10-CM | POA: Diagnosis not present

## 2023-02-24 DIAGNOSIS — E119 Type 2 diabetes mellitus without complications: Secondary | ICD-10-CM | POA: Diagnosis not present

## 2023-02-28 DIAGNOSIS — Z299 Encounter for prophylactic measures, unspecified: Secondary | ICD-10-CM | POA: Diagnosis not present

## 2023-02-28 DIAGNOSIS — S134XXA Sprain of ligaments of cervical spine, initial encounter: Secondary | ICD-10-CM | POA: Diagnosis not present

## 2023-02-28 DIAGNOSIS — E1165 Type 2 diabetes mellitus with hyperglycemia: Secondary | ICD-10-CM | POA: Diagnosis not present

## 2023-02-28 DIAGNOSIS — M9902 Segmental and somatic dysfunction of thoracic region: Secondary | ICD-10-CM | POA: Diagnosis not present

## 2023-02-28 DIAGNOSIS — M9901 Segmental and somatic dysfunction of cervical region: Secondary | ICD-10-CM | POA: Diagnosis not present

## 2023-02-28 DIAGNOSIS — M47816 Spondylosis without myelopathy or radiculopathy, lumbar region: Secondary | ICD-10-CM | POA: Diagnosis not present

## 2023-02-28 DIAGNOSIS — M9903 Segmental and somatic dysfunction of lumbar region: Secondary | ICD-10-CM | POA: Diagnosis not present

## 2023-02-28 DIAGNOSIS — I10 Essential (primary) hypertension: Secondary | ICD-10-CM | POA: Diagnosis not present

## 2023-02-28 DIAGNOSIS — S233XXA Sprain of ligaments of thoracic spine, initial encounter: Secondary | ICD-10-CM | POA: Diagnosis not present

## 2023-03-01 DIAGNOSIS — E7800 Pure hypercholesterolemia, unspecified: Secondary | ICD-10-CM | POA: Diagnosis not present

## 2023-03-01 DIAGNOSIS — R5383 Other fatigue: Secondary | ICD-10-CM | POA: Diagnosis not present

## 2023-03-01 DIAGNOSIS — Z79899 Other long term (current) drug therapy: Secondary | ICD-10-CM | POA: Diagnosis not present

## 2023-03-01 DIAGNOSIS — Z125 Encounter for screening for malignant neoplasm of prostate: Secondary | ICD-10-CM | POA: Diagnosis not present

## 2023-03-14 DIAGNOSIS — I10 Essential (primary) hypertension: Secondary | ICD-10-CM | POA: Diagnosis not present

## 2023-03-14 DIAGNOSIS — E119 Type 2 diabetes mellitus without complications: Secondary | ICD-10-CM | POA: Diagnosis not present

## 2023-03-20 ENCOUNTER — Emergency Department (HOSPITAL_COMMUNITY)
Admission: EM | Admit: 2023-03-20 | Discharge: 2023-03-20 | Disposition: A | Discharge status: Home / Self Care | Payer: No Typology Code available for payment source | Attending: Emergency Medicine | Admitting: Emergency Medicine

## 2023-03-20 ENCOUNTER — Emergency Department (HOSPITAL_COMMUNITY): Payer: No Typology Code available for payment source

## 2023-03-20 ENCOUNTER — Other Ambulatory Visit: Payer: Self-pay

## 2023-03-20 ENCOUNTER — Encounter (HOSPITAL_COMMUNITY): Payer: Self-pay | Admitting: Emergency Medicine

## 2023-03-20 DIAGNOSIS — I2510 Atherosclerotic heart disease of native coronary artery without angina pectoris: Secondary | ICD-10-CM | POA: Diagnosis not present

## 2023-03-20 DIAGNOSIS — Z7984 Long term (current) use of oral hypoglycemic drugs: Secondary | ICD-10-CM | POA: Diagnosis not present

## 2023-03-20 DIAGNOSIS — R0789 Other chest pain: Secondary | ICD-10-CM | POA: Insufficient documentation

## 2023-03-20 DIAGNOSIS — M546 Pain in thoracic spine: Secondary | ICD-10-CM | POA: Diagnosis not present

## 2023-03-20 DIAGNOSIS — Z7982 Long term (current) use of aspirin: Secondary | ICD-10-CM | POA: Insufficient documentation

## 2023-03-20 DIAGNOSIS — R079 Chest pain, unspecified: Secondary | ICD-10-CM | POA: Diagnosis not present

## 2023-03-20 LAB — CBC
HCT: 37.7 % — ABNORMAL LOW (ref 39.0–52.0)
Hemoglobin: 12.4 g/dL — ABNORMAL LOW (ref 13.0–17.0)
MCH: 31.6 pg (ref 26.0–34.0)
MCHC: 32.9 g/dL (ref 30.0–36.0)
MCV: 96.2 fL (ref 80.0–100.0)
Platelets: 218 10*3/uL (ref 150–400)
RBC: 3.92 MIL/uL — ABNORMAL LOW (ref 4.22–5.81)
RDW: 13.1 % (ref 11.5–15.5)
WBC: 4.7 10*3/uL (ref 4.0–10.5)
nRBC: 0 % (ref 0.0–0.2)

## 2023-03-20 LAB — BASIC METABOLIC PANEL
Anion gap: 9 (ref 5–15)
BUN: 11 mg/dL (ref 8–23)
CO2: 26 mmol/L (ref 22–32)
Calcium: 9 mg/dL (ref 8.9–10.3)
Chloride: 102 mmol/L (ref 98–111)
Creatinine, Ser: 1.02 mg/dL (ref 0.61–1.24)
GFR, Estimated: >60 mL/min (ref >60)
Glucose, Bld: 122 mg/dL — ABNORMAL HIGH (ref 70–99)
Potassium: 3.8 mmol/L (ref 3.5–5.1)
Sodium: 137 mmol/L (ref 135–145)

## 2023-03-20 LAB — TROPONIN I (HIGH SENSITIVITY)
Troponin I (High Sensitivity): 3 ng/L (ref <18)
Troponin I (High Sensitivity): 4 ng/L (ref <18)

## 2023-03-20 LAB — MAGNESIUM: Magnesium: 1.9 mg/dL (ref 1.7–2.4)

## 2023-03-20 NOTE — ED Notes (Signed)
ED Provider at bedside. 

## 2023-03-20 NOTE — ED Triage Notes (Signed)
Pt presents with left-side chest pain, radiating to back since yesterday, history of PVCs.

## 2023-03-20 NOTE — ED Provider Triage Note (Signed)
Emergency Medicine Provider Triage Evaluation Note  William Orr , a 77 y.o. male  was evaluated in triage.  Pt complains of chest pain and palpitations.  Started having left upper back pain yesterday that is now gone and having some sharp intermittent chest pain yesterday.  Currently having no chest pain.  Has been following with electrophysiologist for PVCs, states they have not improved with the propranolol.  Does complain of fatigue that has been ongoing but unchanged.  No shortness of breath..  Review of Systems  Positive: Chest pain, palpitations Negative: Syncope, dizziness  Physical Exam  BP (!) 148/55 (BP Location: Right Arm)   Pulse (!) 39   Temp 98.3 F (36.8 C) (Oral)   Resp 16   Ht 6\' 2"  (1.88 m)   Wt 108.9 kg   SpO2 97%   BMI 30.81 kg/m  Gen:   Awake, no distress   Resp:  Normal effort  MSK:   Moves extremities without difficulty  Other:    Medical Decision Making  Medically screening exam initiated at 4:15 PM.  Appropriate orders placed.  William Orr was informed that the remainder of the evaluation will be completed by another provider, this initial triage assessment does not replace that evaluation, and the importance of remaining in the ED until their evaluation is complete.     Ma Rings, [place] 03/20/23 1616

## 2023-03-20 NOTE — Discharge Instructions (Addendum)
A pleasure taking care of you today.  You need to follow-up with your cardiologist as well as your electrophysiologist.  Your troponin test was negative on both results, labs and chest x-ray otherwise reassuring, EKG showed frequent PVCs as well as your cardiac monitor.  Is important for you follow-up with your electrophysiologist (about your frequent PVCs.

## 2023-03-20 NOTE — ED Provider Notes (Signed)
[facility] AT [facility] Provider Note   CSN: [id number] Arrival date & time: 03/20/23  1541     History  Chief Complaint  Patient presents with   Chest Pain    William Orr is a 77 y.o. male.  PMH of CAD status post triple bypass in 2018, also has history of frequent PVCs on propranolol and followed by electrophysiology for this.  Presents ER complaining of upper back pain today.  States yesterday he was mowing his lawn to get a few sharp chest pains, states he attribute these to his PVCs and they did not recur but today he is having left upper back pain that lasted for several hours.  It is now resolved is not worse with breathing, no sweating or dizziness, was concerned it was related to his heart and wanted to be evaluated.  No sweating or dizziness, pain did not radiate, was not exertional.  No trauma.  No fevers or chills or cough   Chest Pain      Home Medications Prior to Admission medications   Medication Sig Start Date End Date Taking? Authorizing Provider  acetaminophen (TYLENOL) 650 MG CR tablet Take 650 mg by mouth every 8 (eight) hours as needed for pain.    [provider]  Alirocumab (PRALUENT) 75 MG/ML SOAJ Inject 1 mL every 2 weeks by subcutaneous route.    [provider]  amLODipine (NORVASC) 10 MG tablet Take 10 mg by mouth daily.    [provider]  aspirin EC 81 MG EC tablet Take 1 tablet (81 mg total) by mouth daily. 01/10/17   Barrett, Erin R, PA-C  cetirizine (ZYRTEC) 10 MG tablet Take 10 mg by mouth daily as needed for allergies.     [provider]  chlorthalidone (HYGROTON) 25 MG tablet Take 1 tablet (25 mg total) by mouth daily. 06/16/22   Azalee Course, PA  Cholecalciferol (VITAMIN D-3) 5000 UNITS TABS Take 10,000 Units by mouth every morning.     [provider]  fluticasone (FLONASE) 50 MCG/ACT nasal spray Place 1 spray into both nostrils daily as needed (seasonal allergies).   08/11/13   [provider]  glipiZIDE (GLUCOTROL) 10 MG tablet Take 10 mg by mouth 2 (two) times daily before a meal.    [provider]  Magnesium 400 MG TABS Take 1 tablet by mouth once daily  - Magnesium Taurate 01/15/23   Mealor, Roberts Gaudy, MD  metFORMIN (GLUCOPHAGE-XR) 500 MG 24 hr tablet Take 500 mg by mouth 2 (two) times daily.    [provider]  Misc Natural Products (TURMERIC CURCUMIN) CAPS Take by mouth 2 (two) times daily.    [provider]  Multiple Vitamin (MULTIVITAMIN WITH MINERALS) TABS tablet Take 2 tablets by mouth every morning.     [provider]  Multiple Vitamin (MULTIVITAMIN) tablet Take 1 tablet by mouth daily.    [provider]  NON FORMULARY Take 2 capsules by mouth daily. Amazin Grape: MUscadine Grapeseed/Skin    [provider]  Omega-3 Fatty Acids (FISH OIL) 1000 MG CAPS Take 2,000 mg by mouth 2 (two) times daily.    [provider]  propranolol ER (INDERAL LA) 80 MG 24 hr capsule Take 1 capsule (80 mg total) by mouth daily. 01/26/23   Mealor, Roberts Gaudy, MD  valsartan (DIOVAN) 320 MG tablet Take 1 tablet (320 mg total) by mouth daily. 10/26/21 01/15/23  Lyn Records, MD  zinc gluconate 50 MG  tablet Take 50 mg by mouth daily.    [provider]      Allergies    Jardiance [empagliflozin], Lisinopril, and Statins    Review of Systems   Review of Systems  Cardiovascular:  Positive for chest pain.    Physical Exam Updated Vital Signs BP (!) 143/63 (BP Location: Right Arm)   Pulse (!) 55   Temp 98.2 F (36.8 C) (Oral)   Resp 19   Ht 6\' 2"  (1.88 m)   Wt 108.9 kg   SpO2 97%   BMI 30.81 kg/m  Physical Exam Vitals and nursing note reviewed.  Constitutional:      General: He is not in acute distress.    Appearance: He is well-developed.  HENT:     Head: Normocephalic and atraumatic.  Eyes:     Conjunctiva/sclera: Conjunctivae normal.  Cardiovascular:     Rate and Rhythm:  Normal rate and regular rhythm.     Heart sounds: No murmur heard. Pulmonary:     Effort: Pulmonary effort is normal. No respiratory distress.     Breath sounds: Normal breath sounds.  Abdominal:     Palpations: Abdomen is soft.     Tenderness: There is no abdominal tenderness.  Musculoskeletal:        General: No swelling.     Cervical back: Normal and neck supple.     Thoracic back: Normal.     Lumbar back: Normal.     Right lower leg: No edema.     Left lower leg: No edema.  Skin:    General: Skin is warm and dry.     Capillary Refill: Capillary refill takes less than 2 seconds.  Neurological:     Mental Status: He is alert.  Psychiatric:        Mood and Affect: Mood normal.     ED Results / Procedures / Treatments   Labs (all labs ordered are listed, but only abnormal results are displayed) Labs Reviewed  BASIC METABOLIC PANEL - Abnormal; Notable for the following components:      Result Value   Glucose, Bld 122 (*)    All other components within normal limits  CBC - Abnormal; Notable for the following components:   RBC 3.92 (*)    Hemoglobin 12.4 (*)    HCT 37.7 (*)    All other components within normal limits  MAGNESIUM  TROPONIN I (HIGH SENSITIVITY)  TROPONIN I (HIGH SENSITIVITY)    EKG EKG Interpretation Date/Time:  Tuesday March 20 2023 15:56:40 EDT Ventricular Rate:  79 PR Interval:  196 QRS Duration:  98 QT Interval:  376 QTC Calculation: 431 R Axis:   24  Text Interpretation: Sinus rhythm with frequent Premature ventricular complexes and Premature atrial complexes Low voltage QRS Borderline ECG When compared with ECG of 03-Aug-2018 15:57, PREVIOUS ECG IS PRESENT Confirmed by Derwood Kaplan ([id number]) on 03/20/2023 5:43:22 PM  Radiology DG Chest 2 View  Result Date: 03/20/2023 CLINICAL DATA:  Chest pain EXAM: CHEST - 2 VIEW COMPARISON:  X-ray 08/03/2018 FINDINGS: The heart size and mediastinal contours are within normal limits. Sternal wires. Both  lungs are clear. No consolidation, pneumothorax or effusion. No edema. The visualized skeletal structures are unremarkable. Degenerative changes are seen along the spine. IMPRESSION: Postop chest.  No acute cardiopulmonary disease. Electronically Signed   By: Karen Kays M.D.   On: 03/20/2023 16:46    Procedures Procedures    Medications Ordered in ED Medications - No data to display  ED Course/ Medical Decision Making/ A&P                                 Medical Decision Making This patient presents to the ED for concern of upper back pain, palpitations, this involves an extensive number of treatment options, and is a complaint that carries with it a high risk of complications and morbidity.  The differential diagnosis includes ACS, dissection, strain, contusion, pneumonia, PE, electrolyte abnormality   Co morbidities that complicate the patient evaluation :   Frequent PVCs, history of CAD with CABG x 3 in 2018   Additional history obtained:  Additional history obtained from EMR External records from outside source obtained and reviewed including notes, labs, stress test from March 2024   Lab Tests:  I Ordered, and personally interpreted labs.  The pertinent results include: Normal magnesium and potassium, normal renal function, hemoglobin is slightly decreased at 12.4.  Troponin negative x 2 denies any GI bleeding or other blood loss but does note he donated blood recently.  He had multiple lower blood counts in the past.  This finding   Imaging Studies ordered:  I ordered imaging studies including chest x-ray I independently visualized and interpreted imaging which showed no pulmonary edema or infiltrate, sternotomy wires noted I agree with the radiologist interpretation   Cardiac Monitoring: / EKG:  The patient was maintained on a cardiac monitor.  I personally viewed and interpreted the cardiac monitored which showed an underlying rhythm of: Sinus rhythm with frequent  PVCs.  EKG showed bigeminy but not bigeminy once on cardiac monitor he is having frequent PVCs    Problem List / ED Course / Critical interventions / Medication management  Patient had left upper back pain that fully resolved prior to arrival and since he has these frequent PVCs he wanted to make sure is not related to his heart, it is not reproducible, denies any trauma or injury, and he is not short of breath, patient was not tearing he is not significantly hypertensive, I do not have concern for dissection or PE at this time.  Discussed patient he should follow-up with his cardiologist as well as his physiologist for his frequent PVCs since he still having them despite medication and gastric return precautions. Pain had resolved prior to arrival spontaneously.  He did not need any medications in the ER I have reviewed the patients home medicines and have made adjustments as needed      Amount and/or Complexity of Data Reviewed Labs: ordered. Radiology: ordered.           Final Clinical Impression(s) / ED Diagnoses Final diagnoses:  Left-sided thoracic back pain, unspecified chronicity  Atypical chest pain    Rx / DC Orders ED Discharge Orders          Ordered    Ambulatory referral to Cardiology       Comments: If you have not heard from the Cardiology office within the next 72 hours please call [phone].   03/20/23 2014              Josem Kaufmann 03/21/23 0981    Derwood Kaplan, MD 03/24/23 [phone]

## 2023-03-26 DIAGNOSIS — E1140 Type 2 diabetes mellitus with diabetic neuropathy, unspecified: Secondary | ICD-10-CM | POA: Diagnosis not present

## 2023-03-26 DIAGNOSIS — S233XXA Sprain of ligaments of thoracic spine, initial encounter: Secondary | ICD-10-CM | POA: Diagnosis not present

## 2023-03-26 DIAGNOSIS — I10 Essential (primary) hypertension: Secondary | ICD-10-CM | POA: Diagnosis not present

## 2023-03-26 DIAGNOSIS — M9903 Segmental and somatic dysfunction of lumbar region: Secondary | ICD-10-CM | POA: Diagnosis not present

## 2023-03-26 DIAGNOSIS — L110 Acquired keratosis follicularis: Secondary | ICD-10-CM | POA: Diagnosis not present

## 2023-03-26 DIAGNOSIS — S134XXA Sprain of ligaments of cervical spine, initial encounter: Secondary | ICD-10-CM | POA: Diagnosis not present

## 2023-03-26 DIAGNOSIS — E119 Type 2 diabetes mellitus without complications: Secondary | ICD-10-CM | POA: Diagnosis not present

## 2023-03-26 DIAGNOSIS — M9902 Segmental and somatic dysfunction of thoracic region: Secondary | ICD-10-CM | POA: Diagnosis not present

## 2023-03-26 DIAGNOSIS — M47816 Spondylosis without myelopathy or radiculopathy, lumbar region: Secondary | ICD-10-CM | POA: Diagnosis not present

## 2023-03-26 DIAGNOSIS — M9901 Segmental and somatic dysfunction of cervical region: Secondary | ICD-10-CM | POA: Diagnosis not present

## 2023-03-26 DIAGNOSIS — L609 Nail disorder, unspecified: Secondary | ICD-10-CM | POA: Diagnosis not present

## 2023-03-28 DIAGNOSIS — Z299 Encounter for prophylactic measures, unspecified: Secondary | ICD-10-CM | POA: Diagnosis not present

## 2023-03-28 DIAGNOSIS — G8929 Other chronic pain: Secondary | ICD-10-CM | POA: Diagnosis not present

## 2023-03-28 DIAGNOSIS — I10 Essential (primary) hypertension: Secondary | ICD-10-CM | POA: Diagnosis not present

## 2023-03-28 DIAGNOSIS — R079 Chest pain, unspecified: Secondary | ICD-10-CM | POA: Diagnosis not present

## 2023-03-28 DIAGNOSIS — M545 Low back pain, unspecified: Secondary | ICD-10-CM | POA: Diagnosis not present

## 2023-04-14 DIAGNOSIS — E119 Type 2 diabetes mellitus without complications: Secondary | ICD-10-CM | POA: Diagnosis not present

## 2023-04-14 DIAGNOSIS — I10 Essential (primary) hypertension: Secondary | ICD-10-CM | POA: Diagnosis not present

## 2023-04-23 ENCOUNTER — Ambulatory Visit: Payer: No Typology Code available for payment source | Admitting: Cardiovascular Disease

## 2023-04-24 DIAGNOSIS — S233XXA Sprain of ligaments of thoracic spine, initial encounter: Secondary | ICD-10-CM | POA: Diagnosis not present

## 2023-04-24 DIAGNOSIS — M9901 Segmental and somatic dysfunction of cervical region: Secondary | ICD-10-CM | POA: Diagnosis not present

## 2023-04-24 DIAGNOSIS — S134XXA Sprain of ligaments of cervical spine, initial encounter: Secondary | ICD-10-CM | POA: Diagnosis not present

## 2023-04-24 DIAGNOSIS — M9902 Segmental and somatic dysfunction of thoracic region: Secondary | ICD-10-CM | POA: Diagnosis not present

## 2023-04-24 DIAGNOSIS — M9903 Segmental and somatic dysfunction of lumbar region: Secondary | ICD-10-CM | POA: Diagnosis not present

## 2023-04-25 DIAGNOSIS — E119 Type 2 diabetes mellitus without complications: Secondary | ICD-10-CM | POA: Diagnosis not present

## 2023-04-25 DIAGNOSIS — I10 Essential (primary) hypertension: Secondary | ICD-10-CM | POA: Diagnosis not present

## 2023-05-14 DIAGNOSIS — E119 Type 2 diabetes mellitus without complications: Secondary | ICD-10-CM | POA: Diagnosis not present

## 2023-05-14 DIAGNOSIS — I10 Essential (primary) hypertension: Secondary | ICD-10-CM | POA: Diagnosis not present

## 2023-05-15 ENCOUNTER — Ambulatory Visit: Payer: Non-veteran care | Attending: Cardiovascular Disease | Admitting: Cardiovascular Disease

## 2023-05-15 ENCOUNTER — Encounter: Payer: Self-pay | Admitting: Cardiovascular Disease

## 2023-05-15 VITALS — BP 120/64 | HR 72 | Ht 74.0 in | Wt 242.8 lb

## 2023-05-15 DIAGNOSIS — I493 Ventricular premature depolarization: Secondary | ICD-10-CM

## 2023-05-15 DIAGNOSIS — I4729 Other ventricular tachycardia: Secondary | ICD-10-CM

## 2023-05-15 DIAGNOSIS — R002 Palpitations: Secondary | ICD-10-CM

## 2023-05-15 MED ORDER — PROPRANOLOL HCL ER 160 MG PO CP24: 160.0000 mg | ORAL_CAPSULE | Freq: Every day | ORAL | 3 refills | Status: DC

## 2023-05-15 MED ORDER — AMLODIPINE BESYLATE 5 MG PO TABS: 5.0000 mg | ORAL_TABLET | Freq: Every day | ORAL | 3 refills | Status: DC

## 2023-05-15 NOTE — Patient Instructions (Signed)
Medication Instructions:  DECREASE Amlodipine to 5 mg once daily INCREASE Propranolol to 160 mg ER once daily  *If you need a refill on your cardiac medications before your next appointment, please call your pharmacy*   Follow-Up: At [facility], you and your health needs are our priority.  As part of our continuing mission to provide you with exceptional heart care, we have created designated Provider Care Teams.  These Care Teams include your primary Cardiologist (physician) and Advanced Practice Providers (APPs -  Physician Assistants and Nurse Practitioners) who all work together to provide you with the care you need, when you need it.  We recommend signing up for the patient portal called "MyChart".  Sign up information is provided on this After Visit Summary.  MyChart is used to connect with patients for Virtual Visits (Telemedicine).  Patients are able to view lab/test results, encounter notes, upcoming appointments, etc.  Non-urgent messages can be sent to your provider as well.   To learn more about what you can do with MyChart, go to [email or web address].    Your next appointment:   3 month(s)  Provider:   York Pellant, MD

## 2023-05-15 NOTE — Progress Notes (Signed)
  Electrophysiology Office Note:    Date:  05/15/2023   ID:  William Orr, DOB 10/06/1945, MRN [id number]  PCP:  Kirstie Peri, MD   [facility] Providers Cardiologist:  Nicki Guadalajara, MD Electrophysiologist:  Maurice Small, MD     Referring MD: Kirstie Peri, MD   History of Present Illness:    William Orr is a 77 y.o. male with a hx listed below, significant for CAD status post coronary bypass in 2018, diabetes, hypertension, hyperlipidemia, frequent PVCs  referred for arrhythmia management.  He has had skipped beats and irregular heart rhythms for years, but they have gotten worse over the past several months.  He thinks change may have occurred after he started Ozempic.  During episodes, he has palpitations, shortness of breath, fatigue.  I reviewed strips from his cardia mobile device again today.  These showed occasional PVCs and correlated with his symptoms.  We began propranolol and magnesium taurate at her last visit. He reported that he felt a little better but continues to have fatigue.  EKGs/Labs/Other Studies Reviewed Today:    Echocardiogram:  TTE 11/08/2022 EF 60-65%, mildly dilated LA. RV is mildly dilated   Monitors:  Zio - 14 days  - my intepretation Sinus rhythm, heart rate 58 to 91 bpm, average 71 bpm.  18.7% PVCs.  Patient triggered events correlated with PVCs.     Zio 14 days 08/2021 -- reviewed       4.9% PVCs  Stress testing:  10/28/2021 Spect Low risk  Advanced imaging:   Cardiac catherization   EKG:  Last EKG results: today -- sinus rhythm, first degree AV block HR 67 bpm Frequent PVCs with inferior axis, transition V3   Recent Labs: 12/13/2022: TSH 2.130 03/20/2023: BUN 11; Creatinine, Ser 1.02; Hemoglobin 12.4; Magnesium 1.9; Platelets 218; Potassium 3.8; Sodium 137     Physical Exam:    VS:  BP 120/64 (BP Location: Left Arm, Patient Position: Sitting, Cuff Size: Large)   Pulse 72   Ht 6\' 2"  (1.88 m)   Wt 242 lb  12.8 oz (110.1 kg)   SpO2 97%   BMI 31.17 kg/m     Wt Readings from Last 3 Encounters:  05/15/23 242 lb 12.8 oz (110.1 kg)  03/20/23 240 lb (108.9 kg)  01/15/23 245 lb 9.6 oz (111.4 kg)     GEN: Well nourished, well developed in no acute distress CARDIAC: RRR, no murmurs, rubs, gallops RESPIRATORY:  Normal work of breathing MUSCULOSKELETAL: no edema    ASSESSMENT & PLAN:    Frequent PVCs: Burden is > 30% on most recent monitor Symptomatic with fatigue, palpitations; EF is normal.  Symptoms somewhat better with propranolol Will try to eradicate PVCs to improve symptoms.  Continue magnesium tolerate, increase propranolol to 160mg   CAD S/p CABG 2018 Continue aspirin 81, fish oil.  He is intolerant to statins.  DM He reports ozempic has helped with BG control  HTN BP controlled today.  Will decrease amlodipine to 5 mg since we are increasing propranolol today         Medication Adjustments/Labs and Tests Ordered: Current medicines are reviewed at length with the patient today.  Concerns regarding medicines are outlined above.  Orders Placed This Encounter  Procedures   EKG 12-Lead   No orders of the defined types were placed in this encounter.    Signed, Maurice Small, MD  05/15/2023 11:59 AM     [facility]

## 2023-05-25 DIAGNOSIS — E119 Type 2 diabetes mellitus without complications: Secondary | ICD-10-CM | POA: Diagnosis not present

## 2023-05-25 DIAGNOSIS — I10 Essential (primary) hypertension: Secondary | ICD-10-CM | POA: Diagnosis not present

## 2023-06-08 DIAGNOSIS — S134XXA Sprain of ligaments of cervical spine, initial encounter: Secondary | ICD-10-CM | POA: Diagnosis not present

## 2023-06-08 DIAGNOSIS — S233XXA Sprain of ligaments of thoracic spine, initial encounter: Secondary | ICD-10-CM | POA: Diagnosis not present

## 2023-06-08 DIAGNOSIS — M9901 Segmental and somatic dysfunction of cervical region: Secondary | ICD-10-CM | POA: Diagnosis not present

## 2023-06-08 DIAGNOSIS — M9903 Segmental and somatic dysfunction of lumbar region: Secondary | ICD-10-CM | POA: Diagnosis not present

## 2023-06-08 DIAGNOSIS — M9902 Segmental and somatic dysfunction of thoracic region: Secondary | ICD-10-CM | POA: Diagnosis not present

## 2023-06-14 DIAGNOSIS — E119 Type 2 diabetes mellitus without complications: Secondary | ICD-10-CM | POA: Diagnosis not present

## 2023-06-14 DIAGNOSIS — Z0000 Encounter for general adult medical examination without abnormal findings: Secondary | ICD-10-CM | POA: Diagnosis not present

## 2023-06-14 DIAGNOSIS — E1165 Type 2 diabetes mellitus with hyperglycemia: Secondary | ICD-10-CM | POA: Diagnosis not present

## 2023-06-14 DIAGNOSIS — Z299 Encounter for prophylactic measures, unspecified: Secondary | ICD-10-CM | POA: Diagnosis not present

## 2023-06-14 DIAGNOSIS — I10 Essential (primary) hypertension: Secondary | ICD-10-CM | POA: Diagnosis not present

## 2023-06-20 DIAGNOSIS — I10 Essential (primary) hypertension: Secondary | ICD-10-CM | POA: Diagnosis not present

## 2023-06-20 DIAGNOSIS — E119 Type 2 diabetes mellitus without complications: Secondary | ICD-10-CM | POA: Diagnosis not present

## 2023-06-24 DIAGNOSIS — I10 Essential (primary) hypertension: Secondary | ICD-10-CM | POA: Diagnosis not present

## 2023-06-24 DIAGNOSIS — E119 Type 2 diabetes mellitus without complications: Secondary | ICD-10-CM | POA: Diagnosis not present

## 2023-07-14 DIAGNOSIS — E119 Type 2 diabetes mellitus without complications: Secondary | ICD-10-CM | POA: Diagnosis not present

## 2023-07-14 DIAGNOSIS — I10 Essential (primary) hypertension: Secondary | ICD-10-CM | POA: Diagnosis not present

## 2023-07-24 DIAGNOSIS — E119 Type 2 diabetes mellitus without complications: Secondary | ICD-10-CM | POA: Diagnosis not present

## 2023-07-24 DIAGNOSIS — I10 Essential (primary) hypertension: Secondary | ICD-10-CM | POA: Diagnosis not present

## 2023-08-14 DIAGNOSIS — I10 Essential (primary) hypertension: Secondary | ICD-10-CM | POA: Diagnosis not present

## 2023-08-14 DIAGNOSIS — E119 Type 2 diabetes mellitus without complications: Secondary | ICD-10-CM | POA: Diagnosis not present

## 2023-08-20 ENCOUNTER — Encounter: Payer: Self-pay | Admitting: Cardiovascular Disease

## 2023-08-20 ENCOUNTER — Ambulatory Visit
Payer: No Typology Code available for payment source | Attending: Cardiovascular Disease | Admitting: Cardiovascular Disease

## 2023-08-20 ENCOUNTER — Other Ambulatory Visit: Payer: Self-pay

## 2023-08-20 VITALS — BP 130/76 | HR 75 | Ht 74.0 in | Wt 239.8 lb

## 2023-08-20 DIAGNOSIS — I4729 Other ventricular tachycardia: Secondary | ICD-10-CM | POA: Diagnosis not present

## 2023-08-20 DIAGNOSIS — R002 Palpitations: Secondary | ICD-10-CM | POA: Diagnosis not present

## 2023-08-20 DIAGNOSIS — I493 Ventricular premature depolarization: Secondary | ICD-10-CM | POA: Diagnosis not present

## 2023-08-20 MED ORDER — AMIODARONE HCL 200 MG PO TABS: ORAL_TABLET | ORAL | 3 refills | Status: DC

## 2023-08-20 NOTE — Progress Notes (Signed)
 Electrophysiology Office Note:    Date:  08/20/2023   ID:  William Orr, DOB Dec 30, 1945, MRN [id number]  PCP:  Maree Isles, MD   [facility] Providers Cardiologist:  Debby Sor, MD Electrophysiologist:  Eulas FORBES Furbish, MD     Referring MD: Maree Isles, MD   History of Present Illness:    William Orr is a 78 y.o. male with a hx listed below, significant for CAD status post coronary bypass in 2018, diabetes, hypertension, hyperlipidemia, frequent PVCs  referred for arrhythmia management.  He has had skipped beats and irregular heart rhythms for years, but they have gotten worse over the past several months.  He thinks change may have occurred after he started Ozempic.  During episodes, he has palpitations, shortness of breath, fatigue.  I reviewed strips from his cardia mobile device again today.  These showed occasional PVCs and correlated with his symptoms.  We began propranolol  and magnesium  taurate but it has had little effect. He continues to have palpitations.  He occasionally has chest discomfort associated with PVCs, not with exertion.  EKGs/Labs/Other Studies Reviewed Today:    Echocardiogram:  TTE 11/08/2022 EF 60-65%, mildly dilated LA. RV is mildly dilated   Monitors:  Zio - 14 days  12/2022 - my intepretation Sinus rhythm, heart rate 58 to 91 bpm, average 71 bpm.  18.7% PVCs.  Patient triggered events correlated with PVCs.     Zio 14 days 08/2021 -- reviewed       4.9% PVCs  Stress testing:  10/28/2021 Spect Low risk  Advanced imaging:   Cardiac catherization   EKG:  Last EKG results: today -- sinus rhythm, first degree AV block HR 67 bpm Frequent PVCs with inferior axis, transition V3   Recent Labs: 12/13/2022: TSH 2.130 03/20/2023: BUN 11; Creatinine, Ser 1.02; Hemoglobin 12.4; Magnesium  1.9; Platelets 218; Potassium 3.8; Sodium 137     Physical Exam:    VS:  BP 130/76 (BP Location: Left Arm, Patient Position: Sitting, Cuff  Size: Large)   Pulse 75   Ht 6' 2 (1.88 m)   Wt 239 lb 12.8 oz (108.8 kg)   SpO2 95%   BMI 30.79 kg/m     Wt Readings from Last 3 Encounters:  08/20/23 239 lb 12.8 oz (108.8 kg)  05/15/23 242 lb 12.8 oz (110.1 kg)  03/20/23 240 lb (108.9 kg)     GEN: Well nourished, well developed in no acute distress CARDIAC: RRR, no murmurs, rubs, gallops RESPIRATORY:  Normal work of breathing MUSCULOSKELETAL: no edema    ASSESSMENT & PLAN:    Frequent PVCs: Burden has been up to 30%  Symptomatic with fatigue, palpitations; EF is normal.  Symptoms somewhat better with propranolol  Will try to eradicate PVCs to improve symptoms.  He has not had significant benefit from magnesium  and propranolol  Start amiodarone  200 mg twice daily for 2 weeks, then 200 mg daily Check TSH, hepatic panel in 4-6 weeks.  CAD S/p CABG 2018 Continue aspirin  81, fish oil.  He is intolerant to statins.  DM He reports ozempic has helped with BG control  HTN BP controlled today.           Medication Adjustments/Labs and Tests Ordered: Current medicines are reviewed at length with the patient today.  Concerns regarding medicines are outlined above.  Orders Placed This Encounter  Procedures   EKG 12-Lead   No orders of the defined types were placed in this encounter.    Signed, Eulas FORBES Furbish, MD  08/20/2023 12:20 PM    [facility]

## 2023-08-20 NOTE — Patient Instructions (Signed)
 Medication Instructions:  STOP Magnesium  START Amiodarone  200 mg twice daily for 14 days, then DECREASE to 200 mg once daily  *If you need a refill on your cardiac medications before your next appointment, please call your pharmacy*   Lab Work: TSH in 1 month (week on February 3rd) - this can be completed at ANY [facility] - no appointment required and this does not have to be fasting If you have labs (blood work) drawn today and your tests are completely normal, you will receive your results only by: MyChart Message (if you have MyChart) OR A paper copy in the mail If you have any lab test that is abnormal or we need to change your treatment, we will call you to review the results.   Follow-Up: At [facility], you and your health needs are our priority.  As part of our continuing mission to provide you with exceptional heart care, we have created designated Provider Care Teams.  These Care Teams include your primary Cardiologist (physician) and Advanced Practice Providers (APPs -  Physician Assistants and Nurse Practitioners) who all work together to provide you with the care you need, when you need it.  We recommend signing up for the patient portal called MyChart.  Sign up information is provided on this After Visit Summary.  MyChart is used to connect with patients for Virtual Visits (Telemedicine).  Patients are able to view lab/test results, encounter notes, upcoming appointments, etc.  Non-urgent messages can be sent to your provider as well.   To learn more about what you can do with MyChart, go to [email or web address].    Your next appointment:   3 month(s)  Provider:   Eulas Furbish, MD

## 2023-08-23 DIAGNOSIS — E119 Type 2 diabetes mellitus without complications: Secondary | ICD-10-CM | POA: Diagnosis not present

## 2023-08-23 DIAGNOSIS — I10 Essential (primary) hypertension: Secondary | ICD-10-CM | POA: Diagnosis not present

## 2023-09-14 ENCOUNTER — Encounter: Payer: Self-pay | Admitting: Cardiovascular Disease

## 2023-09-14 DIAGNOSIS — E119 Type 2 diabetes mellitus without complications: Secondary | ICD-10-CM | POA: Diagnosis not present

## 2023-09-14 DIAGNOSIS — I10 Essential (primary) hypertension: Secondary | ICD-10-CM | POA: Diagnosis not present

## 2023-09-18 DIAGNOSIS — I493 Ventricular premature depolarization: Secondary | ICD-10-CM | POA: Diagnosis not present

## 2023-09-18 DIAGNOSIS — R002 Palpitations: Secondary | ICD-10-CM | POA: Diagnosis not present

## 2023-09-18 DIAGNOSIS — I4729 Other ventricular tachycardia: Secondary | ICD-10-CM | POA: Diagnosis not present

## 2023-09-18 NOTE — Telephone Encounter (Signed)
 Attempted to call patient, no answer left message requesting a call back.

## 2023-09-19 LAB — TSH RFX ON ABNORMAL TO FREE T4: TSH: 1.92 u[IU]/mL (ref 0.450–4.500)

## 2023-09-19 NOTE — Telephone Encounter (Signed)
 Patient identification verified by 2 forms. Bertina Cooks, RN    Called and spoke to patient  Patient states:   -blood pressure has been elevated for a few weeks   -1/15 was taking amlodipine  5mg  twice a day for 14 days then decreased to daily  -during the time he was taking amlodipine  twice a day blood pressure was high   -takes amiodarone , propranolol  and valsartan  ever morning   -takes amlodipine  every night  -does not keep a log of blood pressure   -typically checks BP before taking medications  -BP yesterday morning 152/79 was before taking his medications  Patient denies:   -headaches   -chest pressure/chest pain   -SOB/difficulty breathing   -visual changes/disturbances  Informed patient:   -BP elevated prior to medications can be expected   -Plan to check blood pressure 1-2 hours after taking medications   -keep blood pressure log and send via mychart in 1-2 weeks  Reviewed ED warning signs/precautions  Patient verbalized understanding, no questions at this time

## 2023-09-20 DIAGNOSIS — I10 Essential (primary) hypertension: Secondary | ICD-10-CM | POA: Diagnosis not present

## 2023-09-20 DIAGNOSIS — Z299 Encounter for prophylactic measures, unspecified: Secondary | ICD-10-CM | POA: Diagnosis not present

## 2023-09-20 DIAGNOSIS — N400 Enlarged prostate without lower urinary tract symptoms: Secondary | ICD-10-CM | POA: Diagnosis not present

## 2023-09-20 DIAGNOSIS — R52 Pain, unspecified: Secondary | ICD-10-CM | POA: Diagnosis not present

## 2023-09-20 DIAGNOSIS — E1169 Type 2 diabetes mellitus with other specified complication: Secondary | ICD-10-CM | POA: Diagnosis not present

## 2023-09-22 DIAGNOSIS — E119 Type 2 diabetes mellitus without complications: Secondary | ICD-10-CM | POA: Diagnosis not present

## 2023-09-22 DIAGNOSIS — I10 Essential (primary) hypertension: Secondary | ICD-10-CM | POA: Diagnosis not present

## 2023-09-25 ENCOUNTER — Encounter: Payer: Self-pay | Admitting: Cardiovascular Disease

## 2023-10-12 DIAGNOSIS — E119 Type 2 diabetes mellitus without complications: Secondary | ICD-10-CM | POA: Diagnosis not present

## 2023-10-12 DIAGNOSIS — I10 Essential (primary) hypertension: Secondary | ICD-10-CM | POA: Diagnosis not present

## 2023-10-20 ENCOUNTER — Other Ambulatory Visit: Payer: Self-pay

## 2023-10-20 ENCOUNTER — Emergency Department (HOSPITAL_COMMUNITY)

## 2023-10-20 ENCOUNTER — Encounter (HOSPITAL_COMMUNITY): Payer: Self-pay | Admitting: *Deleted

## 2023-10-20 ENCOUNTER — Emergency Department (HOSPITAL_COMMUNITY)
Admission: EM | Admit: 2023-10-20 | Discharge: 2023-10-20 | Disposition: A | Discharge status: Home / Self Care | Attending: Emergency Medicine | Admitting: Emergency Medicine

## 2023-10-20 DIAGNOSIS — R61 Generalized hyperhidrosis: Secondary | ICD-10-CM | POA: Insufficient documentation

## 2023-10-20 DIAGNOSIS — R0602 Shortness of breath: Secondary | ICD-10-CM | POA: Diagnosis not present

## 2023-10-20 DIAGNOSIS — R079 Chest pain, unspecified: Secondary | ICD-10-CM

## 2023-10-20 DIAGNOSIS — R0789 Other chest pain: Secondary | ICD-10-CM | POA: Insufficient documentation

## 2023-10-20 DIAGNOSIS — R42 Dizziness and giddiness: Secondary | ICD-10-CM | POA: Diagnosis not present

## 2023-10-20 DIAGNOSIS — Z7982 Long term (current) use of aspirin: Secondary | ICD-10-CM | POA: Diagnosis not present

## 2023-10-20 LAB — BASIC METABOLIC PANEL
Anion gap: 13 (ref 5–15)
BUN: 17 mg/dL (ref 8–23)
CO2: 29 mmol/L (ref 22–32)
Calcium: 9.3 mg/dL (ref 8.9–10.3)
Chloride: 96 mmol/L — ABNORMAL LOW (ref 98–111)
Creatinine, Ser: 1.01 mg/dL (ref 0.61–1.24)
GFR, Estimated: >60 mL/min (ref >60)
Glucose, Bld: 187 mg/dL — ABNORMAL HIGH (ref 70–99)
Potassium: 4 mmol/L (ref 3.5–5.1)
Sodium: 138 mmol/L (ref 135–145)

## 2023-10-20 LAB — CBC
HCT: 43.9 % (ref 39.0–52.0)
Hemoglobin: 14.4 g/dL (ref 13.0–17.0)
MCH: 31.2 pg (ref 26.0–34.0)
MCHC: 32.8 g/dL (ref 30.0–36.0)
MCV: 95 fL (ref 80.0–100.0)
Platelets: 252 10*3/uL (ref 150–400)
RBC: 4.62 MIL/uL (ref 4.22–5.81)
RDW: 12.9 % (ref 11.5–15.5)
WBC: 3.9 10*3/uL — ABNORMAL LOW (ref 4.0–10.5)
nRBC: 0 % (ref 0.0–0.2)

## 2023-10-20 LAB — TROPONIN I (HIGH SENSITIVITY)
Troponin I (High Sensitivity): 3 ng/L (ref <18)
Troponin I (High Sensitivity): 3 ng/L (ref <18)

## 2023-10-20 NOTE — Discharge Instructions (Signed)
 You were seen in the emergency department for evaluation of some left-sided chest pain.  You had blood work EKG and chest x-ray that did not show any evidence of heart attack.  Please continue your regular medications and follow-up with your cardiologist and primary care doctor.  This is likely muscular you can also try ice or heat to the area and Tylenol for pain.  Return if any worsening or concerning symptoms

## 2023-10-20 NOTE — ED Triage Notes (Signed)
 Pt c/o left side chest pain that stared x one week ago  Pt states the pain is worse with movement   Pt denies any n/v or sob

## 2023-10-20 NOTE — ED Provider Notes (Signed)
 [place] EMERGENCY DEPARTMENT AT [facility] Provider Note   CSN: [id number] Arrival date & time: 10/20/23  1012     History  Chief Complaint  Patient presents with   Chest Pain    William Orr is a 78 y.o. male.  He has been having left-sided chest pain for about a week.  He said it is an achiness and feels muscular.  Worse with movement.  Not associated with any shortness of breath diaphoresis dizziness.  He does not recall any trauma.  He does have a history of coronary bypass and so he wanted to make sure it was not his heart.  He is also noticed his blood pressures have been running a little higher than usual.  The history is provided by the patient.  Chest Pain Pain location:  L chest Pain quality: aching   Pain radiates to:  Does not radiate Pain severity:  Moderate Onset quality:  Gradual Duration:  1 week Timing:  Intermittent Progression:  Unchanged Chronicity:  New Context: movement   Relieved by:  None tried Worsened by:  Movement Ineffective treatments:  None tried Associated symptoms: no abdominal pain, no cough, no diaphoresis, no fever, no nausea, no shortness of breath and no vomiting        Home Medications Prior to Admission medications   Medication Sig Start Date End Date Taking? Authorizing Provider  acetaminophen (TYLENOL) 650 MG CR tablet Take 650 mg by mouth every 8 (eight) hours as needed for pain.    [provider]  Alirocumab (PRALUENT) 75 MG/ML SOAJ Inject 1 mL every 2 weeks by subcutaneous route.    [provider]  amiodarone (PACERONE) 200 MG tablet Take 1 tablet by mouth twice daily for 14 days, then DECREASE to 1 tablet ONCE daily 08/20/23   Mealor, Roberts Gaudy, MD  amLODipine (NORVASC) 5 MG tablet Take 1 tablet (5 mg total) by mouth daily. 05/15/23 08/13/23  Mealor, Roberts Gaudy, MD  aspirin EC 81 MG EC tablet Take 1 tablet (81 mg total) by mouth daily. 01/10/17   Barrett, Erin R, PA-C  cetirizine (ZYRTEC) 10 MG  tablet Take 10 mg by mouth daily as needed for allergies.     [provider]  chlorthalidone (HYGROTON) 25 MG tablet Take 1 tablet (25 mg total) by mouth daily. 06/16/22   Azalee Course, PA  Cholecalciferol (VITAMIN D-3) 5000 UNITS TABS Take 10,000 Units by mouth every morning.     [provider]  fluticasone (FLONASE) 50 MCG/ACT nasal spray Place 1 spray into both nostrils daily as needed (seasonal allergies).  08/11/13   [provider]  glipiZIDE (GLUCOTROL) 10 MG tablet Take 10 mg by mouth 2 (two) times daily before a meal. Patient not taking: Reported on 08/20/2023    [provider]  metFORMIN (GLUCOPHAGE-XR) 500 MG 24 hr tablet Take 500 mg by mouth 2 (two) times daily.    [provider]  Misc Natural Products (TURMERIC CURCUMIN) CAPS Take by mouth 2 (two) times daily.    [provider]  Multiple Vitamin (MULTIVITAMIN WITH MINERALS) TABS tablet Take 2 tablets by mouth every morning.     [provider]  Multiple Vitamin (MULTIVITAMIN) tablet Take 1 tablet by mouth daily.    [provider]  NON FORMULARY Take 2 capsules by mouth daily. Amazin Grape: MUscadine Grapeseed/Skin    [provider]  Omega-3 Fatty Acids (FISH OIL) 1000 MG CAPS Take 2,000 mg by mouth 2 (two) times daily.  [provider]  propranolol ER (INDERAL LA) 160 MG SR capsule Take 1 capsule (160 mg total) by mouth daily. 05/15/23   Mealor, Roberts Gaudy, MD  Semaglutide (OZEMPIC, 1 MG/DOSE, [place]) Inject into the skin.    [provider]  valsartan (DIOVAN) 320 MG tablet Take 1 tablet (320 mg total) by mouth daily. 10/26/21 01/15/23  Lyn Records, MD  zinc gluconate 50 MG tablet Take 50 mg by mouth daily.    [provider]      Allergies    Jardiance [empagliflozin], Lisinopril, and Statins    Review of Systems   Review of Systems  Constitutional:  Negative for diaphoresis and fever.  Respiratory:  Negative for cough and  shortness of breath.   Cardiovascular:  Positive for chest pain.  Gastrointestinal:  Negative for abdominal pain, nausea and vomiting.    Physical Exam Updated Vital Signs BP (!) 155/74 (BP Location: Left Arm)   Pulse 65   Temp 97.7 F (36.5 C) (Oral)   Resp 18   Ht 6\' 2"  (1.88 m)   Wt 110.4 kg   SpO2 96%   BMI 31.24 kg/m  Physical Exam Vitals and nursing note reviewed.  Constitutional:      General: He is not in acute distress.    Appearance: He is well-developed.  HENT:     Head: Normocephalic and atraumatic.  Eyes:     Conjunctiva/sclera: Conjunctivae normal.  Cardiovascular:     Rate and Rhythm: Normal rate and regular rhythm.     Heart sounds: Normal heart sounds. No murmur heard. Pulmonary:     Effort: Pulmonary effort is normal. No respiratory distress.     Breath sounds: Normal breath sounds.  Abdominal:     Palpations: Abdomen is soft.     Tenderness: There is no abdominal tenderness.  Musculoskeletal:        General: No swelling.     Cervical back: Neck supple.     Right lower leg: No tenderness. No edema.     Left lower leg: No tenderness. No edema.  Skin:    General: Skin is warm and dry.     Capillary Refill: Capillary refill takes less than 2 seconds.  Neurological:     General: No focal deficit present.     Mental Status: He is alert.     ED Results / Procedures / Treatments   Labs (all labs ordered are listed, but only abnormal results are displayed) Labs Reviewed  BASIC METABOLIC PANEL - Abnormal; Notable for the following components:      Result Value   Chloride 96 (*)    Glucose, Bld 187 (*)    All other components within normal limits  CBC - Abnormal; Notable for the following components:   WBC 3.9 (*)    All other components within normal limits  TROPONIN I (HIGH SENSITIVITY)  TROPONIN I (HIGH SENSITIVITY)    EKG EKG Interpretation Date/Time:  Saturday October 20 2023 10:25:19 EST Ventricular Rate:  63 PR Interval:    QRS  Duration:  102 QT Interval:  404 QTC Calculation: 413 R Axis:   106  Text Interpretation: Normal sinus rhythm Rightward axis Cannot rule out Anterior infarct , age undetermined Abnormal ECG When compared with ECG of 20-Aug-2023 11:50, Nonspecific T wave abnormality now evident in Anterolateral leads Confirmed by Meridee Score [phone]) on 10/20/2023 10:40:33 AM  Radiology DG Chest 2 View Result Date: 10/20/2023 CLINICAL DATA:  Chest pain EXAM: CHEST - 2 VIEW  COMPARISON:  03/20/2023 FINDINGS: The lungs are clear without focal pneumonia, edema, pneumothorax or pleural effusion. The cardiopericardial silhouette is within normal limits for size. Status post CABG. No acute bony abnormality. IMPRESSION: No active cardiopulmonary disease. Electronically Signed   By: Kennith Center M.D.   On: 10/20/2023 10:44    Procedures Procedures    Medications Ordered in ED Medications - No data to display  ED Course/ Medical Decision Making/ A&P Clinical Course as of 10/20/23 1247  Sat Oct 20, 2023  1241 X-ray interpreted by me as no acute infiltrate.  Awaiting radiology reading. [MB]    Clinical Course User Index [MB] Terrilee Files, MD                                 Medical Decision Making Amount and/or Complexity of Data Reviewed Labs: ordered. Radiology: ordered.   This patient complains of chest pain; this involves an extensive number of treatment Options and is a complaint that carries with it a high risk of complications and morbidity. The differential includes ACS, pneumonia, pneumothorax, PE, vascular, musculoskeletal  I ordered, reviewed and interpreted labs, which included CBC with low white count, chemistries with elevated glucose, troponins flat I ordered imaging studies which included chest x-ray and I independently    visualized and interpreted imaging which showed no acute findings  Previous records obtained and reviewed in epic including recent cardiology notes Cardiac  monitoring reviewed, normal sinus rhythm Social determinants considered, no significant barriers Critical Interventions: None  After the interventions stated above, I reevaluated the patient and found patient to be well-appearing in no distress Admission and further testing considered, no indications for admission or further workup at this time.  Recommended close outpatient follow-up with cardiology.  Return instructions discussed         Final Clinical Impression(s) / ED Diagnoses Final diagnoses:  Nonspecific chest pain    Rx / DC Orders ED Discharge Orders     None         Terrilee Files, MD 10/20/23 1723

## 2023-10-22 DIAGNOSIS — I10 Essential (primary) hypertension: Secondary | ICD-10-CM | POA: Diagnosis not present

## 2023-10-22 DIAGNOSIS — E119 Type 2 diabetes mellitus without complications: Secondary | ICD-10-CM | POA: Diagnosis not present

## 2023-11-12 DIAGNOSIS — E119 Type 2 diabetes mellitus without complications: Secondary | ICD-10-CM | POA: Diagnosis not present

## 2023-11-12 DIAGNOSIS — I10 Essential (primary) hypertension: Secondary | ICD-10-CM | POA: Diagnosis not present

## 2023-11-14 ENCOUNTER — Ambulatory Visit: Payer: Non-veteran care | Attending: Cardiovascular Disease | Admitting: Cardiovascular Disease

## 2023-11-14 ENCOUNTER — Encounter: Payer: Self-pay | Admitting: Cardiovascular Disease

## 2023-11-14 VITALS — BP 120/64 | HR 60 | Ht 74.0 in | Wt 240.4 lb

## 2023-11-14 DIAGNOSIS — R002 Palpitations: Secondary | ICD-10-CM | POA: Diagnosis not present

## 2023-11-14 DIAGNOSIS — I4729 Other ventricular tachycardia: Secondary | ICD-10-CM | POA: Diagnosis not present

## 2023-11-14 DIAGNOSIS — I493 Ventricular premature depolarization: Secondary | ICD-10-CM | POA: Diagnosis not present

## 2023-11-14 MED ORDER — AMIODARONE HCL 200 MG PO TABS: 100.0000 mg | ORAL_TABLET | Freq: Every day | ORAL | 3 refills | Status: DC

## 2023-11-14 NOTE — Patient Instructions (Signed)
 Medication Instructions:  DECREASE Amiodarone to 100 mg once daily  *If you need a refill on your cardiac medications before your next appointment, please call your pharmacy*  Follow-Up: At [facility], you and your health needs are our priority.  As part of our continuing mission to provide you with exceptional heart care, our providers are all part of one team.  This team includes your primary Cardiologist (physician) and Advanced Practice Providers or APPs (Physician Assistants and Nurse Practitioners) who all work together to provide you with the care you need, when you need it.  Your next appointment:   6 month(s)  Provider:   York Pellant, MD       1st Floor: - Lobby - Registration  - Pharmacy  - Lab - Cafe  2nd Floor: - PV Lab - Diagnostic Testing (echo, CT, nuclear med)  3rd Floor: - Vacant  4th Floor: - TCTS (cardiothoracic surgery) - AFib Clinic - Structural Heart Clinic - Vascular Surgery  - Vascular Ultrasound  5th Floor: - HeartCare Cardiology (general and EP) - Clinical Pharmacy for coumadin, hypertension, lipid, weight-loss medications, and med management appointments    Valet parking services will be available as well.

## 2023-11-14 NOTE — Progress Notes (Signed)
 Electrophysiology Office Note:    Date:  11/14/2023   ID:  Hasheem Voland, DOB 1946/05/19, MRN [id number]  PCP:  Kirstie Peri, MD   [facility] Providers Cardiologist:  Nicki Guadalajara, MD Electrophysiologist:  Maurice Small, MD     Referring MD: Kirstie Peri, MD   History of Present Illness:    William Orr is a 78 y.o. male with a hx listed below, significant for CAD status post coronary bypass in 2018, diabetes, hypertension, hyperlipidemia, frequent PVCs  referred for arrhythmia management.  He has had skipped beats and irregular heart rhythms for years, but they have gotten worse over the past several months.  He thinks change may have occurred after he started Ozempic.  During episodes, he has palpitations, shortness of breath, fatigue.  I reviewed strips from his cardia mobile device again today.  These showed occasional PVCs and correlated with his symptoms.  We began propranolol and magnesium taurate but it has had little effect. He continues to have palpitations.  He occasionally has chest discomfort associated with PVCs, not with exertion.  He has been using a Kardia mobile device to check his heart rhythm.  Prior to starting amiodarone, all readings showed frequent PVCs.  Since starting amiodarone, he has had rare recordings that have detected PVCs.  EKGs/Labs/Other Studies Reviewed Today:    Echocardiogram:  TTE 11/08/2022 EF 60-65%, mildly dilated LA. RV is mildly dilated   Monitors:  Zio - 14 days  12/2022 - my intepretation Sinus rhythm, heart rate 58 to 91 bpm, average 71 bpm.  18.7% PVCs.  Patient triggered events correlated with PVCs.     Zio 14 days 08/2021 -- reviewed       4.9% PVCs  Stress testing:  10/28/2021 Spect Low risk  Advanced imaging:   Cardiac catherization   EKG:   EKG Interpretation Date/Time:  Wednesday November 14 2023 13:58:33 EDT Ventricular Rate:  61 PR Interval:  212 QRS Duration:  106 QT Interval:  404 QTC  Calculation: 406 R Axis:   102  Text Interpretation: Sinus rhythm with 1st degree A-V block Rightward axis When compared with ECG of 20-Oct-2023 10:25, No significant change was found Confirmed by York Pellant [phone]) on 11/14/2023 2:31:52 PM    Recent Labs: 03/20/2023: Magnesium 1.9 09/18/2023: TSH 1.920 10/20/2023: BUN 17; Creatinine, Ser 1.01; Hemoglobin 14.4; Platelets 252; Potassium 4.0; Sodium 138     Physical Exam:    VS:  BP 120/64 (BP Location: Left Arm, Patient Position: Sitting, Cuff Size: Large)   Pulse 60   Ht 6\' 2"  (1.88 m)   Wt 240 lb 6.4 oz (109 kg)   SpO2 97%   BMI 30.87 kg/m     Wt Readings from Last 3 Encounters:  11/14/23 240 lb 6.4 oz (109 kg)  10/20/23 243 lb 4.8 oz (110.4 kg)  08/20/23 239 lb 12.8 oz (108.8 kg)     GEN: Well nourished, well developed in no acute distress CARDIAC: RRR, no murmurs, rubs, gallops RESPIRATORY:  Normal work of breathing MUSCULOSKELETAL: no edema    ASSESSMENT & PLAN:    Frequent PVCs: Burden has been up to 30%  Symptomatic with fatigue, palpitations; EF is normal.  Symptoms somewhat better with propranolol Will try to eradicate PVCs to improve symptoms.  He has not had significant benefit from magnesium and propranolol Decrease amiodarone to 100mg  daily -- he may increase back to 200 mg if PVCs return TSH ok 09/2023  CAD S/p CABG 2018 Continue aspirin 81, fish  oil.  He is intolerant to statins.  DM He reports ozempic has helped with BG control  HTN BP controlled today.           Medication Adjustments/Labs and Tests Ordered: Current medicines are reviewed at length with the patient today.  Concerns regarding medicines are outlined above.  Orders Placed This Encounter  Procedures   EKG 12-Lead   No orders of the defined types were placed in this encounter.    Signed, Maurice Small, MD  11/14/2023 2:31 PM    [facility]

## 2023-11-21 DIAGNOSIS — I10 Essential (primary) hypertension: Secondary | ICD-10-CM | POA: Diagnosis not present

## 2023-11-21 DIAGNOSIS — E119 Type 2 diabetes mellitus without complications: Secondary | ICD-10-CM | POA: Diagnosis not present

## 2023-12-12 DIAGNOSIS — I10 Essential (primary) hypertension: Secondary | ICD-10-CM | POA: Diagnosis not present

## 2023-12-12 DIAGNOSIS — E119 Type 2 diabetes mellitus without complications: Secondary | ICD-10-CM | POA: Diagnosis not present

## 2023-12-21 DIAGNOSIS — I10 Essential (primary) hypertension: Secondary | ICD-10-CM | POA: Diagnosis not present

## 2023-12-21 DIAGNOSIS — E119 Type 2 diabetes mellitus without complications: Secondary | ICD-10-CM | POA: Diagnosis not present

## 2023-12-25 DIAGNOSIS — I700 Atherosclerosis of aorta: Secondary | ICD-10-CM | POA: Diagnosis not present

## 2023-12-25 DIAGNOSIS — Z299 Encounter for prophylactic measures, unspecified: Secondary | ICD-10-CM | POA: Diagnosis not present

## 2023-12-25 DIAGNOSIS — E1169 Type 2 diabetes mellitus with other specified complication: Secondary | ICD-10-CM | POA: Diagnosis not present

## 2023-12-25 DIAGNOSIS — R52 Pain, unspecified: Secondary | ICD-10-CM | POA: Diagnosis not present

## 2023-12-25 DIAGNOSIS — I10 Essential (primary) hypertension: Secondary | ICD-10-CM | POA: Diagnosis not present

## 2023-12-25 DIAGNOSIS — G4730 Sleep apnea, unspecified: Secondary | ICD-10-CM | POA: Diagnosis not present

## 2024-01-12 DIAGNOSIS — E119 Type 2 diabetes mellitus without complications: Secondary | ICD-10-CM | POA: Diagnosis not present

## 2024-01-12 DIAGNOSIS — I10 Essential (primary) hypertension: Secondary | ICD-10-CM | POA: Diagnosis not present

## 2024-01-20 DIAGNOSIS — E119 Type 2 diabetes mellitus without complications: Secondary | ICD-10-CM | POA: Diagnosis not present

## 2024-01-20 DIAGNOSIS — I10 Essential (primary) hypertension: Secondary | ICD-10-CM | POA: Diagnosis not present

## 2024-01-29 DIAGNOSIS — R52 Pain, unspecified: Secondary | ICD-10-CM | POA: Diagnosis not present

## 2024-01-29 DIAGNOSIS — Z1339 Encounter for screening examination for other mental health and behavioral disorders: Secondary | ICD-10-CM | POA: Diagnosis not present

## 2024-01-29 DIAGNOSIS — I10 Essential (primary) hypertension: Secondary | ICD-10-CM | POA: Diagnosis not present

## 2024-01-29 DIAGNOSIS — Z125 Encounter for screening for malignant neoplasm of prostate: Secondary | ICD-10-CM | POA: Diagnosis not present

## 2024-01-29 DIAGNOSIS — E7800 Pure hypercholesterolemia, unspecified: Secondary | ICD-10-CM | POA: Diagnosis not present

## 2024-01-29 DIAGNOSIS — Z79899 Other long term (current) drug therapy: Secondary | ICD-10-CM | POA: Diagnosis not present

## 2024-01-29 DIAGNOSIS — Z299 Encounter for prophylactic measures, unspecified: Secondary | ICD-10-CM | POA: Diagnosis not present

## 2024-01-29 DIAGNOSIS — Z1331 Encounter for screening for depression: Secondary | ICD-10-CM | POA: Diagnosis not present

## 2024-01-29 DIAGNOSIS — Z7189 Other specified counseling: Secondary | ICD-10-CM | POA: Diagnosis not present

## 2024-01-29 DIAGNOSIS — R5383 Other fatigue: Secondary | ICD-10-CM | POA: Diagnosis not present

## 2024-01-29 DIAGNOSIS — Z0000 Encounter for general adult medical examination without abnormal findings: Secondary | ICD-10-CM | POA: Diagnosis not present

## 2024-02-19 DIAGNOSIS — E119 Type 2 diabetes mellitus without complications: Secondary | ICD-10-CM | POA: Diagnosis not present

## 2024-02-19 DIAGNOSIS — I10 Essential (primary) hypertension: Secondary | ICD-10-CM | POA: Diagnosis not present

## 2024-03-13 DIAGNOSIS — I10 Essential (primary) hypertension: Secondary | ICD-10-CM | POA: Diagnosis not present

## 2024-03-13 DIAGNOSIS — E119 Type 2 diabetes mellitus without complications: Secondary | ICD-10-CM | POA: Diagnosis not present

## 2024-03-20 DIAGNOSIS — E119 Type 2 diabetes mellitus without complications: Secondary | ICD-10-CM | POA: Diagnosis not present

## 2024-03-20 DIAGNOSIS — I10 Essential (primary) hypertension: Secondary | ICD-10-CM | POA: Diagnosis not present

## 2024-03-31 DIAGNOSIS — I2510 Atherosclerotic heart disease of native coronary artery without angina pectoris: Secondary | ICD-10-CM | POA: Diagnosis not present

## 2024-03-31 DIAGNOSIS — R52 Pain, unspecified: Secondary | ICD-10-CM | POA: Diagnosis not present

## 2024-03-31 DIAGNOSIS — Z299 Encounter for prophylactic measures, unspecified: Secondary | ICD-10-CM | POA: Diagnosis not present

## 2024-03-31 DIAGNOSIS — I10 Essential (primary) hypertension: Secondary | ICD-10-CM | POA: Diagnosis not present

## 2024-03-31 DIAGNOSIS — I472 Ventricular tachycardia, unspecified: Secondary | ICD-10-CM | POA: Diagnosis not present

## 2024-03-31 DIAGNOSIS — E119 Type 2 diabetes mellitus without complications: Secondary | ICD-10-CM | POA: Diagnosis not present

## 2024-03-31 DIAGNOSIS — Z0000 Encounter for general adult medical examination without abnormal findings: Secondary | ICD-10-CM | POA: Diagnosis not present

## 2024-04-13 DIAGNOSIS — I10 Essential (primary) hypertension: Secondary | ICD-10-CM | POA: Diagnosis not present

## 2024-04-13 DIAGNOSIS — E119 Type 2 diabetes mellitus without complications: Secondary | ICD-10-CM | POA: Diagnosis not present

## 2024-04-19 DIAGNOSIS — I10 Essential (primary) hypertension: Secondary | ICD-10-CM | POA: Diagnosis not present

## 2024-04-19 DIAGNOSIS — E119 Type 2 diabetes mellitus without complications: Secondary | ICD-10-CM | POA: Diagnosis not present

## 2024-04-30 ENCOUNTER — Other Ambulatory Visit: Payer: Self-pay

## 2024-04-30 ENCOUNTER — Emergency Department (HOSPITAL_COMMUNITY)

## 2024-04-30 ENCOUNTER — Emergency Department (HOSPITAL_COMMUNITY): Admission: EM | Admit: 2024-04-30 | Discharge: 2024-04-30 | Disposition: A | Discharge status: Home / Self Care

## 2024-04-30 ENCOUNTER — Encounter (HOSPITAL_COMMUNITY): Payer: Self-pay

## 2024-04-30 DIAGNOSIS — R079 Chest pain, unspecified: Secondary | ICD-10-CM | POA: Insufficient documentation

## 2024-04-30 DIAGNOSIS — Z7982 Long term (current) use of aspirin: Secondary | ICD-10-CM | POA: Diagnosis not present

## 2024-04-30 DIAGNOSIS — R0789 Other chest pain: Secondary | ICD-10-CM | POA: Diagnosis present

## 2024-04-30 DIAGNOSIS — M549 Dorsalgia, unspecified: Secondary | ICD-10-CM | POA: Diagnosis not present

## 2024-04-30 LAB — CBC WITH DIFFERENTIAL/PLATELET
Abs Immature Granulocytes: 0.01 K/uL (ref 0.00–0.07)
Basophils Absolute: 0.1 K/uL (ref 0.0–0.1)
Basophils Relative: 1 %
Eosinophils Absolute: 0.3 K/uL (ref 0.0–0.5)
Eosinophils Relative: 6 %
HCT: 41.9 % (ref 39.0–52.0)
Hemoglobin: 14.1 g/dL (ref 13.0–17.0)
Immature Granulocytes: 0 %
Lymphocytes Relative: 35 %
Lymphs Abs: 1.4 K/uL (ref 0.7–4.0)
MCH: 32.2 pg (ref 26.0–34.0)
MCHC: 33.7 g/dL (ref 30.0–36.0)
MCV: 95.7 fL (ref 80.0–100.0)
Monocytes Absolute: 0.5 K/uL (ref 0.1–1.0)
Monocytes Relative: 12 %
Neutro Abs: 1.8 K/uL (ref 1.7–7.7)
Neutrophils Relative %: 46 %
Platelets: 193 K/uL (ref 150–400)
RBC: 4.38 MIL/uL (ref 4.22–5.81)
RDW: 12.4 % (ref 11.5–15.5)
WBC: 4 K/uL (ref 4.0–10.5)
nRBC: 0 % (ref 0.0–0.2)

## 2024-04-30 LAB — BASIC METABOLIC PANEL WITH GFR
Anion gap: 14 (ref 5–15)
BUN: 14 mg/dL (ref 8–23)
CO2: 29 mmol/L (ref 22–32)
Calcium: 9.5 mg/dL (ref 8.9–10.3)
Chloride: 97 mmol/L — ABNORMAL LOW (ref 98–111)
Creatinine, Ser: 1.07 mg/dL (ref 0.61–1.24)
GFR, Estimated: >60 mL/min (ref >60)
Glucose, Bld: 141 mg/dL — ABNORMAL HIGH (ref 70–99)
Potassium: 3.5 mmol/L (ref 3.5–5.1)
Sodium: 140 mmol/L (ref 135–145)

## 2024-04-30 LAB — TROPONIN I (HIGH SENSITIVITY)
Troponin I (High Sensitivity): 3 ng/L (ref <18)
Troponin I (High Sensitivity): 2 ng/L (ref <18)

## 2024-04-30 MED ORDER — KETOROLAC TROMETHAMINE 15 MG/ML IJ SOLN
15.0000 mg | Freq: Once | INTRAMUSCULAR | Status: AC
  Administered 2024-04-30: 15 mg via INTRAMUSCULAR
  Filled 2024-04-30: qty 1

## 2024-04-30 NOTE — Discharge Instructions (Signed)
 You can take Tylenol  and Motrin as needed for pain.  Call your primary care doctor and follow-up with them in 1 to 2 weeks.  Return to the ER for any new or worsening symptoms.

## 2024-04-30 NOTE — ED Provider Notes (Signed)
 [place] EMERGENCY DEPARTMENT AT [facility] Provider Note   CSN: [id number] Arrival date & time: 04/30/24  1819     Patient presents with: Chest Pain and Back Pain   William Orr is a 78 y.o. male.   78 year old male presents for evaluation of chest pain.  He states this started in the left side of his back and started rating to his chest.  He has had a history of CABG before but thinks it is likely muscle pain.  States it comes and goes and gets worse when he moves.  He denies any other symptoms or concerns.   Chest Pain Associated symptoms: back pain   Associated symptoms: no abdominal pain, no cough, no fever, no palpitations, no shortness of breath and no vomiting   Back Pain Associated symptoms: chest pain   Associated symptoms: no abdominal pain, no dysuria and no fever        Prior to Admission medications   Medication Sig Start Date End Date Taking? Authorizing Provider  acetaminophen  (TYLENOL ) 650 MG CR tablet Take 650 mg by mouth every 8 (eight) hours as needed for pain.    [provider]  Alirocumab (PRALUENT) 75 MG/ML SOAJ Inject 1 mL every 2 weeks by subcutaneous route.    [provider]  amiodarone  (PACERONE ) 200 MG tablet Take 0.5 tablets (100 mg total) by mouth daily. 11/14/23   Mealor, Augustus E, MD  amLODipine  (NORVASC ) 5 MG tablet Take 1 tablet (5 mg total) by mouth daily. 05/15/23 08/13/23  Mealor, Eulas BRAVO, MD  aspirin  EC 81 MG EC tablet Take 1 tablet (81 mg total) by mouth daily. 01/10/17   Barrett, Erin R, PA-C  cetirizine (ZYRTEC) 10 MG tablet Take 10 mg by mouth daily as needed for allergies.     [provider]  chlorthalidone  (HYGROTON ) 25 MG tablet Take 1 tablet (25 mg total) by mouth daily. 06/16/22   Meng, Hao, PA  Cholecalciferol  (VITAMIN D -3) 5000 UNITS TABS Take 10,000 Units by mouth every morning.     [provider]  fluticasone  (FLONASE ) 50 MCG/ACT nasal spray Place 1 spray into both nostrils  daily as needed (seasonal allergies).  08/11/13   [provider]  glipiZIDE  (GLUCOTROL ) 10 MG tablet Take 10 mg by mouth 2 (two) times daily before a meal. Patient not taking: Reported on 11/14/2023    [provider]  metFORMIN  (GLUCOPHAGE -XR) 500 MG 24 hr tablet Take 500 mg by mouth 2 (two) times daily.    [provider]  Misc Natural Products (TURMERIC CURCUMIN) CAPS Take by mouth 2 (two) times daily.    [provider]  Multiple Vitamin (MULTIVITAMIN WITH MINERALS) TABS tablet Take 2 tablets by mouth every morning.     [provider]  Multiple Vitamin (MULTIVITAMIN) tablet Take 1 tablet by mouth daily.    [provider]  NON FORMULARY Take 2 capsules by mouth daily. Amazin Grape: MUscadine Grapeseed/Skin    [provider]  Omega-3 Fatty Acids (FISH OIL) 1000 MG CAPS Take 2,000 mg by mouth 2 (two) times daily.    [provider]  propranolol  ER (INDERAL  LA) 160 MG SR capsule Take 1 capsule (160 mg total) by mouth daily. 05/15/23   Mealor, Augustus E, MD  Semaglutide (OZEMPIC, 1 MG/DOSE, [place]) Inject into the skin.    [provider]  valsartan  (DIOVAN ) 320 MG tablet Take 1 tablet (320 mg total) by mouth daily. 10/26/21 01/15/23  Claudene Victory ORN, MD  zinc gluconate 50 MG tablet Take 50 mg by mouth daily.    [provider]    Allergies: Jardiance [empagliflozin], Rosuvastatin , Lisinopril, Ezetimibe , Famotidine , Gemfibrozil, Lovastatin, Other, Simvastatin , and Statins    Review of Systems  Constitutional:  Negative for chills and fever.  HENT:  Negative for ear pain and sore throat.   Eyes:  Negative for pain and visual disturbance.  Respiratory:  Negative for cough and shortness of breath.   Cardiovascular:  Positive for chest pain. Negative for palpitations.  Gastrointestinal:  Negative for abdominal pain and vomiting.  Genitourinary:  Negative for dysuria and hematuria.  Musculoskeletal:  Positive for  back pain. Negative for arthralgias.  Skin:  Negative for color change and rash.  Neurological:  Negative for seizures and syncope.  All other systems reviewed and are negative.   Updated Vital Signs BP (!) 154/73 (BP Location: Right Arm)   Pulse 64   Temp 97.9 F (36.6 C) (Oral)   Resp 18   Ht 6' 2 (1.88 m)   Wt 108.9 kg   SpO2 96%   BMI 30.81 kg/m   Physical Exam Vitals and nursing note reviewed.  Constitutional:      General: He is not in acute distress.    Appearance: He is well-developed. He is not ill-appearing.  HENT:     Head: Normocephalic and atraumatic.  Eyes:     Conjunctiva/sclera: Conjunctivae normal.  Cardiovascular:     Rate and Rhythm: Normal rate and regular rhythm.     Heart sounds: No murmur heard. Pulmonary:     Effort: Pulmonary effort is normal. No respiratory distress.     Breath sounds: Normal breath sounds.  Abdominal:     Palpations: Abdomen is soft.     Tenderness: There is no abdominal tenderness.  Musculoskeletal:        General: No swelling.     Cervical back: Neck supple.  Skin:    General: Skin is warm and dry.     Capillary Refill: Capillary refill takes less than 2 seconds.  Neurological:     Mental Status: He is alert.  Psychiatric:        Mood and Affect: Mood normal.     (all labs ordered are listed, but only abnormal results are displayed) Labs Reviewed  BASIC METABOLIC PANEL WITH GFR - Abnormal; Notable for the following components:      Result Value   Chloride 97 (*)    Glucose, Bld 141 (*)    All other components within normal limits  CBC WITH DIFFERENTIAL/PLATELET  TROPONIN I (HIGH SENSITIVITY)  TROPONIN I (HIGH SENSITIVITY)    EKG: EKG Interpretation Date/Time:  Wednesday April 30 2024 18:28:10 EDT Ventricular Rate:  64 PR Interval:  214 QRS Duration:  102 QT Interval:  404 QTC Calculation: 416 R Axis:   13  Text Interpretation: Sinus rhythm with 1st degree A-V block Age indeterminate inferior  infarct  Age indeterminate anterior infarct Nonspecific T wave abnormalitie throughout Compared with EKG from 11/14/2023 Confirmed by Gennaro Bouchard ([id number]) on 04/30/2024 6:32:50 PM  Radiology: [facility] Chest 2 View Result Date: 04/30/2024 CLINICAL DATA:  Chest and back pain. EXAM: CHEST - 2 VIEW COMPARISON:  Chest radiograph dated 10/20/2023. FINDINGS: No focal consolidation, pleural effusion or pneumothorax. Stable cardiac silhouette. Median sternotomy wires. No acute osseous pathology. IMPRESSION: No active cardiopulmonary disease. Electronically Signed   By: Vanetta Chou M.D.   On: 04/30/2024 19:38     Procedures   Medications Ordered in  the William  ketorolac  (TORADOL ) 15 MG/ML injection 15 mg (15 mg Intramuscular Given 04/30/24 2134)                                    Medical Decision Making Cardiac monitor interpretation: Sinus rhythm, no ectopy  Patient's lab workup reviewed by me and unremarkable.  Troponin is negative.  It has been going on for a few days and sounds musculoskeletal in nature and not cardiac.  Patient appears overall very well.  Will give a dose of Toradol  here and he is advised Tylenol  Motrin as needed for pain and close follow-up with primary care and otherwise return to the ER for new or worsening symptoms.  Patient feels comfortable with the plan to be discharged home.  Problems Addressed: Atypical chest pain: acute illness or injury  Amount and/or Complexity of Data Reviewed External Data Reviewed: notes.    Details: Prior ER records reviewed and patient last seen in March for atypical chest pain Labs: ordered. Decision-making details documented in William Course.    Details: Ordered and reviewed by me and unremarkable, troponin negative Radiology: ordered and independent interpretation performed. Decision-making details documented in William Course.    Details: Ordered and interpreted independently of radiology Chest x-ray: Shows no acute abnormality ECG/medicine tests:  ordered and independent interpretation performed. Decision-making details documented in William Course.    Details: Ordered and inter by me in the absence of cardiology and shows sinus rhythm, no STEMI or acute change when compared to prior  Risk OTC drugs. Prescription drug management.     Final diagnoses:  Atypical chest pain    William Discharge Orders     None          Gennaro Duwaine CROME, DO 04/30/24 2141

## 2024-04-30 NOTE — ED Triage Notes (Signed)
 Reports upper back pain that radiates around to his left chest for the last couple days.  Reports back hurts worst.  Reports worse with movement.  Denies SOB, n/v sweating.

## 2024-05-05 DIAGNOSIS — I10 Essential (primary) hypertension: Secondary | ICD-10-CM | POA: Diagnosis not present

## 2024-05-05 DIAGNOSIS — Z299 Encounter for prophylactic measures, unspecified: Secondary | ICD-10-CM | POA: Diagnosis not present

## 2024-05-05 DIAGNOSIS — H8110 Benign paroxysmal vertigo, unspecified ear: Secondary | ICD-10-CM | POA: Diagnosis not present

## 2024-05-05 DIAGNOSIS — R42 Dizziness and giddiness: Secondary | ICD-10-CM | POA: Diagnosis not present

## 2024-05-05 DIAGNOSIS — R52 Pain, unspecified: Secondary | ICD-10-CM | POA: Diagnosis not present

## 2024-05-05 DIAGNOSIS — R079 Chest pain, unspecified: Secondary | ICD-10-CM | POA: Diagnosis not present

## 2024-05-13 DIAGNOSIS — E119 Type 2 diabetes mellitus without complications: Secondary | ICD-10-CM | POA: Diagnosis not present

## 2024-05-13 DIAGNOSIS — I10 Essential (primary) hypertension: Secondary | ICD-10-CM | POA: Diagnosis not present

## 2024-05-19 DIAGNOSIS — I10 Essential (primary) hypertension: Secondary | ICD-10-CM | POA: Diagnosis not present

## 2024-05-19 DIAGNOSIS — E119 Type 2 diabetes mellitus without complications: Secondary | ICD-10-CM | POA: Diagnosis not present

## 2024-05-22 ENCOUNTER — Ambulatory Visit: Admitting: Cardiovascular Disease

## 2024-05-22 ENCOUNTER — Ambulatory Visit: Attending: Cardiovascular Disease | Admitting: Cardiovascular Disease

## 2024-05-22 ENCOUNTER — Other Ambulatory Visit (HOSPITAL_BASED_OUTPATIENT_CLINIC_OR_DEPARTMENT_OTHER): Payer: Self-pay | Admitting: Cardiovascular Disease

## 2024-05-22 ENCOUNTER — Encounter: Payer: Self-pay | Admitting: Cardiovascular Disease

## 2024-05-22 VITALS — BP 146/72 | HR 67 | Ht 74.0 in | Wt 243.0 lb

## 2024-05-22 DIAGNOSIS — Z79899 Other long term (current) drug therapy: Secondary | ICD-10-CM

## 2024-05-22 DIAGNOSIS — I493 Ventricular premature depolarization: Secondary | ICD-10-CM

## 2024-05-22 MED ORDER — PROPRANOLOL HCL ER 160 MG PO CP24: 160.0000 mg | ORAL_CAPSULE | Freq: Every day | ORAL | 3 refills | Status: AC

## 2024-05-22 MED ORDER — AMIODARONE HCL 100 MG PO TABS: 100.0000 mg | ORAL_TABLET | Freq: Every day | ORAL | 2 refills | Status: AC

## 2024-05-22 NOTE — Patient Instructions (Addendum)
 Medication Instructions:   Continue all current medications.   Labwork:  TSH, CMET - orders given today  Testing/Procedures:  none  Follow-Up:  Office will contact with results via phone, letter or mychart.    6 months   Any Other Special Instructions Will Be Listed Below (If Applicable).   If you need a refill on your cardiac medications before your next appointment, please call your pharmacy.

## 2024-05-22 NOTE — Progress Notes (Signed)
 Electrophysiology Office Note:    Date:  05/22/2024   ID:  William Orr, DOB March 18, 1946, MRN [id number]  PCP:  Maree Isles, MD   [facility] Providers Cardiologist:  Debby Sor, MD (Inactive) Electrophysiologist:  Eulas FORBES Furbish, MD     Referring MD: Maree Isles, MD   History of Present Illness:    William Orr is a 78 y.o. male with a hx listed below, significant for CAD status post coronary bypass in 2018, diabetes, hypertension, hyperlipidemia, frequent PVCs  referred for arrhythmia management.  He has had skipped beats and irregular heart rhythms for years, but they have gotten worse over the past several months.  He thinks change may have occurred after he started Ozempic.  During episodes, he has palpitations, shortness of breath, fatigue.  I reviewed strips from his cardia mobile device again today.  These showed occasional PVCs and correlated with his symptoms.  We began propranolol  and magnesium  taurate but it has had little effect. He continues to have palpitations.  He occasionally has chest discomfort associated with PVCs, not with exertion.  He has been using a Kardia mobile device to check his heart rhythm.  Prior to starting amiodarone , all readings showed frequent PVCs.  Since starting amiodarone , he has had rare recordings that have detected PVCs.  Amiodarone  was decreased to 100 mg daily, and he continues to do well.  He has noticed some increased shortness of breath however.  EKGs/Labs/Other Studies Reviewed Today:    Echocardiogram:  TTE 11/08/2022 EF 60-65%, mildly dilated LA. RV is mildly dilated   Monitors:  Zio - 14 days  12/2022 - my intepretation Sinus rhythm, heart rate 58 to 91 bpm, average 71 bpm.  18.7% PVCs.  Patient triggered events correlated with PVCs.     Zio 14 days 08/2021 -- reviewed       4.9% PVCs  Stress testing:  10/28/2021 Spect Low risk  Advanced imaging:   Cardiac catherization   EKG:         Recent Labs: 09/18/2023: TSH 1.920 04/30/2024: BUN 14; Creatinine, Ser 1.07; Hemoglobin 14.1; Platelets 193; Potassium 3.5; Sodium 140     Physical Exam:    VS:  BP (!) 146/72 (BP Location: Left Arm)   Pulse 67   Ht 6' 2 (1.88 m)   Wt 243 lb (110.2 kg)   SpO2 97%   BMI 31.20 kg/m     Wt Readings from Last 3 Encounters:  05/22/24 243 lb (110.2 kg)  04/30/24 240 lb (108.9 kg)  11/14/23 240 lb 6.4 oz (109 kg)     GEN: Well nourished, well developed in no acute distress CARDIAC: RRR, no murmurs, rubs, gallops RESPIRATORY:  Normal work of breathing MUSCULOSKELETAL: no edema    ASSESSMENT & PLAN:    Frequent PVCs: Burden has been up to 30%  Symptomatic with fatigue, palpitations; EF is normal.  Symptoms somewhat better with propranolol  Will try to eradicate PVCs to improve symptoms.  He has not had significant benefit from magnesium  and propranolol  Decrease amiodarone  to 100mg  daily -- he may increase back to 200 mg if PVCs return Check TSH, LFTs, CXR (he has noticed increased shortness of breath)  CAD S/p CABG 2018 Continue aspirin  81, fish oil.  He is intolerant to statins.  DM He reports ozempic has helped with BG control  HTN BP controlled today.           Medication Adjustments/Labs and Tests Ordered: Current medicines are reviewed at length with the patient  today.  Concerns regarding medicines are outlined above.  No orders of the defined types were placed in this encounter.  Meds ordered this encounter  Medications   amiodarone  (PACERONE ) 100 MG tablet    Sig: Take 1 tablet (100 mg total) by mouth daily.    Dispense:  90 tablet    Refill:  2   propranolol  ER (INDERAL  LA) 160 MG SR capsule    Sig: Take 1 capsule (160 mg total) by mouth daily.    Dispense:  90 capsule    Refill:  3     Signed, Eulas FORBES Furbish, MD  05/22/2024 9:46 AM    [facility]

## 2024-05-23 LAB — COMPREHENSIVE METABOLIC PANEL WITH GFR
ALT: 23 IU/L (ref 0–44)
AST: 22 IU/L (ref 0–40)
Albumin: 4.4 g/dL (ref 3.8–4.8)
Alkaline Phosphatase: 84 IU/L (ref 47–123)
BUN/Creatinine Ratio: 13 (ref 10–24)
BUN: 13 mg/dL (ref 8–27)
Bilirubin Total: 0.5 mg/dL (ref 0.0–1.2)
CO2: 27 mmol/L (ref 20–29)
Calcium: 10 mg/dL (ref 8.6–10.2)
Chloride: 98 mmol/L (ref 96–106)
Creatinine, Ser: 1.04 mg/dL (ref 0.76–1.27)
Globulin, Total: 2.9 g/dL (ref 1.5–4.5)
Glucose: 103 mg/dL — ABNORMAL HIGH (ref 70–99)
Potassium: 4 mmol/L (ref 3.5–5.2)
Sodium: 140 mmol/L (ref 134–144)
Total Protein: 7.3 g/dL (ref 6.0–8.5)
eGFR: 74 mL/min/1.73 (ref >59)

## 2024-05-23 LAB — TSH: TSH: 2.07 u[IU]/mL (ref 0.450–4.500)

## 2024-05-27 ENCOUNTER — Ambulatory Visit: Payer: Self-pay

## 2024-06-09 DIAGNOSIS — I493 Ventricular premature depolarization: Secondary | ICD-10-CM | POA: Diagnosis not present

## 2024-06-09 DIAGNOSIS — I25119 Atherosclerotic heart disease of native coronary artery with unspecified angina pectoris: Secondary | ICD-10-CM | POA: Diagnosis not present

## 2024-06-09 DIAGNOSIS — J309 Allergic rhinitis, unspecified: Secondary | ICD-10-CM | POA: Diagnosis not present

## 2024-06-09 DIAGNOSIS — E113299 Type 2 diabetes mellitus with mild nonproliferative diabetic retinopathy without macular edema, unspecified eye: Secondary | ICD-10-CM | POA: Diagnosis not present

## 2024-06-09 DIAGNOSIS — E785 Hyperlipidemia, unspecified: Secondary | ICD-10-CM | POA: Diagnosis not present

## 2024-06-09 DIAGNOSIS — I10 Essential (primary) hypertension: Secondary | ICD-10-CM | POA: Diagnosis not present

## 2024-06-09 DIAGNOSIS — E1142 Type 2 diabetes mellitus with diabetic polyneuropathy: Secondary | ICD-10-CM | POA: Diagnosis not present

## 2024-06-09 DIAGNOSIS — E669 Obesity, unspecified: Secondary | ICD-10-CM | POA: Diagnosis not present

## 2024-06-09 DIAGNOSIS — K219 Gastro-esophageal reflux disease without esophagitis: Secondary | ICD-10-CM | POA: Diagnosis not present

## 2024-06-11 ENCOUNTER — Ambulatory Visit: Attending: Cardiology | Admitting: Physician Assistant

## 2024-06-11 ENCOUNTER — Encounter: Payer: Self-pay | Admitting: Physician Assistant

## 2024-06-11 VITALS — BP 138/84 | HR 80 | Ht 74.0 in | Wt 242.2 lb

## 2024-06-11 DIAGNOSIS — I10 Essential (primary) hypertension: Secondary | ICD-10-CM

## 2024-06-11 DIAGNOSIS — I493 Ventricular premature depolarization: Secondary | ICD-10-CM

## 2024-06-11 DIAGNOSIS — R0602 Shortness of breath: Secondary | ICD-10-CM | POA: Diagnosis not present

## 2024-06-11 DIAGNOSIS — E119 Type 2 diabetes mellitus without complications: Secondary | ICD-10-CM

## 2024-06-11 DIAGNOSIS — I25810 Atherosclerosis of coronary artery bypass graft(s) without angina pectoris: Secondary | ICD-10-CM

## 2024-06-11 DIAGNOSIS — E785 Hyperlipidemia, unspecified: Secondary | ICD-10-CM

## 2024-06-11 NOTE — Patient Instructions (Signed)
 Medication Instructions:  Your physician recommends that you continue on your current medications as directed. Please refer to the Current Medication list given to you today.  *If you need a refill on your cardiac medications before your next appointment, please call your pharmacy*  Lab Work: NONE ordered at this time of appointment   Testing/Procedures: Cardiac PET Stress test  Follow-Up: At [facility], you and your health needs are our priority.  As part of our continuing mission to provide you with exceptional heart care, our providers are all part of one team.  This team includes your primary Cardiologist (physician) and Advanced Practice Providers or APPs (Physician Assistants and Nurse Practitioners) who all work together to provide you with the care you need, when you need it.  Your next appointment:   6 month(s)  Provider:   Dr. Patwarhan  We recommend signing up for the patient portal called MyChart.  Sign up information is provided on this After Visit Summary.  MyChart is used to connect with patients for Virtual Visits (Telemedicine).  Patients are able to view lab/test results, encounter notes, upcoming appointments, etc.  Non-urgent messages can be sent to your provider as well.   To learn more about what you can do with MyChart, go to [email or web address].   Other Instructions    Please report to Radiology at the [facility] Main Entrance 30 minutes early for your test.  [address] [place], [place] [address]   How to Prepare for Your Cardiac PET/CT Stress Test:  Nothing to eat or drink, except water, 3 hours prior to arrival time.  NO caffeine/decaffeinated products, or chocolate 12 hours prior to arrival. (Please note decaffeinated beverages (teas/coffees) still contain caffeine).  If you have caffeine within 12 hours prior, the test will need to be rescheduled.  Medication instructions: Do not take erectile dysfunction medications  for 72 hours prior to test (sildenafil, tadalafil) Do not take nitrates (isosorbide  mononitrate, Ranexa) the day before or day of test Do not take tamsulosin the day before or morning of test Hold theophylline containing medications for 12 hours. Hold Dipyridamole 48 hours prior to the test.  Diabetic Preparation: If able to eat breakfast prior to 3 hour fasting, you may take all medications, including your insulin . Do not worry if you miss your breakfast dose of insulin  - start at your next meal. If you do not eat prior to 3 hour fast-Hold all diabetes (oral and insulin ) medications. Patients who wear a continuous glucose monitor MUST remove the device prior to scanning.  You may take your remaining medications with water.  NO perfume, cologne or lotion on chest or abdomen area.   Total time is 1 to 2 hours; you may want to bring reading material for the waiting time.  IF YOU THINK YOU MAY BE PREGNANT, OR ARE NURSING PLEASE INFORM THE TECHNOLOGIST.  In preparation for your appointment, medication and supplies will be purchased.  Appointment availability is limited, so if you need to cancel or reschedule, please call the Radiology Department Scheduler at [phone] 24 hours in advance to avoid a cancellation fee of $100.00  What to Expect When you Arrive:  Once you arrive and check in for your appointment, you will be taken to a preparation room within the Radiology Department.  A technologist or Nurse will obtain your medical history, verify that you are correctly prepped for the exam, and explain the procedure.  Afterwards, an IV will be started in your arm and  electrodes will be placed on your skin for EKG monitoring during the stress portion of the exam. Then you will be escorted to the PET/CT scanner.  There, staff will get you positioned on the scanner and obtain a blood pressure and EKG.  During the exam, you will continue to be connected to the EKG and blood pressure machines.  A  small, safe amount of a radioactive tracer will be injected in your IV to obtain a series of pictures of your heart along with an injection of a stress agent.    After your Exam:  It is recommended that you eat a meal and drink a caffeinated beverage to counter act any effects of the stress agent.  Drink plenty of fluids for the remainder of the day and urinate frequently for the first couple of hours after the exam.  Your doctor will inform you of your test results within 7-10 business days.  For more information and frequently asked questions, please visit our website: [email or web address]  For questions about your test or how to prepare for your test, please call: Cardiac Imaging Nurse Navigators Office: [phone]

## 2024-06-11 NOTE — Progress Notes (Unsigned)
  Cardiology Office Note   Date:  06/11/2024  ID:  William Orr, DOB January 31, 1946, MRN [id number] PCP: Maree Isles, MD  [facility] Providers Cardiologist:  Debby Sor, MD (Inactive) Electrophysiologist:  Eulas FORBES Furbish, MD { Click to update primary MD,subspecialty MD or APP then REFRESH:1}    History of Present Illness William Orr is a 78 y.o. male with a hx of CAD s/p CABG 01/04/2017 (LIMA-LAD, SVG-D1, SVG-D2), frequent PVCs (14.1% burden), DM2, hypertension, hyperlipidemia and obstructive sleep apnea.  Myoview  obtained in March 2023 was low risk, no evidence of ischemia, EF 64%. Echocardiogram obtained on 11/07/2021 showed EF 60 to 65%, no regional wall motion abnormality, grade 1 DD, trivial MR. [facility] Pral[facility].  Patient has been established with Dr. Furbish of EP service for frequent PVCs.  He was started on propranolol  in the magnesium  but did not have significant PVC suppression.  He was later switched to amiodarone , since then, PVC burden has been quite low.  Amiodarone  was later reduced to just 100 mg daily.  Recently, patient has been seen in the emergency room at [facility] on 04/30/2024 for atypical chest pain.  Chest x-ray was normal.  Creatinine stable at 1.07, normal electrolyte.  Hemoglobin normal.  Serial troponin negative x 2.  Symptom was felt to be musculoskeletal in nature and he was treated with Toradol .  ROS: ***  Studies Reviewed      *** Risk Assessment/Calculations {Does this patient have ATRIAL FIBRILLATION?[phone]}         Physical Exam VS:  BP 138/84   Pulse 80   Ht 6' 2 (1.88 m)   Wt 242 lb 3.2 oz (109.9 kg)   SpO2 98%   BMI 31.10 kg/m        Wt Readings from Last 3 Encounters:  06/11/24 242 lb 3.2 oz (109.9 kg)  05/22/24 243 lb (110.2 kg)  04/30/24 240 lb (108.9 kg)    GEN: Well nourished, well developed in no acute distress NECK: No JVD; No carotid bruits CARDIAC: ***RRR, no murmurs, rubs,  gallops RESPIRATORY:  Clear to auscultation without rales, wheezing or rhonchi  ABDOMEN: Soft, non-tender, non-distended EXTREMITIES:  No edema; No deformity   ASSESSMENT AND PLAN ***    {Are you ordering a CV Procedure (e.g. stress test, cath, DCCV, TEE, etc)?   Press F2        :[phone]}  Dispo: ***  Signed, Scot Ford, PA

## 2024-06-13 DIAGNOSIS — I10 Essential (primary) hypertension: Secondary | ICD-10-CM | POA: Diagnosis not present

## 2024-06-13 DIAGNOSIS — E119 Type 2 diabetes mellitus without complications: Secondary | ICD-10-CM | POA: Diagnosis not present

## 2024-06-18 DIAGNOSIS — I10 Essential (primary) hypertension: Secondary | ICD-10-CM | POA: Diagnosis not present

## 2024-06-18 DIAGNOSIS — E119 Type 2 diabetes mellitus without complications: Secondary | ICD-10-CM | POA: Diagnosis not present

## 2024-07-03 ENCOUNTER — Encounter (HOSPITAL_COMMUNITY): Payer: Self-pay

## 2024-07-07 ENCOUNTER — Telehealth (HOSPITAL_COMMUNITY): Payer: Self-pay | Admitting: *Deleted

## 2024-07-07 DIAGNOSIS — I10 Essential (primary) hypertension: Secondary | ICD-10-CM | POA: Diagnosis not present

## 2024-07-07 DIAGNOSIS — R079 Chest pain, unspecified: Secondary | ICD-10-CM | POA: Diagnosis not present

## 2024-07-07 DIAGNOSIS — E119 Type 2 diabetes mellitus without complications: Secondary | ICD-10-CM | POA: Diagnosis not present

## 2024-07-07 DIAGNOSIS — R42 Dizziness and giddiness: Secondary | ICD-10-CM | POA: Diagnosis not present

## 2024-07-07 DIAGNOSIS — Z299 Encounter for prophylactic measures, unspecified: Secondary | ICD-10-CM | POA: Diagnosis not present

## 2024-07-07 DIAGNOSIS — R52 Pain, unspecified: Secondary | ICD-10-CM | POA: Diagnosis not present

## 2024-07-07 NOTE — Telephone Encounter (Signed)
 Reaching out to patient to offer assistance regarding upcoming cardiac imaging study; pt verbalizes understanding of appt date/time, parking situation and where to check in, pre-test NPO status and verified current allergies; name and call back number provided for further questions should they arise  Larey Brick RN Navigator Cardiac Imaging [facility] [phone] office [phone] cell  Patient aware to avoid caffeine 12 hours prior to his cardiac PET scan.

## 2024-07-08 ENCOUNTER — Other Ambulatory Visit (HOSPITAL_COMMUNITY): Payer: Self-pay | Admitting: Physician Assistant

## 2024-07-08 ENCOUNTER — Ambulatory Visit (HOSPITAL_COMMUNITY)
Admission: RE | Admit: 2024-07-08 | Discharge: 2024-07-08 | Disposition: A | Discharge status: Home / Self Care | Source: Ambulatory Visit | Attending: Physician Assistant | Admitting: Physician Assistant

## 2024-07-08 DIAGNOSIS — R0602 Shortness of breath: Secondary | ICD-10-CM | POA: Diagnosis not present

## 2024-07-08 LAB — NM PET CT CARDIAC PERFUSION MULTI W/ABSOLUTE BLOODFLOW
LV dias vol: 104 mL (ref 62–150)
LV sys vol: 38 mL (ref 4.2–5.8)
MBFR: 2.47
Nuc Rest EF: 63 %
Nuc Stress EF: 64 %
Rest MBF: 0.64 ml/g/min
Rest Nuclear Isotope Dose: 28.1 mCi
ST Depression (mm): 0 mm
Stress MBF: 1.58 ml/g/min
Stress Nuclear Isotope Dose: 28.3 mCi

## 2024-07-08 MED ORDER — REGADENOSON 0.4 MG/5ML IV SOLN
INTRAVENOUS | Status: AC
  Filled 2024-07-08: qty 5

## 2024-07-08 MED ORDER — REGADENOSON 0.4 MG/5ML IV SOLN
0.4000 mg | Freq: Once | INTRAVENOUS | Status: AC
  Administered 2024-07-08: 0.4 mg via INTRAVENOUS

## 2024-07-08 MED ORDER — RUBIDIUM RB82 GENERATOR (RUBYFILL)
28.1200 | PACK | Freq: Once | INTRAVENOUS | Status: AC
  Administered 2024-07-08: 28.12 via INTRAVENOUS

## 2024-07-08 MED ORDER — RUBIDIUM RB82 GENERATOR (RUBYFILL)
28.3300 | PACK | Freq: Once | INTRAVENOUS | Status: AC
  Administered 2024-07-08: 28.33 via INTRAVENOUS

## 2024-07-08 NOTE — Progress Notes (Signed)
 Pt. Tolerated lexi scan well.

## 2024-07-11 ENCOUNTER — Ambulatory Visit: Payer: Self-pay | Admitting: Physician Assistant

## 2024-07-13 DIAGNOSIS — I10 Essential (primary) hypertension: Secondary | ICD-10-CM | POA: Diagnosis not present

## 2024-07-13 DIAGNOSIS — E119 Type 2 diabetes mellitus without complications: Secondary | ICD-10-CM | POA: Diagnosis not present

## 2024-07-15 NOTE — Telephone Encounter (Addendum)
 Called patient regarding results, patient verbalized understanding   ----- Message from [facility] sent at 07/11/2024  4:20 PM EST ----- Normal stress test. Normal pumping function of heart. No significant blockage. Overall, normal stress test ----- Message ----- From: Delford Maude BROCKS, MD Sent: 07/08/2024   2:51 PM EST To: Scot Ford, PA

## 2024-07-18 ENCOUNTER — Ambulatory Visit: Payer: Self-pay | Admitting: Physician Assistant

## 2024-07-18 DIAGNOSIS — I10 Essential (primary) hypertension: Secondary | ICD-10-CM | POA: Diagnosis not present

## 2024-07-18 DIAGNOSIS — E119 Type 2 diabetes mellitus without complications: Secondary | ICD-10-CM | POA: Diagnosis not present

## 2024-07-18 NOTE — Progress Notes (Signed)
 Extra-cardiac portion was normal. There are gallstones, but no sign of inflammation.

## 2024-07-21 DIAGNOSIS — E119 Type 2 diabetes mellitus without complications: Secondary | ICD-10-CM | POA: Diagnosis not present

## 2024-07-21 DIAGNOSIS — I10 Essential (primary) hypertension: Secondary | ICD-10-CM | POA: Diagnosis not present

## 2024-07-28 ENCOUNTER — Telehealth: Payer: Self-pay | Admitting: Cardiovascular Disease

## 2024-07-28 MED ORDER — AMLODIPINE BESYLATE 5 MG PO TABS: 5.0000 mg | ORAL_TABLET | Freq: Every day | ORAL | 3 refills | Status: AC

## 2024-07-28 NOTE — Telephone Encounter (Signed)
 Refill sent

## 2024-07-28 NOTE — Telephone Encounter (Signed)
°*  STAT* If patient is at the pharmacy, call can be transferred to refill team.   1. Which medications need to be refilled? (please list name of each medication and dose if known)   amLODipine  (NORVASC ) 5 MG tablet (    4. Which pharmacy/location (including street and city if local pharmacy) is medication to be sent to?  [facility] - [place], [place] - [address]     5. Do they need a 30 day or 90 day supply? 90
